# Patient Record
Sex: Female | Born: 1975
Health system: Southern US, Community
[De-identification: ages and names within clinical notes are randomized; demographics above are authoritative.]

## PROBLEM LIST (undated history)

## (undated) ENCOUNTER — Emergency Department (HOSPITAL_COMMUNITY): Admission: EM | Payer: 59 | Source: Home / Self Care

## (undated) DIAGNOSIS — IMO0002 Reserved for concepts with insufficient information to code with codable children: Secondary | ICD-10-CM

## (undated) DIAGNOSIS — Z8619 Personal history of other infectious and parasitic diseases: Secondary | ICD-10-CM

## (undated) DIAGNOSIS — F419 Anxiety disorder, unspecified: Secondary | ICD-10-CM

## (undated) DIAGNOSIS — R87619 Unspecified abnormal cytological findings in specimens from cervix uteri: Secondary | ICD-10-CM

## (undated) DIAGNOSIS — Z789 Other specified health status: Secondary | ICD-10-CM

## (undated) DIAGNOSIS — A609 Anogenital herpesviral infection, unspecified: Secondary | ICD-10-CM

## (undated) HISTORY — DX: Anogenital herpesviral infection, unspecified: A60.9

## (undated) HISTORY — DX: Anxiety disorder, unspecified: F41.9

## (undated) HISTORY — DX: Personal history of other infectious and parasitic diseases: Z86.19

## (undated) HISTORY — PX: NO PAST SURGERIES: SHX2092

## (undated) HISTORY — PX: DILATION AND CURETTAGE OF UTERUS: SHX78

---

## 1997-12-18 ENCOUNTER — Encounter: Admission: RE | Admit: 1997-12-18 | Discharge: 1997-12-18 | Payer: Self-pay | Admitting: Family Medicine

## 1998-01-01 ENCOUNTER — Encounter: Admission: RE | Admit: 1998-01-01 | Discharge: 1998-01-01 | Payer: Self-pay | Admitting: Family Medicine

## 1998-01-22 ENCOUNTER — Encounter: Admission: RE | Admit: 1998-01-22 | Discharge: 1998-01-22 | Payer: Self-pay | Admitting: Family Medicine

## 1998-04-25 ENCOUNTER — Encounter: Admission: RE | Admit: 1998-04-25 | Discharge: 1998-04-25 | Payer: Self-pay | Admitting: Family Medicine

## 1998-05-07 ENCOUNTER — Encounter: Admission: RE | Admit: 1998-05-07 | Discharge: 1998-05-07 | Payer: Self-pay | Admitting: Sports Medicine

## 1998-05-16 ENCOUNTER — Encounter: Admission: RE | Admit: 1998-05-16 | Discharge: 1998-05-16 | Payer: Self-pay | Admitting: Family Medicine

## 1998-10-10 ENCOUNTER — Encounter: Admission: RE | Admit: 1998-10-10 | Discharge: 1998-10-10 | Payer: Self-pay | Admitting: Family Medicine

## 1999-01-07 ENCOUNTER — Encounter: Admission: RE | Admit: 1999-01-07 | Discharge: 1999-01-07 | Payer: Self-pay | Admitting: Family Medicine

## 1999-01-14 ENCOUNTER — Encounter: Admission: RE | Admit: 1999-01-14 | Discharge: 1999-01-14 | Payer: Self-pay | Admitting: Family Medicine

## 2000-03-03 ENCOUNTER — Encounter (INDEPENDENT_AMBULATORY_CARE_PROVIDER_SITE_OTHER): Payer: Self-pay | Admitting: Specialist

## 2000-03-03 ENCOUNTER — Other Ambulatory Visit: Admission: RE | Admit: 2000-03-03 | Discharge: 2000-03-03 | Payer: Self-pay | Admitting: *Deleted

## 2000-10-20 ENCOUNTER — Inpatient Hospital Stay (HOSPITAL_COMMUNITY): Admission: AD | Admit: 2000-10-20 | Discharge: 2000-10-20 | Payer: Self-pay | Admitting: Obstetrics

## 2000-12-08 ENCOUNTER — Emergency Department (HOSPITAL_COMMUNITY): Admission: EM | Admit: 2000-12-08 | Discharge: 2000-12-08 | Payer: Self-pay

## 2001-03-23 ENCOUNTER — Emergency Department (HOSPITAL_COMMUNITY): Admission: EM | Admit: 2001-03-23 | Discharge: 2001-03-23 | Payer: Self-pay | Admitting: Emergency Medicine

## 2001-04-06 ENCOUNTER — Emergency Department (HOSPITAL_COMMUNITY): Admission: EM | Admit: 2001-04-06 | Discharge: 2001-04-06 | Payer: Self-pay

## 2001-04-28 ENCOUNTER — Encounter: Admission: RE | Admit: 2001-04-28 | Discharge: 2001-04-28 | Payer: Self-pay | Admitting: Obstetrics

## 2001-07-28 ENCOUNTER — Encounter: Admission: RE | Admit: 2001-07-28 | Discharge: 2001-07-28 | Payer: Self-pay | Admitting: *Deleted

## 2001-08-02 ENCOUNTER — Inpatient Hospital Stay (HOSPITAL_COMMUNITY): Admission: AD | Admit: 2001-08-02 | Discharge: 2001-08-02 | Payer: Self-pay | Admitting: *Deleted

## 2001-08-02 ENCOUNTER — Encounter: Payer: Self-pay | Admitting: *Deleted

## 2001-08-16 ENCOUNTER — Other Ambulatory Visit: Admission: RE | Admit: 2001-08-16 | Discharge: 2001-08-16 | Payer: Self-pay | Admitting: Obstetrics and Gynecology

## 2001-12-07 ENCOUNTER — Inpatient Hospital Stay (HOSPITAL_COMMUNITY): Admission: AD | Admit: 2001-12-07 | Discharge: 2001-12-07 | Payer: Self-pay | Admitting: Obstetrics and Gynecology

## 2001-12-09 ENCOUNTER — Emergency Department (HOSPITAL_COMMUNITY): Admission: EM | Admit: 2001-12-09 | Discharge: 2001-12-09 | Payer: Self-pay | Admitting: *Deleted

## 2001-12-09 ENCOUNTER — Encounter: Payer: Self-pay | Admitting: *Deleted

## 2002-01-24 ENCOUNTER — Emergency Department (HOSPITAL_COMMUNITY): Admission: EM | Admit: 2002-01-24 | Discharge: 2002-01-24 | Payer: Self-pay | Admitting: Emergency Medicine

## 2002-02-20 ENCOUNTER — Inpatient Hospital Stay (HOSPITAL_COMMUNITY): Admission: AD | Admit: 2002-02-20 | Discharge: 2002-02-20 | Payer: Self-pay | Admitting: Obstetrics and Gynecology

## 2002-02-25 ENCOUNTER — Inpatient Hospital Stay (HOSPITAL_COMMUNITY): Admission: AD | Admit: 2002-02-25 | Discharge: 2002-02-25 | Payer: Self-pay | Admitting: Obstetrics and Gynecology

## 2002-03-23 ENCOUNTER — Inpatient Hospital Stay (HOSPITAL_COMMUNITY): Admission: AD | Admit: 2002-03-23 | Discharge: 2002-03-23 | Payer: Self-pay | Admitting: Obstetrics and Gynecology

## 2002-03-23 ENCOUNTER — Inpatient Hospital Stay (HOSPITAL_COMMUNITY): Admission: AD | Admit: 2002-03-23 | Discharge: 2002-03-26 | Payer: Self-pay | Admitting: Obstetrics and Gynecology

## 2002-08-15 ENCOUNTER — Encounter: Admission: RE | Admit: 2002-08-15 | Discharge: 2002-08-15 | Payer: Self-pay | Admitting: Sports Medicine

## 2002-08-17 ENCOUNTER — Encounter (INDEPENDENT_AMBULATORY_CARE_PROVIDER_SITE_OTHER): Payer: Self-pay | Admitting: *Deleted

## 2003-02-01 ENCOUNTER — Emergency Department (HOSPITAL_COMMUNITY): Admission: EM | Admit: 2003-02-01 | Discharge: 2003-02-01 | Payer: Self-pay | Admitting: Emergency Medicine

## 2003-04-05 ENCOUNTER — Inpatient Hospital Stay (HOSPITAL_COMMUNITY): Admission: AD | Admit: 2003-04-05 | Discharge: 2003-04-05 | Payer: Self-pay | Admitting: Obstetrics & Gynecology

## 2003-12-03 ENCOUNTER — Emergency Department (HOSPITAL_COMMUNITY): Admission: EM | Admit: 2003-12-03 | Discharge: 2003-12-04 | Payer: Self-pay | Admitting: Emergency Medicine

## 2003-12-04 ENCOUNTER — Inpatient Hospital Stay (HOSPITAL_COMMUNITY): Admission: AD | Admit: 2003-12-04 | Discharge: 2003-12-04 | Payer: Self-pay | Admitting: *Deleted

## 2003-12-07 ENCOUNTER — Encounter: Payer: Self-pay | Admitting: Obstetrics and Gynecology

## 2003-12-07 ENCOUNTER — Ambulatory Visit (HOSPITAL_COMMUNITY): Admission: RE | Admit: 2003-12-07 | Discharge: 2003-12-07 | Payer: Self-pay | Admitting: Obstetrics and Gynecology

## 2003-12-07 ENCOUNTER — Encounter (INDEPENDENT_AMBULATORY_CARE_PROVIDER_SITE_OTHER): Payer: Self-pay | Admitting: Specialist

## 2004-02-21 ENCOUNTER — Inpatient Hospital Stay (HOSPITAL_COMMUNITY): Admission: AD | Admit: 2004-02-21 | Discharge: 2004-02-22 | Payer: Self-pay | Admitting: *Deleted

## 2004-03-13 ENCOUNTER — Ambulatory Visit: Payer: Self-pay | Admitting: Family Medicine

## 2004-04-15 ENCOUNTER — Inpatient Hospital Stay (HOSPITAL_COMMUNITY): Admission: AD | Admit: 2004-04-15 | Discharge: 2004-04-16 | Payer: Self-pay | Admitting: Obstetrics and Gynecology

## 2004-05-02 ENCOUNTER — Other Ambulatory Visit: Admission: RE | Admit: 2004-05-02 | Discharge: 2004-05-02 | Payer: Self-pay | Admitting: Obstetrics and Gynecology

## 2004-05-06 ENCOUNTER — Emergency Department (HOSPITAL_COMMUNITY): Admission: EM | Admit: 2004-05-06 | Discharge: 2004-05-06 | Payer: Self-pay | Admitting: Emergency Medicine

## 2004-05-21 ENCOUNTER — Inpatient Hospital Stay (HOSPITAL_COMMUNITY): Admission: AD | Admit: 2004-05-21 | Discharge: 2004-05-21 | Payer: Self-pay | Admitting: Obstetrics and Gynecology

## 2004-06-11 ENCOUNTER — Inpatient Hospital Stay (HOSPITAL_COMMUNITY): Admission: AD | Admit: 2004-06-11 | Discharge: 2004-06-12 | Payer: Self-pay | Admitting: Obstetrics and Gynecology

## 2004-07-02 ENCOUNTER — Inpatient Hospital Stay (HOSPITAL_COMMUNITY): Admission: AD | Admit: 2004-07-02 | Discharge: 2004-07-02 | Payer: Self-pay | Admitting: Obstetrics and Gynecology

## 2004-07-15 ENCOUNTER — Inpatient Hospital Stay (HOSPITAL_COMMUNITY): Admission: AD | Admit: 2004-07-15 | Discharge: 2004-07-15 | Payer: Self-pay | Admitting: Obstetrics and Gynecology

## 2004-07-16 ENCOUNTER — Inpatient Hospital Stay (HOSPITAL_COMMUNITY): Admission: AD | Admit: 2004-07-16 | Discharge: 2004-07-16 | Payer: Self-pay | Admitting: Obstetrics and Gynecology

## 2004-07-17 ENCOUNTER — Inpatient Hospital Stay (HOSPITAL_COMMUNITY): Admission: AD | Admit: 2004-07-17 | Discharge: 2004-07-18 | Payer: Self-pay | Admitting: Obstetrics and Gynecology

## 2004-07-23 ENCOUNTER — Emergency Department (HOSPITAL_COMMUNITY): Admission: EM | Admit: 2004-07-23 | Discharge: 2004-07-23 | Payer: Self-pay | Admitting: Emergency Medicine

## 2004-07-26 ENCOUNTER — Emergency Department (HOSPITAL_COMMUNITY): Admission: EM | Admit: 2004-07-26 | Discharge: 2004-07-26 | Payer: Self-pay | Admitting: Emergency Medicine

## 2004-08-06 ENCOUNTER — Inpatient Hospital Stay (HOSPITAL_COMMUNITY): Admission: AD | Admit: 2004-08-06 | Discharge: 2004-08-07 | Payer: Self-pay | Admitting: Obstetrics and Gynecology

## 2004-09-05 ENCOUNTER — Inpatient Hospital Stay (HOSPITAL_COMMUNITY): Admission: AD | Admit: 2004-09-05 | Discharge: 2004-09-06 | Payer: Self-pay | Admitting: Obstetrics and Gynecology

## 2004-09-09 ENCOUNTER — Inpatient Hospital Stay (HOSPITAL_COMMUNITY): Admission: AD | Admit: 2004-09-09 | Discharge: 2004-09-09 | Payer: Self-pay | Admitting: Obstetrics and Gynecology

## 2004-09-15 ENCOUNTER — Ambulatory Visit (HOSPITAL_COMMUNITY): Admission: RE | Admit: 2004-09-15 | Discharge: 2004-09-15 | Payer: Self-pay | Admitting: Obstetrics and Gynecology

## 2004-09-29 ENCOUNTER — Inpatient Hospital Stay (HOSPITAL_COMMUNITY): Admission: AD | Admit: 2004-09-29 | Discharge: 2004-10-01 | Payer: Self-pay | Admitting: Obstetrics and Gynecology

## 2005-12-17 ENCOUNTER — Other Ambulatory Visit: Admission: RE | Admit: 2005-12-17 | Discharge: 2005-12-17 | Payer: Self-pay | Admitting: Obstetrics and Gynecology

## 2006-08-13 ENCOUNTER — Encounter (INDEPENDENT_AMBULATORY_CARE_PROVIDER_SITE_OTHER): Payer: Self-pay | Admitting: *Deleted

## 2006-10-29 ENCOUNTER — Inpatient Hospital Stay (HOSPITAL_COMMUNITY): Admission: AD | Admit: 2006-10-29 | Discharge: 2006-10-29 | Payer: Self-pay | Admitting: Family Medicine

## 2006-12-07 ENCOUNTER — Ambulatory Visit: Payer: Self-pay | Admitting: Family Medicine

## 2006-12-07 ENCOUNTER — Encounter: Payer: Self-pay | Admitting: Family Medicine

## 2006-12-07 ENCOUNTER — Other Ambulatory Visit: Admission: RE | Admit: 2006-12-07 | Discharge: 2006-12-07 | Payer: Self-pay | Admitting: Family Medicine

## 2006-12-07 DIAGNOSIS — R5383 Other fatigue: Secondary | ICD-10-CM

## 2006-12-07 DIAGNOSIS — R5381 Other malaise: Secondary | ICD-10-CM | POA: Insufficient documentation

## 2006-12-07 LAB — CONVERTED CEMR LAB
ALT: 11 units/L (ref 0–35)
BUN: 7 mg/dL (ref 6–23)
CO2: 24 meq/L (ref 19–32)
Chloride: 103 meq/L (ref 96–112)
Creatinine, Ser: 0.78 mg/dL (ref 0.40–1.20)
Glucose, Bld: 84 mg/dL (ref 70–99)
Potassium: 4.2 meq/L (ref 3.5–5.3)
TSH: 1.305 microintl units/mL (ref 0.350–5.50)
Total Protein: 7 g/dL (ref 6.0–8.3)

## 2006-12-09 ENCOUNTER — Encounter: Payer: Self-pay | Admitting: Family Medicine

## 2007-01-03 ENCOUNTER — Telehealth (INDEPENDENT_AMBULATORY_CARE_PROVIDER_SITE_OTHER): Payer: Self-pay | Admitting: *Deleted

## 2007-01-19 ENCOUNTER — Encounter: Payer: Self-pay | Admitting: Family Medicine

## 2007-01-19 ENCOUNTER — Ambulatory Visit: Payer: Self-pay | Admitting: Family Medicine

## 2007-01-24 ENCOUNTER — Telehealth (INDEPENDENT_AMBULATORY_CARE_PROVIDER_SITE_OTHER): Payer: Self-pay | Admitting: *Deleted

## 2007-01-25 ENCOUNTER — Encounter: Payer: Self-pay | Admitting: Family Medicine

## 2007-01-25 ENCOUNTER — Ambulatory Visit: Payer: Self-pay | Admitting: Family Medicine

## 2007-01-25 LAB — CONVERTED CEMR LAB
Chlamydia, DNA Probe: NEGATIVE
GC Probe Amp, Genital: NEGATIVE
Whiff Test: NEGATIVE

## 2007-01-27 ENCOUNTER — Encounter: Payer: Self-pay | Admitting: Family Medicine

## 2007-02-01 ENCOUNTER — Ambulatory Visit: Payer: Self-pay | Admitting: Family Medicine

## 2007-02-01 DIAGNOSIS — R8789 Other abnormal findings in specimens from female genital organs: Secondary | ICD-10-CM | POA: Insufficient documentation

## 2007-02-08 ENCOUNTER — Encounter (INDEPENDENT_AMBULATORY_CARE_PROVIDER_SITE_OTHER): Payer: Self-pay | Admitting: *Deleted

## 2007-02-10 ENCOUNTER — Ambulatory Visit: Payer: Self-pay | Admitting: Family Medicine

## 2007-03-09 ENCOUNTER — Ambulatory Visit: Payer: Self-pay | Admitting: Family Medicine

## 2007-03-30 ENCOUNTER — Emergency Department (HOSPITAL_COMMUNITY): Admission: EM | Admit: 2007-03-30 | Discharge: 2007-03-30 | Payer: Self-pay | Admitting: Emergency Medicine

## 2007-03-31 ENCOUNTER — Inpatient Hospital Stay (HOSPITAL_COMMUNITY): Admission: EM | Admit: 2007-03-31 | Discharge: 2007-04-03 | Payer: Self-pay | Admitting: Emergency Medicine

## 2007-04-05 ENCOUNTER — Encounter (HOSPITAL_COMMUNITY): Admission: RE | Admit: 2007-04-05 | Discharge: 2007-06-27 | Payer: Self-pay | Admitting: Emergency Medicine

## 2007-07-15 ENCOUNTER — Telehealth: Payer: Self-pay | Admitting: *Deleted

## 2007-07-15 ENCOUNTER — Ambulatory Visit: Payer: Self-pay | Admitting: Family Medicine

## 2007-08-17 ENCOUNTER — Telehealth: Payer: Self-pay | Admitting: *Deleted

## 2007-08-22 ENCOUNTER — Ambulatory Visit: Payer: Self-pay | Admitting: Family Medicine

## 2007-08-22 LAB — CONVERTED CEMR LAB: Beta hcg, urine, semiquantitative: POSITIVE

## 2007-09-28 ENCOUNTER — Ambulatory Visit: Payer: Self-pay | Admitting: Family Medicine

## 2007-10-06 ENCOUNTER — Telehealth (INDEPENDENT_AMBULATORY_CARE_PROVIDER_SITE_OTHER): Payer: Self-pay | Admitting: Family Medicine

## 2007-10-21 ENCOUNTER — Telehealth (INDEPENDENT_AMBULATORY_CARE_PROVIDER_SITE_OTHER): Payer: Self-pay | Admitting: *Deleted

## 2007-10-24 ENCOUNTER — Ambulatory Visit: Payer: Self-pay | Admitting: Family Medicine

## 2007-10-24 LAB — CONVERTED CEMR LAB: Beta hcg, urine, semiquantitative: NEGATIVE

## 2007-10-25 ENCOUNTER — Telehealth: Payer: Self-pay | Admitting: *Deleted

## 2007-10-31 ENCOUNTER — Encounter: Payer: Self-pay | Admitting: *Deleted

## 2007-12-12 ENCOUNTER — Encounter: Payer: Self-pay | Admitting: *Deleted

## 2007-12-25 ENCOUNTER — Emergency Department (HOSPITAL_COMMUNITY): Admission: EM | Admit: 2007-12-25 | Discharge: 2007-12-25 | Payer: Self-pay | Admitting: Family Medicine

## 2008-02-06 ENCOUNTER — Telehealth: Payer: Self-pay | Admitting: *Deleted

## 2008-02-06 ENCOUNTER — Ambulatory Visit: Payer: Self-pay | Admitting: Sports Medicine

## 2008-02-06 ENCOUNTER — Encounter: Payer: Self-pay | Admitting: Family Medicine

## 2008-02-07 LAB — CONVERTED CEMR LAB
Chlamydia, DNA Probe: NEGATIVE
GC Probe Amp, Genital: NEGATIVE

## 2008-02-13 ENCOUNTER — Encounter: Payer: Self-pay | Admitting: Family Medicine

## 2008-02-13 ENCOUNTER — Ambulatory Visit: Payer: Self-pay | Admitting: Family Medicine

## 2008-02-13 ENCOUNTER — Other Ambulatory Visit: Admission: RE | Admit: 2008-02-13 | Discharge: 2008-02-13 | Payer: Self-pay | Admitting: Family Medicine

## 2008-02-21 ENCOUNTER — Telehealth: Payer: Self-pay | Admitting: *Deleted

## 2008-02-23 ENCOUNTER — Encounter: Payer: Self-pay | Admitting: Family Medicine

## 2008-02-26 ENCOUNTER — Emergency Department (HOSPITAL_COMMUNITY): Admission: EM | Admit: 2008-02-26 | Discharge: 2008-02-26 | Payer: Self-pay | Admitting: Family Medicine

## 2008-04-03 ENCOUNTER — Telehealth (INDEPENDENT_AMBULATORY_CARE_PROVIDER_SITE_OTHER): Payer: Self-pay | Admitting: *Deleted

## 2008-04-05 ENCOUNTER — Ambulatory Visit: Payer: Self-pay | Admitting: Family Medicine

## 2008-04-05 ENCOUNTER — Telehealth (INDEPENDENT_AMBULATORY_CARE_PROVIDER_SITE_OTHER): Payer: Self-pay | Admitting: *Deleted

## 2008-06-15 HISTORY — PX: COLPOSCOPY: SHX161

## 2009-02-22 ENCOUNTER — Telehealth: Payer: Self-pay | Admitting: *Deleted

## 2009-02-28 ENCOUNTER — Emergency Department (HOSPITAL_COMMUNITY): Admission: EM | Admit: 2009-02-28 | Discharge: 2009-02-28 | Payer: Self-pay | Admitting: Emergency Medicine

## 2009-04-04 ENCOUNTER — Other Ambulatory Visit: Admission: RE | Admit: 2009-04-04 | Discharge: 2009-04-04 | Payer: Self-pay | Admitting: Family Medicine

## 2009-04-04 ENCOUNTER — Encounter: Payer: Self-pay | Admitting: Family Medicine

## 2009-04-04 ENCOUNTER — Ambulatory Visit: Payer: Self-pay | Admitting: Family Medicine

## 2009-04-05 ENCOUNTER — Encounter: Payer: Self-pay | Admitting: Family Medicine

## 2009-04-17 ENCOUNTER — Telehealth: Payer: Self-pay | Admitting: Family Medicine

## 2009-04-17 ENCOUNTER — Ambulatory Visit: Payer: Self-pay | Admitting: Family Medicine

## 2009-05-08 ENCOUNTER — Telehealth: Payer: Self-pay | Admitting: Family Medicine

## 2009-07-10 ENCOUNTER — Emergency Department (HOSPITAL_COMMUNITY): Admission: EM | Admit: 2009-07-10 | Discharge: 2009-07-10 | Payer: Self-pay | Admitting: Emergency Medicine

## 2009-08-16 ENCOUNTER — Telehealth: Payer: Self-pay | Admitting: Family Medicine

## 2009-11-18 ENCOUNTER — Telehealth: Payer: Self-pay | Admitting: Family Medicine

## 2009-12-25 ENCOUNTER — Ambulatory Visit: Payer: Self-pay | Admitting: Family Medicine

## 2009-12-25 ENCOUNTER — Telehealth: Payer: Self-pay | Admitting: Family Medicine

## 2010-02-05 ENCOUNTER — Telehealth: Payer: Self-pay | Admitting: Family Medicine

## 2010-02-06 ENCOUNTER — Ambulatory Visit: Payer: Self-pay | Admitting: Family Medicine

## 2010-02-06 ENCOUNTER — Encounter: Payer: Self-pay | Admitting: Family Medicine

## 2010-02-06 DIAGNOSIS — N760 Acute vaginitis: Secondary | ICD-10-CM | POA: Insufficient documentation

## 2010-02-06 DIAGNOSIS — N898 Other specified noninflammatory disorders of vagina: Secondary | ICD-10-CM | POA: Insufficient documentation

## 2010-02-06 LAB — CONVERTED CEMR LAB
Beta hcg, urine, semiquantitative: NEGATIVE
Bilirubin Urine: NEGATIVE
Nitrite: NEGATIVE
Protein, U semiquant: NEGATIVE
Urobilinogen, UA: 2
WBC Urine, dipstick: NEGATIVE
Whiff Test: NEGATIVE
pH: 7

## 2010-02-12 ENCOUNTER — Telehealth: Payer: Self-pay | Admitting: Family Medicine

## 2010-07-08 ENCOUNTER — Inpatient Hospital Stay (HOSPITAL_COMMUNITY)
Admission: AD | Admit: 2010-07-08 | Discharge: 2010-07-08 | Payer: Self-pay | Source: Home / Self Care | Attending: Obstetrics & Gynecology | Admitting: Obstetrics & Gynecology

## 2010-07-09 LAB — WET PREP, GENITAL
Trich, Wet Prep: NONE SEEN
Yeast Wet Prep HPF POC: NONE SEEN

## 2010-07-09 LAB — URINALYSIS, ROUTINE W REFLEX MICROSCOPIC
Bilirubin Urine: NEGATIVE
Nitrite: NEGATIVE
Specific Gravity, Urine: <1.005 — ABNORMAL LOW (ref 1.005–1.030)
Urobilinogen, UA: 0.2 mg/dL (ref 0.0–1.0)

## 2010-07-09 LAB — GC/CHLAMYDIA PROBE AMP, GENITAL: Chlamydia, DNA Probe: NEGATIVE

## 2010-07-09 LAB — POCT PREGNANCY, URINE: Preg Test, Ur: POSITIVE

## 2010-07-10 ENCOUNTER — Ambulatory Visit (HOSPITAL_COMMUNITY)
Admission: RE | Admit: 2010-07-10 | Discharge: 2010-07-10 | Payer: Self-pay | Source: Home / Self Care | Attending: Obstetrics & Gynecology | Admitting: Obstetrics & Gynecology

## 2010-07-15 NOTE — Assessment & Plan Note (Signed)
Summary: std check-see notes/[place]   Vital Signs:  Patient profile:   35 year old female LMP:     01/26/2010 Weight:      112.3 pounds Temp:     98.3 degrees F oral Pulse rate:   71 / minute Pulse rhythm:   regular BP sitting:   106 / 68  (left arm) Cuff size:   regular  Vitals Entered By: Loralee Pacas CMA (February 06, 2010 10:11 AM) Comments pt stated that when she started her period last week she used a scented tampon and pantyliner and believes thats why she is irritated LMP (date): 01/26/2010     Enter LMP: 01/26/2010 Last PAP Result NEGATIVE FOR INTRAEPITHELIAL LESIONS OR MALIGNANCY.   Primary Care Provider:  Pearlean Brownie MD   History of Present Illness: Patient seen for work-in appt; recently treated empirically for Chlamydia (with doxy) after boyfriend notified her that he'd been diagnosed.  She acknowledges today that she did not fill the doxy Rx after she learned that her cervical culture was negative.  SHe has had sexual contact with him since he was treated.  Today she complains of vaginal discomfort and discharge, tinged with pink coloration, since Aug 14th.  Had scant vaginal bleeding then.  Was using tampons that are scented and believes she has had external irritation afterward.  No history genital herpes or condyloma.  Denies dysuria, or urinary frequency.  Denies breast tenderness, has had some recent nausea.  Had been on OCPs until about 2 months ago, when she began to forget to take them.  States she does not wish to be pregnant.    Habits & Providers  Alcohol-Tobacco-Diet     Tobacco Status: never     Tobacco Counseling: not indicated; no tobacco use  Current Medications (verified): 1)  Tri-Sprintec 0.035 Mg Tabs (Norgestimate-Ethinyl Estradiol) .Marland Kitchen.. 1 Daily 28 Day Pack  Allergies (verified): No Known Drug Allergies  Physical Exam  General:  well appearing, no apparent distress. Abdomen:  soft, nontender Genitalia:  Normal introitus for  age, no external lesions,fair amount of creamy vaginal discharge, mucosa pink and moist, no vaginal or cervical lesions, no vaginal atrophy, no friaility or hemorrhage. No cervical motion tenderness with insertion swab in os, or with collection wet prep on cervix.   Impression & Recommendations:  Problem # 1:  VAGINAL DISCHARGE (ICD-623.5) Patient with recent exposure to STI, continues to be sexually active with boyfriend (who was reportedly diagnosed).  Patient did not take doxy after last visit, but has developed symptoms of discharge in the past week and a half.  For DOT with azithro 1g by mouth, and cefriaxone 250mg  IM x1 today in the office. Wet prep, UPreg both negative.  Counseled on abstinence vs condom use to prevent pregnancy/STI transmission.  She reports being forgetful with OCP and not taking regularly in the past 2 months, uses condoms sporadically.  To follow up with PCP regarding pregnancy prevention.  Orders: U Preg-[facility] (81025) Urinalysis-[facility] (00000)  Complete Medication List: 1)  Tri-sprintec 0.035 Mg Tabs (Norgestimate-ethinyl estradiol) .Marland Kitchen.. 1 daily 28 day pack  Other Orders: Wet Prep- [facility] (44034) GC/Chlamydia-[facility] (87591/87491) Rocephin  250mg  (V4259) Azithromycin oral (Q0144) [facility]- Est Level  3 (56387)  Patient Instructions: 1)  It was a pleasure to meet you today.   2)  Given your recent exposure to an infection, we are treating you today with both an injectable antibiotic and an oral antibiotic (taken in one dose in the office).   3)  Our  testing for yeast and Bacterial Vaginosis was negative.  The pregnancy test was negative today.  4)  In order to prevent exposure to sexually transmitted diseases and possible pregnancy, it is important to either not have sex, or to use a condom every time. 5)  I would like you to see Dr Deirdre Priest for followup, and to discuss how to help prevent pregnancy in the future.   Laboratory Results   Urine Tests  Date/Time Received:  February 06, 2010 10:53 AM  Date/Time Reported: February 06, 2010 11:04 AM   Routine Urinalysis   Color: yellow Appearance: Clear Glucose: negative   (Normal Range: Negative) Bilirubin: negative   (Normal Range: Negative) Ketone: negative   (Normal Range: Negative) Spec. Gravity: >=1.030   (Normal Range: 1.003-1.035) Blood: negative   (Normal Range: Negative) pH: 7.0   (Normal Range: 5.0-8.0) Protein: negative   (Normal Range: Negative) Urobilinogen: 2.0   (Normal Range: 0-1) Nitrite: negative   (Normal Range: Negative) Leukocyte Esterace: negative   (Normal Range: Negative)    Urine HCG: negative Comments: ...........test performed by...........Marland KitchenTerese Door, CMA  Date/Time Received: February 06, 2010 10:15 AM  Date/Time Reported: February 06, 2010 10:56 AM   [facility] Source: vaginal WBC/hpf: 0-3 Bacteria/hpf: 3+  Rods Clue cells/hpf: none  Negative whiff Yeast/hpf: none Trichomonas/hpf: none Comments: ...........test performed by...........Marland KitchenTerese Door, CMA      Medication Administration  Injection # 1:    Medication: Rocephin  250mg     Diagnosis: UNSPECIFIED VAGINITIS AND VULVOVAGINITIS (ICD-616.10)    Route: IM    Site: RUOQ gluteus    Exp Date: 12/13/2010    Lot #: [id number]    Mfr: [facility]    Comments: pt to wait before leaving    Patient tolerated injection without complications    Given by: Loralee Pacas CMA (February 06, 2010 11:06 AM)  Medication # 1:    Medication: Azithromycin oral    Diagnosis: UNSPECIFIED VAGINITIS AND VULVOVAGINITIS (ICD-616.10)    Dose: 1 g    Route: po    Exp Date: 09/14/2011    Lot #: [id number]    Mfr: [facility]    Patient tolerated medication without complications    Given by: Loralee Pacas CMA (February 06, 2010 11:08 AM)  Orders Added: 1)  Wet Prep- [facility] [87210] 2)  U Preg-[facility] [81025] 3)  Urinalysis-[facility] [00000] 4)  GC/Chlamydia-[facility] [87591/87491] 5)  Rocephin  250mg  [J0696] 6)  Azithromycin oral  [Q0144] 7)  [facility]- Est Level  3 [75643]

## 2010-07-15 NOTE — Progress Notes (Signed)
  Phone Note Outgoing Call   Call placed by: Paula Compton MD,  February 12, 2010 5:25 PM Call placed to: Patient Summary of Call: Called to inform of negative cervical cultures.  The discharge for whcih she was seen last week (and treated empirically) has resolved.  Will follow with her primary for other concerns.  Initial call taken by: Paula Compton MD,  February 12, 2010 5:25 PM

## 2010-07-15 NOTE — Assessment & Plan Note (Signed)
Summary: std/[place]   Vital Signs:  Patient profile:   35 year old female Height:      65 inches Weight:      120 pounds BMI:     20.04 BSA:     1.59 Temp:     98.6 degrees F Pulse rate:   76 / minute BP sitting:   116 / 78  Vitals Entered By: Delora Fuel (December 25, 2009 10:22 AM) CC: STD test Is Patient Diabetic? No Pain Assessment Patient in pain? yes        Primary Care Provider:  Pearlean Brownie MD  CC:  STD test.  History of Present Illness: STD exposure her boyfriend called yeserday and said he had chlamydia.  She has no symptoms of vaginal discharge or sores or pain.  She had an std check at [facility] in June - all negative  ROS - as above PMH - Medications reviewed and updated in medication list.  Smoking Status noted in VS form    Habits & Providers  Alcohol-Tobacco-Diet     Tobacco Status: never  Current Medications (verified): 1)  Tri-Sprintec 0.035 Mg Tabs (Norgestimate-Ethinyl Estradiol) .Marland Kitchen.. 1 Daily 28 Day Pack 2)  Doxycycline Hyclate 100 Mg Solr (Doxycycline Hyclate) .Marland Kitchen.. 1 Two Times A Day For 7 Days  Allergies: No Known Drug Allergies  Social History: Has dtr Kyung Rudd born Fall 03 and son Penni Bombard born 2006.  Finished Med tech degree in 2011.     Impression & Recommendations:  Problem # 1:  SEXUALLY TRANSMITTED DISEASE, EXPOSURE TO (ICD-V01.6) treat for chlamydia.  Cultures done.  Will not do rpr or hiv since just had in June.  Told her if he tests positive for anything else she should contact us  Orders: GC/Chlamydia-[facility] (87591/87491) [facility]- Est Level  3 (16109)  Complete Medication List: 1)  Tri-sprintec 0.035 Mg Tabs (Norgestimate-ethinyl estradiol) .Marland Kitchen.. 1 daily 28 day pack 2)  Doxycycline Hyclate 100 Mg Solr (Doxycycline hyclate) .Marland Kitchen.. 1 two times a day for 7 days  Patient Instructions: 1)  Take all your antibiotics  2)  Use condoms for std protection 3)  If you develop vaginal discharge or pain or do not have a menstrual period with  your next pill pack come back 4)  You need a pap smear in October Prescriptions: DOXYCYCLINE HYCLATE 100 MG SOLR (DOXYCYCLINE HYCLATE) 1 two times a day for 7 days  #14 x 0   Entered and Authorized by:   Pearlean Brownie MD   Signed by:   Pearlean Brownie MD on 12/25/2009   Method used:   Electronically to        [facility] [address] [phone]* (retail)       [address]       [place], [place]  [address]       Ph: [phone] or [phone]       Fax: [phone]   RxID:   [phone]

## 2010-07-15 NOTE — Progress Notes (Signed)
Summary: wi request  Phone Note Call from Patient Call back at Home Phone [phone]   Reason for Call: Talk to Nurse Summary of Call: pts boyfriend told her yesterday he had an std, pt needs to be seen to be tested/treated Initial call taken by: Knox Royalty,  December 25, 2009 8:58 AM  Follow-up for Phone Call        states he told her she has chlmydia. work in with pcp this am. other pt in that time slot has cancelled Follow-up by: Golden Circle RN,  December 25, 2009 9:10 AM

## 2010-07-15 NOTE — Progress Notes (Signed)
Summary: triage  Phone Note Call from Patient Call back at Home Phone [phone]   Caller: Patient Summary of Call: Pt had come in and had gotten rx for an STD that she thought her boyfriend might have given her.  She got the medication filled, but never took it.  Now feels like she needs it.  Can she get another rx for the medication? Initial call taken by: Clydell Hakim,  February 05, 2010 11:04 AM  Follow-up for Phone Call        LM Follow-up by: Golden Circle RN,  February 05, 2010 11:18 AM  Additional Follow-up for Phone Call Additional follow up Details #1::        pt returned call Additional Follow-up by: De Nurse,  February 05, 2010 11:39 AM    Additional Follow-up for Phone Call Additional follow up Details #2::    states she did get the pills but has lost them. says she feels "funny down there" wants to be checked for std & get another rx. appt made in am at 10:30 Follow-up by: Golden Circle RN,  February 05, 2010 12:08 PM

## 2010-07-15 NOTE — Progress Notes (Signed)
Summary: RX  Phone Note Refill Request Call back at Home Phone [phone]   Refills Requested: Medication #1:  TRI-SPRINTEC 0.035 MG TABS 1 daily 28 day pack. pt goes to [address] [address]  Initial call taken by: Knox Royalty,  November 18, 2009 8:57 AM    Prescriptions: TRI-SPRINTEC 0.035 MG TABS (NORGESTIMATE-ETHINYL ESTRADIOL) 1 daily 28 day pack  #1 x 11   Entered and Authorized by:   Pearlean Brownie MD   Signed by:   Pearlean Brownie MD on 11/18/2009   Method used:   Electronically to        [facility] [address] [phone]* (retail)       [address]       [place], [place]  [address]       Ph: [phone] or [phone]       Fax: [phone]   RxID:   [id number]
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
## 2010-08-09 ENCOUNTER — Inpatient Hospital Stay (HOSPITAL_COMMUNITY)
Admission: AD | Admit: 2010-08-09 | Discharge: 2010-08-09 | Disposition: A | Discharge status: Home / Self Care | Payer: Medicaid Other | Source: Ambulatory Visit | Attending: Obstetrics & Gynecology | Admitting: Obstetrics & Gynecology

## 2010-08-09 DIAGNOSIS — B373 Candidiasis of vulva and vagina: Secondary | ICD-10-CM | POA: Insufficient documentation

## 2010-08-09 DIAGNOSIS — R109 Unspecified abdominal pain: Secondary | ICD-10-CM

## 2010-08-09 DIAGNOSIS — O2390 Unspecified genitourinary tract infection in pregnancy, unspecified trimester: Secondary | ICD-10-CM | POA: Insufficient documentation

## 2010-08-09 DIAGNOSIS — B3731 Acute candidiasis of vulva and vagina: Secondary | ICD-10-CM | POA: Insufficient documentation

## 2010-08-09 LAB — URINALYSIS, ROUTINE W REFLEX MICROSCOPIC
Bilirubin Urine: NEGATIVE
Hgb urine dipstick: NEGATIVE
Nitrite: NEGATIVE
Protein, ur: NEGATIVE mg/dL
Specific Gravity, Urine: >1.03 — ABNORMAL HIGH (ref 1.005–1.030)
Urine Glucose, Fasting: NEGATIVE mg/dL
Urobilinogen, UA: 0.2 mg/dL (ref 0.0–1.0)
pH: 6 (ref 5.0–8.0)

## 2010-08-11 LAB — GC/CHLAMYDIA PROBE AMP, GENITAL: Chlamydia, DNA Probe: NEGATIVE

## 2010-08-26 ENCOUNTER — Encounter: Payer: Self-pay | Admitting: *Deleted

## 2010-09-09 ENCOUNTER — Other Ambulatory Visit: Payer: Self-pay | Admitting: Obstetrics & Gynecology

## 2010-09-09 LAB — ABO/RH

## 2010-09-09 LAB — RPR: RPR: NONREACTIVE

## 2010-09-09 LAB — HEPATITIS B SURFACE ANTIGEN: Hepatitis B Surface Ag: NEGATIVE

## 2010-09-17 ENCOUNTER — Inpatient Hospital Stay (HOSPITAL_COMMUNITY)
Admission: AD | Admit: 2010-09-17 | Discharge: 2010-09-17 | Disposition: A | Discharge status: Home / Self Care | Payer: Medicaid Other | Source: Ambulatory Visit | Attending: Obstetrics and Gynecology | Admitting: Obstetrics and Gynecology

## 2010-09-17 DIAGNOSIS — O99891 Other specified diseases and conditions complicating pregnancy: Secondary | ICD-10-CM | POA: Insufficient documentation

## 2010-09-17 DIAGNOSIS — L253 Unspecified contact dermatitis due to other chemical products: Secondary | ICD-10-CM | POA: Insufficient documentation

## 2010-09-17 LAB — DIFFERENTIAL
Basophils Absolute: 0 10*3/uL (ref 0.0–0.1)
Basophils Relative: 0 % (ref 0–1)
Eosinophils Absolute: 0.4 10*3/uL (ref 0.0–0.7)
Eosinophils Relative: 4 % (ref 0–5)
Lymphocytes Relative: 15 % (ref 12–46)

## 2010-09-17 LAB — CBC
HCT: 35.8 % — ABNORMAL LOW (ref 36.0–46.0)
Hemoglobin: 12.2 g/dL (ref 12.0–15.0)
MCV: 93.5 fL (ref 78.0–100.0)
RDW: 12.7 % (ref 11.5–15.5)
WBC: 10.3 10*3/uL (ref 4.0–10.5)

## 2010-10-09 ENCOUNTER — Inpatient Hospital Stay (HOSPITAL_COMMUNITY)
Admission: AD | Admit: 2010-10-09 | Discharge: 2010-10-11 | DRG: 781 | Disposition: A | Discharge status: Home / Self Care | Payer: Medicaid Other | Source: Ambulatory Visit | Attending: Obstetrics and Gynecology | Admitting: Obstetrics and Gynecology

## 2010-10-09 DIAGNOSIS — B9789 Other viral agents as the cause of diseases classified elsewhere: Secondary | ICD-10-CM | POA: Diagnosis present

## 2010-10-09 DIAGNOSIS — R51 Headache: Secondary | ICD-10-CM | POA: Diagnosis present

## 2010-10-09 DIAGNOSIS — O99891 Other specified diseases and conditions complicating pregnancy: Principal | ICD-10-CM | POA: Diagnosis present

## 2010-10-09 LAB — CBC
HCT: 35.8 % — ABNORMAL LOW (ref 36.0–46.0)
Hemoglobin: 11.8 g/dL — ABNORMAL LOW (ref 12.0–15.0)
MCH: 31.4 pg (ref 26.0–34.0)
MCHC: 33 g/dL (ref 30.0–36.0)
MCV: 95.2 fL (ref 78.0–100.0)
RBC: 3.76 MIL/uL — ABNORMAL LOW (ref 3.87–5.11)

## 2010-10-09 LAB — COMPREHENSIVE METABOLIC PANEL
AST: 16 U/L (ref 0–37)
CO2: 27 mEq/L (ref 19–32)
Chloride: 103 mEq/L (ref 96–112)
Creatinine, Ser: 0.61 mg/dL (ref 0.4–1.2)
GFR calc Af Amer: >60 mL/min (ref >60)
GFR calc non Af Amer: >60 mL/min (ref >60)
Glucose, Bld: 84 mg/dL (ref 70–99)
Total Bilirubin: 1.1 mg/dL (ref 0.3–1.2)

## 2010-10-09 LAB — URINALYSIS, ROUTINE W REFLEX MICROSCOPIC
Bilirubin Urine: NEGATIVE
Glucose, UA: NEGATIVE mg/dL
Hgb urine dipstick: NEGATIVE
Specific Gravity, Urine: 1.025 (ref 1.005–1.030)

## 2010-10-09 LAB — MONONUCLEOSIS SCREEN: Mono Screen: NEGATIVE

## 2010-10-09 LAB — RPR: RPR Ser Ql: NONREACTIVE

## 2010-10-09 LAB — DIFFERENTIAL
Lymphocytes Relative: 14 % (ref 12–46)
Lymphs Abs: 1.8 10*3/uL (ref 0.7–4.0)
Monocytes Absolute: 1.1 10*3/uL — ABNORMAL HIGH (ref 0.1–1.0)
Monocytes Relative: 8 % (ref 3–12)
Neutro Abs: 10.1 10*3/uL — ABNORMAL HIGH (ref 1.7–7.7)
Neutrophils Relative %: 77 % (ref 43–77)

## 2010-10-10 ENCOUNTER — Inpatient Hospital Stay (HOSPITAL_COMMUNITY): Payer: Medicaid Other

## 2010-10-10 ENCOUNTER — Encounter (HOSPITAL_COMMUNITY): Payer: Self-pay | Admitting: Radiology

## 2010-10-10 LAB — CBC
HCT: 30.4 % — ABNORMAL LOW (ref 36.0–46.0)
MCHC: 32.6 g/dL (ref 30.0–36.0)
MCV: 95.9 fL (ref 78.0–100.0)
Platelets: 243 10*3/uL (ref 150–400)
RDW: 13 % (ref 11.5–15.5)

## 2010-10-10 NOTE — Consult Note (Signed)
  NAMEBRITANI, Knight               ACCOUNT NO.:  [id number]  MEDICAL RECORD NO.:  [id number]           PATIENT TYPE:  O  LOCATION:  [facility]                         [facility]  PHYSICIAN:  Lydiann Bonifas H. Tenny Craw, MD     DATE OF BIRTH:  [birth year]  DATE OF CONSULTATION:  09/17/2010 DATE OF DISCHARGE:  09/17/2010                                CONSULTATION   CHIEF COMPLAINT:  Scalp rash.  HISTORY OF PRESENT ILLNESS:  Dana Knight is a 35 year old African American female who is approximately 49 weeks' pregnant who placed a chemical relaxer on her hair on Sunday evening as she had done on multiple previous occasions.  Over the next several days, she had noticed increased itching of her scalp and noticed that her face started to swell.  She has also noticed that she is having difficulty turning her neck and moving her neck due to pain on either side of her neck. She denies headaches.  She denies vision changes, denies nausea or vomiting, fevers or chills.  She has felt some intermittent fetal movement.  PHYSICAL EXAMINATION:  GENERAL:  She is alert and oriented x3. NECK:  Range of motion of her neck is limited secondary to discomfort. She does have swollen cervical lymphadenopathy that is tender to palpation. HEENT:  She does have notable erythema with very small fasciculations, bordering her hairline all the way around.  She does have erythema of her skin on her ear.  The scalp itself in areas is weeping of clear serous-appearing fluid.  The hair itself is matted to the scalp.  There is no obvious purulence to the scalp and no visible open lesions.  Fetal heart tones were auscultated.  ASSESSMENT AND PLAN:  Chemical dermatitis secondary to chemical relaxer. We will plan to have the patient use Derma-Smoothe oil applied to the scalp daily for 2 weeks.  The patient should monitor symptoms and if within 24-48 hours of application of the Derma-Smoothe if she is noticing worsening of her  symptoms, then she should call or come to be evaluated.  We will obtain a CBC as baseline.  Otherwise, she should continue her prenatal care as already scheduled.     Freddrick March. Tenny Craw, MD     KHR/MEDQ  D:  09/17/2010  T:  09/18/2010  Job:  [id number]  Electronically Signed by Waynard Reeds MD on 10/10/2010 10:46:47 AM

## 2010-10-11 LAB — DIFFERENTIAL
Basophils Relative: 0 % (ref 0–1)
Eosinophils Absolute: 0.3 10*3/uL (ref 0.0–0.7)
Monocytes Absolute: 1.2 10*3/uL — ABNORMAL HIGH (ref 0.1–1.0)
Monocytes Relative: 12 % (ref 3–12)
Neutrophils Relative %: 71 % (ref 43–77)

## 2010-10-11 LAB — CBC
MCH: 31.1 pg (ref 26.0–34.0)
MCHC: 32.8 g/dL (ref 30.0–36.0)
Platelets: 237 10*3/uL (ref 150–400)

## 2010-10-11 LAB — COMPREHENSIVE METABOLIC PANEL
AST: 13 U/L (ref 0–37)
Albumin: 2.3 g/dL — ABNORMAL LOW (ref 3.5–5.2)
Calcium: 8.4 mg/dL (ref 8.4–10.5)
Creatinine, Ser: 0.49 mg/dL (ref 0.4–1.2)
GFR calc Af Amer: >60 mL/min (ref >60)
Total Protein: 4.9 g/dL — ABNORMAL LOW (ref 6.0–8.3)

## 2010-10-11 NOTE — Consult Note (Signed)
Dana Knight, Dana Knight NO.:  [id number]  MEDICAL RECORD NO.:  [id number]           PATIENT TYPE:  I  LOCATION:  9308                          [facility]ILITY[facility]  PHYSICIAN:  Thana Farr, MD    DATE OF BIRTH:  17-Jul-1975  DATE OF CONSULTATION:  10/10/2010 DATE OF DISCHARGE:                                CONSULTATION   CONSULT CALLED BY:  Carrington Clamp, MD  HISTORY:  Dana Knight is a 35 year old female that reports around Tuesday of this week, began to have a severe headache.  Described the headache as frontal and bitemporal.  Associated with nausea, vomiting, photophobia, and phonophobia.  The patient also developed headache and stiff neck.  Headache was not severe initially, but continued to increase in intensity until presentation on October 09, 2010.  The patient reports that the headache is worse when she is up and moving around and less severe when she is lying down.  On initial presentation, the patient was febrile and had an increased white count.  Bacterial meningitis was high on the differential.  LP was attempted twice by Anesthesia and was unable to be performed.  Consult called for further recommendations and possible LP procedure.  PAST MEDICAL HISTORY:  Noncontributory.  SOCIAL HISTORY:  The patient has no history of alcohol, tobacco, or illicit drug abuse.  MEDICATIONS:  Prenatal vitamins, Dilaudid, Colace.  PHYSICAL EXAMINATION:  VITAL SIGNS:  Blood pressure 116/73, heart rate 94, respiratory rate 20, T-max 98.5. MENTAL STATUS TESTING:  The patient is alert and oriented.  She was actually in the shower when I entered the room.  Speech is fluent.  The patient follows commands without difficulty.  On cranial nerve testing: II disks flat bilaterally, visual fields grossly intact.  III, IV, VI; extraocular movements intact.  V and VII, smile symmetric.  VIII, grossly intact.  IX and X, positive gag.  XI, bilateral shoulder shrug and XII,  midline tongue extension.  On motor exam, the patient is 5/5 throughout.  There is normal tone and bulk.  Sensory, pinprick and light touch are intact bilaterally.  Deep tendon reflexes are 2+ throughout. Plantars are downgoing bilaterally.  On cerebellar testing, finger-to- nose and heel-to-shin intact.  Laboratory data shows a white blood cell count of 8.3 down from 13.1 yesterday, platelet count 243, hemoglobin and hematocrit 9.9 and 30.4 respectively.  Sodium 136, potassium 4.1, chloride 103, bicarb 27, BUN and creatinine 3 and 0.51 respectively, glucose 84.  Total bili 1.1, alk phos 42.  SGOT, SGPT 15 and 16, protein 6.7, albumin 3.2, calcium 8.9. Urinalysis unremarkable.  RPR nonreactive.  Mono screen negative.  ASSESSMENT:  Dana Knight is a 35 year old female that presents with severe headache.  Her initial presentation did suggest the possibility of a meningitis, but she is now 24 hours out from her presentation and actually has an improved white count, is afebrile, and has not had any decompensation in her functional status.  All of this without any antibiotics.  This makes bacterial meningitis extremely unlikely and very low on the differential.  There is although a possibility of a viral meningitis.  The patient does have many of those symptoms. Treatment is really not indicated other than symptomatically for viral meningitis.  Therefore, to attempt another LP at this time would not add to her treatment or improve her prognosis.  I would not recommend that at this time.  I would recommend ruling out any any further intracranial processes with imaging though.  PLAN: 1. Continue pain management.  The patient may very well have continued     headaches and may have them on and off for some time.  The patient     has been made aware.  Due to her pregnancy, limited in terms of     what pain medications can be used and we will leave this to her     OB/GYN to manage. 2. The patient  does require imaging.  She may have a head CT or MRI of     the brain without contrast.  Either is acceptable.  If     unremarkable, no further testing recommended at this time.  Case discussed with Dr. Tenny Craw.          ______________________________ Thana Farr, MD     LR/MEDQ  D:  10/10/2010  T:  10/11/2010  Job:  [id number]  Electronically Signed by Thana Farr MD on 10/11/2010 02:57:28 PM

## 2010-10-12 ENCOUNTER — Emergency Department (HOSPITAL_COMMUNITY)
Admission: EM | Admit: 2010-10-12 | Discharge: 2010-10-12 | Disposition: A | Discharge status: Home / Self Care | Payer: Medicaid Other | Attending: Emergency Medicine | Admitting: Emergency Medicine

## 2010-10-12 DIAGNOSIS — R51 Headache: Secondary | ICD-10-CM | POA: Insufficient documentation

## 2010-10-12 DIAGNOSIS — O99891 Other specified diseases and conditions complicating pregnancy: Secondary | ICD-10-CM | POA: Insufficient documentation

## 2010-10-12 DIAGNOSIS — R109 Unspecified abdominal pain: Secondary | ICD-10-CM | POA: Insufficient documentation

## 2010-10-12 LAB — URINALYSIS, ROUTINE W REFLEX MICROSCOPIC
Bilirubin Urine: NEGATIVE
Glucose, UA: NEGATIVE mg/dL
Hgb urine dipstick: NEGATIVE
Ketones, ur: NEGATIVE mg/dL
Nitrite: NEGATIVE
pH: 8 (ref 5.0–8.0)

## 2010-10-12 LAB — POCT I-STAT, CHEM 8
Calcium, Ion: 1.16 mmol/L (ref 1.12–1.32)
Chloride: 104 mEq/L (ref 96–112)
Glucose, Bld: 82 mg/dL (ref 70–99)
HCT: 32 % — ABNORMAL LOW (ref 36.0–46.0)

## 2010-10-12 LAB — DIFFERENTIAL
Eosinophils Relative: 2 % (ref 0–5)
Lymphocytes Relative: 14 % (ref 12–46)
Lymphs Abs: 1.3 10*3/uL (ref 0.7–4.0)
Monocytes Absolute: 0.7 10*3/uL (ref 0.1–1.0)

## 2010-10-12 LAB — CBC
HCT: 32.6 % — ABNORMAL LOW (ref 36.0–46.0)
MCV: 93.7 fL (ref 78.0–100.0)
RDW: 12.8 % (ref 11.5–15.5)
WBC: 9.5 10*3/uL (ref 4.0–10.5)

## 2010-10-13 ENCOUNTER — Telehealth: Payer: Self-pay | Admitting: Family Medicine

## 2010-10-13 LAB — HSV(HERPES SMPLX)ABS-I+II(IGG+IGM)-BLD: Herpes Simplex Vrs I&II-IgM Ab (EIA): 0.41 INDEX

## 2010-10-13 NOTE — Telephone Encounter (Signed)
Pt is following Dr. Aldona Bar at [facility] for her pregnancy, was hospitalized over the weekend with suspicion of viral meningitis, was told to f/u with OBGYN & family doctor, pt wants to know if its necessary to be seen here since she is following OBGYN?

## 2010-10-14 NOTE — Telephone Encounter (Signed)
Please let her know I would be happy to see her if she has any symptoms or if Dr Aldona Bar would like her to see me Thanks Dyasia Firestine L

## 2010-10-14 NOTE — Telephone Encounter (Signed)
LMOVM informing patient of the above

## 2010-10-28 NOTE — Discharge Summary (Signed)
NAMEDORLEEN, KISSEL NO.:  [id number]   MEDICAL RECORD NO.:  [id number]          PATIENT TYPE:  INP   LOCATION:  5028                         FACILITY:  [facility]   PHYSICIAN:  Madelynn Done, MD  DATE OF BIRTH:  March 04, 1976   DATE OF ADMISSION:  03/31/2007  DATE OF DISCHARGE:  04/03/2007                               DISCHARGE SUMMARY      Madelynn Done, MD  Electronically Signed     FWO/MEDQ  D:  04/03/2007  T:  04/04/2007  Job:  [id number]

## 2010-10-28 NOTE — Discharge Summary (Signed)
Dana Knight, RADY NO.:  [id number]   MEDICAL RECORD NO.:  [id number]          PATIENT TYPE:  INP   LOCATION:  5028                         FACILITY:  [facility]   PHYSICIAN:  Madelynn Done, MD  DATE OF BIRTH:  1976-01-04   DATE OF ADMISSION:  03/31/2007  DATE OF DISCHARGE:  04/03/2007                               DISCHARGE SUMMARY   ADMITTING DIAGNOSIS:  Dog bite, right hand.   DISCHARGE DIAGNOSIS:  Right hand dog bite with cellulitis.   PROCEDURES AND DATES:  None.   DISCHARGE MEDICATIONS:  1. Augmentin 875 mg p.o. twice a day for 10 days.  2. Percocet 5/325 one to tablets p.o. q.4-6h. as needed for pain.   BRIEF HISTORY:  Dana Knight is a 31-year right-hand dominant female who  sustained a dog bite to the volar and dorsal aspect of her right hand.  The patient was seen and evaluated in the emergency department and noted  to have increasing pain and signs and symptoms consistent with a  cellulitis.  She was admitted for IV antibiotics and immobilization.   HOSPITAL COURSE:  The patient was admitted to the orthopedic floor.  She  was begun on IV antibiotic, Unasyn.  She was immobilized in a thumb  spica splint.  Throughout her hospital course, she remained afebrile,  and her vital signs remained stable and normal throughout.  Her pain was  controlled on oral pain medication.  She was seen and examined  throughout her hospital course, and her hand appeared to be responding  to the medical treatment.  She was seen on April 03, 2007, and the  hand looked better.  She was still having a little bit of tenderness  over the thenar region of the thumb, but no gross evidence of infection  or worsening infection.  She was felt ready to be discharged to home on  April 03, 2007.   RECOMMENDATIONS AND DISPOSITION:  She is to be followed up in the office  on April 05, 2007 at 8 o'clock in the morning.  She was given the  above discharge medication.  She is to  keep her splint on at all times.  Prior to her discharge, the patient's discharge instructions were  explained to her, her questions were answered, and the patient voiced  understanding of the discharge instructions.  I plan to see her back in  the office for routine close follow up.      Madelynn Done, MD  Electronically Signed     FWO/MEDQ  D:  04/03/2007  T:  04/04/2007  Job:  [id number]

## 2010-10-31 NOTE — Discharge Summary (Signed)
Dana Knight, WEILBACHER NO.:  [id number]   MEDICAL RECORD NO.:  [id number]          PATIENT TYPE:  INP   LOCATION:  9162                          FACILITY:  [facility]   PHYSICIAN:  Malachi Pro. Ambrose Mantle, M.D. DATE OF BIRTH:  Apr 10, 1976   DATE OF ADMISSION:  07/17/2004  DATE OF DISCHARGE:                                 DISCHARGE SUMMARY   HOSPITAL COURSE:  A 35 year old black female para 1-0-3-1 gravida 5; EDC  October 09, 2004 by ultrasound on March 21, 2004; admitted for magnesium  sulfate because of contractions and some cervical change unresponsive to  terbutaline and heart rate of 123-131 from terbutaline.  The patient's blood  group and type was AB positive, negative antibody, sickle cell negative, RPR  nonreactive, rubella immune, hepatitis B surface antigen negative, HIV  negative, GC and chlamydia negative.  The patient has had contractions for  several days.  She came here 1 day before admission and was evaluated for  contractions.  The cervix changed slightly.  Contractions persisted on  terbutaline and the patient's heart rate was 123 to 131.  She received  betamethasone at 2 p.m. on July 16, 2004.  She was admitted for magnesium  sulfate and received her second dose of betamethasone.  Past medical  history:  No known allergies.  Operations:  D&C in 2005.  Illnesses:  She  did have herpes with infrequent outbreaks.  Obstetric history:  In 1997 she  had an early abortion; October 2003, a 5-pound female vaginally at 40 weeks;  2004 and 2005 early abortions and spontaneous abortion with a D&C  respectively.  Family history:  Maternal grandmother with diabetes, maternal  aunt with thyroid problems.  Alcohol, tobacco, and drugs:  None.  Physical  exam:  Vital signs:  Temperature 98, blood pressure 126/69, pulse 123,  respirations 20.  Heart showed a normal sinus tachycardia, 2/6 systolic  ejection murmur.  Lungs were clear to auscultation.  Abdomen:  Fundal height  had been 26 cm on June 27, 2004.  Fetal heart tones were normal.  Cervix  was soft, closed to my exam, but 1 cm and 60% per Dr. Senaida Ores.  Impressions:  Intrauterine pregnancy at 28 weeks 1 day, possible preterm  labor.  The patient was placed on IV magnesium sulfate and placed on Unasyn  pending group B strep culture.  She received her second dose of  betamethasone 24 hours after the first one.  The uterine contractions  responded to the magnesium sulfate.  During the early morning of July 18, 2004 she had nausea and vomiting.  Magnesium level was 4.6.  Magnesium was  stopped.  The nausea and vomiting cleared with Phenergan.  She is having no  regular contractions now.  There are occasional decelerations of the  heartbeat but the nonstress test was reactive.  Laboratory data showed a  hemoglobin of 9; hematocrit 26.5; white count 15,300; platelet count  376,000.  Wet prep was negative.  Urinalysis normal except for greater than  1000 mg% of glucose.  The patient is going to undergo a biophysical profile  before discharge and return to the office in 3 days for nonstress test.      TFH/MEDQ  D:  07/18/2004  T:  07/18/2004  Job:  [id number]

## 2010-10-31 NOTE — Discharge Summary (Signed)
Dana Knight, Dana Knight               ACCOUNT NO.:  [id number]   MEDICAL RECORD NO.:  [id number]          PATIENT TYPE:  INP   LOCATION:  9117                          [facility]ILITY:  [facility]   PHYSICIAN:  Zenaida Niece, M.D.DATE OF BIRTH:  [birth year]   DATE OF ADMISSION:  09/29/2004  DATE OF DISCHARGE:  10/01/2004                                 DISCHARGE SUMMARY   ADMISSION DIAGNOSIS:  Intrauterine pregnancy at 38 weeks.   DISCHARGE DIAGNOSIS:  Intrauterine pregnancy at 38 weeks.   PROCEDURES:  On September 29, 2004 she had a spontaneous vaginal delivery.   HISTORY AND PHYSICAL:  This is a 35 year old black female gravida 5, para 1-  0-3-1 with an EGA of [redacted] weeks who presents in early labor. Prenatal care  complicated by a GI virus at 25 weeks, preterm contractions,  gastroesophageal reflux, bronchitis at 28 weeks which was treated with  Augmentin. She has recently been miserable with pelvic pressure and  contractions, and had an amniocentesis performed on April3 for possible  induction but at that point she had immature fetal lung studies. Prenatal  labs:  Blood type is AB positive with a negative antibody screen, RPR  nonreactive, rubella immune, hepatitis B surface antigen negative, HIV  negative, gonorrhea and chlamydia negative, 1-hour Glucola 87, group B strep  is negative.  Past OB history:  Two elective terminations and one spontaneous abortion,  and in 2003 she had a vaginal delivery at 40 weeks, 5 pounds, no  complications.  GYN history:  History of herpes simplex virus without current lesions or  symptoms.  Current medications:  Over-the-counter Zantac.  Physical exam:  She is afebrile with stable vital signs. Fetal heart tracing  reactive with irregular contractions. Abdomen:  Gravid, nontender, with an  estimated fetal weight of 6 pounds. Cervix was 3-4, 80, -1, vertex  presentation, and amniotomy revealed clear fluid.   HOSPITAL COURSE:  The patient was admitted in early  labor and had amniotomy  performed for augmentation. She progressed and received an epidural. She  progressed to complete and pushed well and on the afternoon of April17 had a  vaginal delivery of a viable female infant with Apgars of 9 and 9 that weighed  7 pounds 6 ounces. Placenta delivered spontaneously and was intact. Perineum  had three superficial abrasions which were hemostatic and not repaired.  Estimated blood loss was approximately 500 cc. Postpartum she did have some  mild uterine atony which responded to uterine massage and p.o. Methergine.  Predelivery hemoglobin 9.3, postdelivery 7.5. On postpartum #2 she was felt  to be stable enough for discharge home. The patient requested Depo-Provera  prior to discharge.   DISCHARGE INSTRUCTIONS:  Regular diet, pelvic rest, follow-up in 6 weeks.  Medications are Percocet #20 one to two p.o. q.4-6h. p.r.n., pain over-the-  counter ibuprofen as needed, and over-the-counter iron b.i.d. She is also  given our discharge pamphlet. There was also order written for her to  receive Depo-Provera prior to discharge.      TDM/MEDQ  D:  10/23/2004  T:  10/23/2004  Job:  [id number]

## 2010-10-31 NOTE — Op Note (Signed)
NAME:  Dana Knight, Dana Knight                         ACCOUNT NO.:  [id number]   MEDICAL RECORD NO.:  [id number]                   PATIENT TYPE:  OUT   LOCATION:  ULT                                  [facility]  [facility]   PHYSICIAN:  Michelle L. Vincente Poli, M.D.            DATE OF BIRTH:  05-May-1976   DATE OF PROCEDURE:  12/07/2003  DATE OF DISCHARGE:                                 OPERATIVE REPORT   PREOPERATIVE DIAGNOSES:  Missed abortion.   POSTOPERATIVE DIAGNOSES:  Missed abortion.   PROCEDURE:  Dilatation and evacuation.   SURGEON:  Michelle L. Vincente Poli, M.D.   ANESTHESIA:  MAC paracervical block.   ESTIMATED BLOOD LOSS:  Less than 50 mL.   COMPLICATIONS:  None.   DESCRIPTION OF PROCEDURE:  The patient is taken to the operating room, she  was given sedation and placed in the lithotomy position. The vagina and  vulva were prepped and draped in the usual sterile fashion, in and out  catheter was used to empty the bladder. The speculum was inserted into the  vagina, the cervix was grasped with a tenaculum and a paracervical block was  performed at 5 and 7 o'clock in the standard fashion.  The uterus was  founded to 7 cm and was noted to be anteverted.  The cervical internal os  was gently dilated using Pratt dilators. A #7 suction cannula was then  inserted into the uterus and suction curettage was performed and retrieval  of contents grossly consistent with products of conception.  A sharp curette  was inserted and the uterus thoroughly curetted of all tissue. Final suction  curettage was then performed. There was no vaginal bleeding noted at the end  of the procedure. All sponge, lap and instrument counts were correct x2.  All instruments were removed from the vagina and the patient went to the  recovery room in stable condition.                                               Michelle L. Vincente Poli, M.D.    Florestine Avers  D:  12/07/2003  T:  12/08/2003  Job:  [id number]

## 2010-10-31 NOTE — Discharge Summary (Signed)
   NAME:  Dana Knight, Dana Knight                         ACCOUNT NO.:  [id number]   MEDICAL RECORD NO.:  [id number]                   PATIENT TYPE:  INP   LOCATION:  9107                                 [facility]   PHYSICIAN:  Gerrit Friends. Aldona Bar, M.D.                DATE OF BIRTH:  Dec 07, 1975   DATE OF ADMISSION:  03/23/2002  DATE OF DISCHARGE:  03/26/2002                                 DISCHARGE SUMMARY   DISCHARGE DIAGNOSES:  1. Term pregnancy delivered, 5 pound 15 ounce female with Apgars 8 and 9.  2. Blood type AB positive.   PROCEDURES:  1. Normal spontaneous delivery.  2. Second degree tear and repair.   SUMMARY:  This 35 year old gravida 2 para 0 was admitted at term in early  labor.  She had a history of herpes but no obvious or occult lesions at the  time of admission so therefore she was considered a good candidate for a  vaginal delivery.  She had a normal progression of her labor with subsequent  normal spontaneous vaginal delivery of a viable female infant weighing 5  pounds 15 ounces over a second degree tear.  The tear was repaired without  difficulty and the patient's postpartum course was benign.  She was bottle  feeding.  Her discharge hemoglobin was 6.8 (predelivery 9.9) with a white  count of 8500 and a platelet count of 215,000.  On the morning of March 26, 2002 she was ambulating well, tolerating a regular diet well, vital  signs were stable, she was afebrile, having normal bowel and bladder  function, her baby was doing well, bottle feeding without difficulty, and  she was ready for discharge.  Accordingly, she was given all appropriate  instructions and understood all instructions well.   DISCHARGE MEDICATIONS:  1. Vitamins - one a day until gone.  2. Ferrous sulfate 300 mg once to twice daily.  3. Motrin 600 mg as needed for pain q.6h.  4. Tylox one to two q.4-6h. as needed for severe pain.   FOLLOW-UP:  She will return to the office for follow-up in  approximately  four weeks time.   CONDITION ON DISCHARGE:  Improved.                                               Gerrit Friends. Aldona Bar, M.D.    RMW/MEDQ  D:  03/26/2002  T:  03/27/2002  Job:  [id number]

## 2010-11-13 NOTE — Discharge Summary (Signed)
  NAMEALANTRA, POPOCA               ACCOUNT NO.:  [id number]  MEDICAL RECORD NO.:  [id number]           PATIENT TYPE:  I  LOCATION:  9308                          [facility]  PHYSICIAN:  Dane Kopke H. Tenny Craw, MD     DATE OF BIRTH:  1976/06/10  DATE OF ADMISSION:  10/09/2010 DATE OF DISCHARGE:  10/11/2010                              DISCHARGE SUMMARY   ADMITTING PHYSICIAN:  Carrington Clamp, MD  DISCHARGING PHYSICIAN:  Freddrick March. Tenny Craw, MD  HOSPITAL DIAGNOSES: 1. A 19-week intrauterine pregnancy. 2. Persistent headache. 3. Presumed viral illness.  HOSPITAL COURSE:  Ms. Foxworthy is a 35 year old G5, P2 who was admitted on October 09, 2010 for complaints of severe headache that persisted for 3 days.  At the time of admission, she also demonstrated arthralgias and myalgias, malaise and fatigue and was having symptoms of nuchal rigidity.  She was admitted to the hospital for control of her headache and for evaluation for possible meningitis due to her severe headache. White blood cell count on admission was 13.1, temperature on admission was 99.1.  During her admission, a lumbar puncture was attempted by Anesthesia twice, however, it was unsuccessful.  On hospital day #2, her white blood cell count declined to 8.3 and she remained afebrile for the remainder of the hospital course.  A neuro consult was obtained and given her symptoms had improved, her white count had declined and she was afebrile.  The concern for bacterial meningitis had passed and it was felt that repeating an attempt at a lumbar puncture would not provide any further information for treatment of the patient.  If the patient had viral meningitis, no additional treatment other than supportive care is recommended.  By hospital day #2, she had somewhat improvement in her headache, had slept well overnight, was not requiring IV narcotic pain medication, and had her headache symptoms controlled with Phenergan and Vicodin.   She did have a CT of the head which was normal and by hospital day #2, she was deemed ready for discharge home. She is being discharged home with prescriptions for Phenergan and Vicodin.  She should follow up in the office in 1 week.  She is instructed on signs or symptoms to be aware for need to return to the hospital.  DISCHARGE LABS:  Sodium 133, potassium 3.6, chloride 103, CO2 27, BUN 3, creatinine 0.49, glucose 91, AST 13, ALT 13, white blood cell count 10.2, hemoglobin 9.9, platelets 237,000.  HIV nonreactive.     Freddrick March. Tenny Craw, MD     KHR/MEDQ  D:  10/11/2010  T:  10/11/2010  Job:  [id number]  Electronically Signed by Waynard Reeds MD on 11/13/2010 09:04:46 AM

## 2011-01-09 ENCOUNTER — Inpatient Hospital Stay (HOSPITAL_COMMUNITY)
Admission: AD | Admit: 2011-01-09 | Discharge: 2011-01-10 | Disposition: A | Discharge status: Home / Self Care | Payer: Medicaid Other | Source: Ambulatory Visit | Attending: Obstetrics & Gynecology | Admitting: Obstetrics & Gynecology

## 2011-01-09 DIAGNOSIS — K5900 Constipation, unspecified: Secondary | ICD-10-CM | POA: Insufficient documentation

## 2011-01-09 DIAGNOSIS — O479 False labor, unspecified: Secondary | ICD-10-CM

## 2011-01-09 DIAGNOSIS — O9989 Other specified diseases and conditions complicating pregnancy, childbirth and the puerperium: Secondary | ICD-10-CM | POA: Insufficient documentation

## 2011-01-09 DIAGNOSIS — O4700 False labor before 37 completed weeks of gestation, unspecified trimester: Secondary | ICD-10-CM

## 2011-01-09 NOTE — Progress Notes (Signed)
Pt states, " I started having contractions off and on since yesterday, but they are worse today. I've had a headache all day; I took tylenol at 2000 but they didn't help."

## 2011-01-10 ENCOUNTER — Encounter (HOSPITAL_COMMUNITY): Payer: Self-pay | Admitting: *Deleted

## 2011-01-10 LAB — URINALYSIS, ROUTINE W REFLEX MICROSCOPIC
Leukocytes, UA: NEGATIVE
Nitrite: NEGATIVE
Specific Gravity, Urine: 1.02 (ref 1.005–1.030)
pH: 7 (ref 5.0–8.0)

## 2011-01-10 MED ORDER — PROMETHAZINE HCL 25 MG RE SUPP: 25.0000 mg | Freq: Once | RECTAL | Status: DC

## 2011-01-10 MED ORDER — TERBUTALINE SULFATE 1 MG/ML IJ SOLN
0.2500 mg | Freq: Once | INTRAMUSCULAR | Status: AC
  Administered 2011-01-10: 0.25 mg via SUBCUTANEOUS
  Filled 2011-01-10: qty 1

## 2011-01-10 MED ORDER — PROMETHAZINE HCL 25 MG/ML IJ SOLN: 25.0000 mg | Freq: Four times a day (QID) | INTRAMUSCULAR | Status: DC | PRN

## 2011-01-10 MED ORDER — PROMETHAZINE HCL 25 MG RE SUPP: 25.0000 mg | Freq: Once | RECTAL | Status: AC

## 2011-01-10 NOTE — ED Notes (Signed)
0225 Wynelle Bourgeois CNM in to discuss d/c plan with pt.

## 2011-01-10 NOTE — ED Provider Notes (Signed)
FACULTY PRACTICE ANTEPARTUM(COMPREHENSIVE) NOTE  Dana Knight is a 35 y.o. G3P2002 at [redacted]w[redacted]d by LMP who presents for c/o contractions and pressure related to constipation for one week. Denies leaking or bleeding..   Fetal presentation is cephalic.   Subjective:  Patient reports the fetal movement as active. Patient reports uterine contraction  activity as regular, every 2 minutes. Patient reports  vaginal bleeding as none. Patient describes fluid per vagina as None.  Vitals:  Blood pressure 120/63, pulse 73, temperature 98.5 F (36.9 C), temperature source Oral, resp. rate 20, height 5' 4.5" (1.638 m), weight 143 lb (64.864 kg). Physical Examination:  General appearance - alert, well appearing, and in no distress and oriented to person, place, and time Heart -  normal rate and regular rhythm Abdomen - soft, nontender, nondistended, no masses or organomegaly Gravid in contour, size = dates Pelvic - normal external genitalia, vulva, vagina, cervix, uterus and adnexa, CERVIX: normal appearing cervix without discharge or lesions, Cervix tight 1cm and 40%/soft Skin - normal coloration and turgor, no rashes, no suspicious skin lesions noted Fundal Height:  size equals dates Pelvic Exam:  normal external genitalia, vulva, vagina, cervix, uterus and adnexa  Extremities: extremities normal, atraumatic, no cyanosis or edema  Membranes:intact  Fetal Monitoring:  Baseline: 135 bpm, Variability: Good {> 6 bpm) and Accelerations: Reactive  Labs:  Recent Results (from the past 24 hour(s))  URINALYSIS, ROUTINE W REFLEX MICROSCOPIC   Collection Time   01/09/11 11:59 PM      Component Value Range   Color, Urine YELLOW  YELLOW    Appearance CLEAR  CLEAR    Specific Gravity, Urine 1.020  1.005 - 1.030    pH 7.0  5.0 - 8.0    Glucose, UA NEGATIVE  NEGATIVE (mg/dL)   Hgb urine dipstick NEGATIVE  NEGATIVE    Bilirubin Urine NEGATIVE  NEGATIVE    Ketones, ur 15 (*) NEGATIVE (mg/dL)   Protein, ur  NEGATIVE  NEGATIVE (mg/dL)   Urobilinogen, UA 1.0  0.0 - 1.0 (mg/dL)   Nitrite NEGATIVE  NEGATIVE    Leukocytes, UA NEGATIVE  NEGATIVE   FETAL FIBRONECTIN   Collection Time   01/10/11 12:53 AM      Component Value Range   Fetal Fibronectin NEGATIVE  NEGATIVE       Medications:  Scheduled    . promethazine  25 mg Rectal Once  . terbutaline  0.25 mg Subcutaneous Once   I have reviewed the patient's current medications.  ASSESSMENT: Patient Active Problem List  Diagnoses  . UNSPECIFIED VAGINITIS AND VULVOVAGINITIS  . VAGINAL DISCHARGE  . FATIGUE  . ABNORMAL PAP SMEAR, LGSIL  IUP at 33 weeks Preterm contractions   PLAN: Discussed with Dr Arlyce Dice WIll get FFn and give one dose of Terbutaline for tocolysis  [facility] 01/10/2011,2:07 AM

## 2011-01-10 NOTE — ED Provider Notes (Signed)
FFn is negative. UCs have slowed and pt states they feel much milder. FHR stable.  Nausea improved after she vomited. Phenergan held due to pt driving. Will give Rx to take home. Hesitate to use Zofran due to long course of constipation.  Dr Arlyce Dice called:  D/C home to followup in office. Recommend pt get on daily fiber supplement. Consult her doctor on laxative of choice. Encourage increased water and fiber intake.

## 2011-01-10 NOTE — Progress Notes (Signed)
Pt dozing. Nausea has subsided for now.

## 2011-01-10 NOTE — Progress Notes (Signed)
Pt was admitted to hosp. In April with ? menningitis which turned out to be negative. Was swabbed for MRSA at that time and was neg on 10/09/2010

## 2011-01-10 NOTE — ED Notes (Signed)
0230 Written and verbal d/c instructions given to pt by Arlys John RN and understanding voiced by pt.

## 2011-01-10 NOTE — Progress Notes (Signed)
0130 Dana Knight CNM aware of n/v. Will order med. Pt aware.

## 2011-01-10 NOTE — ED Notes (Signed)
Pitcher of water to pt 

## 2011-01-10 NOTE — ED Notes (Signed)
Pt vomited small-mod amt cl liq.

## 2011-01-10 NOTE — ED Notes (Signed)
CNM had ordered phenergan for nausea. Pt drove self. Pt states nausea is better.  CNM aware. Will hold phenergan for now. Pt agrees.

## 2011-01-11 NOTE — ED Provider Notes (Signed)
cosign

## 2011-01-28 LAB — STREP B DNA PROBE: GBS: NEGATIVE

## 2011-01-31 ENCOUNTER — Inpatient Hospital Stay (HOSPITAL_COMMUNITY)
Admission: AD | Admit: 2011-01-31 | Discharge: 2011-01-31 | Disposition: A | Discharge status: Home / Self Care | Payer: Medicaid Other | Source: Ambulatory Visit | Attending: Obstetrics and Gynecology | Admitting: Obstetrics and Gynecology

## 2011-01-31 ENCOUNTER — Encounter (HOSPITAL_COMMUNITY): Payer: Self-pay | Admitting: *Deleted

## 2011-01-31 DIAGNOSIS — O4700 False labor before 37 completed weeks of gestation, unspecified trimester: Secondary | ICD-10-CM

## 2011-01-31 HISTORY — DX: Other specified health status: Z78.9

## 2011-01-31 LAB — URINALYSIS, ROUTINE W REFLEX MICROSCOPIC
Glucose, UA: NEGATIVE mg/dL
Ketones, ur: 15 mg/dL — AB
Leukocytes, UA: NEGATIVE
Protein, ur: NEGATIVE mg/dL
Urobilinogen, UA: 1 mg/dL (ref 0.0–1.0)

## 2011-01-31 MED ORDER — ZOLPIDEM TARTRATE ER 12.5 MG PO TBCR: 12.5000 mg | EXTENDED_RELEASE_TABLET | Freq: Every evening | ORAL | Status: AC | PRN

## 2011-01-31 MED ORDER — NIFEDIPINE 10 MG PO CAPS
10.0000 mg | ORAL_CAPSULE | Freq: Once | ORAL | Status: AC
  Administered 2011-01-31: 10 mg via ORAL
  Filled 2011-01-31: qty 1

## 2011-01-31 NOTE — Progress Notes (Signed)
FLUIDS  OFFERED 

## 2011-01-31 NOTE — Progress Notes (Signed)
Patient reports onset of pain during the night, pain is getting worse every 5 to 6 minutes, back pain and buttock pain.

## 2011-01-31 NOTE — Progress Notes (Signed)
EFM D/C PT D/C HOME

## 2011-01-31 NOTE — ED Provider Notes (Signed)
History   Pt presents today c/o ctx since this am. She denies vag dc, bleeding, or any other problems at this time. She reports GFM.  Chief Complaint  Patient presents with  . Abdominal Pain   HPI  OB History    Grav Para Term Preterm Abortions TAB SAB Ect Mult Living   3 2 2  0 0 0 0 0 0 2      Past Medical History  Diagnosis Date  . No pertinent past medical history     Past Surgical History  Procedure Date  . No past surgeries     No family history on file.  History  Substance Use Topics  . Smoking status: Not on file  . Smokeless tobacco: Not on file  . Alcohol Use: No    Allergies: No Known Allergies  Prescriptions prior to admission  Medication Sig Dispense Refill  . prenatal vitamin w/FE, FA (PRENATAL 1 + 1) 27-1 MG TABS Take 1 tablet by mouth daily.        . ranitidine (ZANTAC) 75 MG tablet Take 75-150 mg by mouth daily.          Review of Systems  Constitutional: Negative for fever.  Cardiovascular: Negative for chest pain.  Gastrointestinal: Positive for abdominal pain. Negative for nausea, vomiting, diarrhea and constipation.  Genitourinary: Negative for dysuria, urgency, frequency and hematuria.  Neurological: Negative for dizziness.  Psychiatric/Behavioral: Negative for depression and suicidal ideas.   Physical Exam   Blood pressure 119/59, pulse 87, temperature 99.1 F (37.3 C), temperature source Oral, resp. rate 16, height 5\' 5"  (1.651 m), weight 149 lb 6.4 oz (67.767 kg).  Physical Exam  Constitutional: She is oriented to person, place, and time. She appears well-developed and well-nourished. No distress.  HENT:  Head: Normocephalic and atraumatic.  Eyes: EOM are normal. Pupils are equal, round, and reactive to light.  GI: Soft. She exhibits no distension. There is no tenderness. There is no rebound and no guarding.  Genitourinary: No bleeding around the vagina. No vaginal discharge found.       Cervix 1/70/-3. Fetus is vertex.    Neurological: She is alert and oriented to person, place, and time.  Skin: Skin is warm and dry. She is not diaphoretic.  Psychiatric: She has a normal mood and affect. Her behavior is normal. Judgment and thought content normal.    MAU Course  Procedures  Discussed pt with Dr. Henderson Cloud. Dr. Henderson Cloud advised to give procardia and recheck cervix.  Care assumed from [facility], PA-C at 2000 Cervix unchanged from previous exam > 1 hours ago. 1/70/vtx/-3/ballot Dawana Asper E. 2039 01/31/11   Assessment and Plan   False Labor  Discharge home with labor precautions. Comfort measures reviewed. Encouraged to increase h2O d/t slight dehydration FU in office as scheduled in Wednesday.    Care of pt is being transferred to [facility], CNM.  Clinton Gallant. Rice III, DrHSc, MPAS, PA-C  01/31/2011, 6:52 PM

## 2011-02-24 ENCOUNTER — Encounter (HOSPITAL_COMMUNITY): Payer: Self-pay | Admitting: *Deleted

## 2011-02-24 ENCOUNTER — Inpatient Hospital Stay (HOSPITAL_COMMUNITY)
Admission: AD | Admit: 2011-02-24 | Discharge: 2011-02-25 | Disposition: A | Discharge status: Home / Self Care | Payer: Medicaid Other | Source: Ambulatory Visit | Attending: Obstetrics & Gynecology | Admitting: Obstetrics & Gynecology

## 2011-02-24 DIAGNOSIS — O479 False labor, unspecified: Secondary | ICD-10-CM | POA: Insufficient documentation

## 2011-02-24 NOTE — Progress Notes (Cosign Needed)
Pt G3 P2 at 39.3wks, seen in the office today SVE 3cm.  Pt denies any problems with pregnancy.  GBS -.

## 2011-02-24 NOTE — Progress Notes (Signed)
Pt reports uc's since 1900. Denies LOF or VB

## 2011-02-25 MED ORDER — ZOLPIDEM TARTRATE 10 MG PO TABS
10.0000 mg | ORAL_TABLET | Freq: Once | ORAL | Status: AC
  Administered 2011-02-25: 10 mg via ORAL
  Filled 2011-02-25: qty 1

## 2011-02-26 ENCOUNTER — Telehealth (HOSPITAL_COMMUNITY): Payer: Self-pay | Admitting: *Deleted

## 2011-02-26 ENCOUNTER — Encounter (HOSPITAL_COMMUNITY): Payer: Self-pay | Admitting: *Deleted

## 2011-02-26 NOTE — Telephone Encounter (Signed)
Preadmission screen  

## 2011-03-01 ENCOUNTER — Inpatient Hospital Stay (HOSPITAL_COMMUNITY)
Admission: AD | Admit: 2011-03-01 | Discharge: 2011-03-01 | Disposition: A | Discharge status: Home / Self Care | Payer: Medicaid Other | Source: Ambulatory Visit | Attending: Obstetrics and Gynecology | Admitting: Obstetrics and Gynecology

## 2011-03-01 ENCOUNTER — Encounter (HOSPITAL_COMMUNITY): Payer: Self-pay | Admitting: Obstetrics and Gynecology

## 2011-03-01 DIAGNOSIS — O479 False labor, unspecified: Secondary | ICD-10-CM | POA: Insufficient documentation

## 2011-03-01 MED ORDER — PANTOPRAZOLE SODIUM 40 MG PO TBEC
40.0000 mg | DELAYED_RELEASE_TABLET | Freq: Every day | ORAL | Status: AC
  Administered 2011-03-01: 40 mg via ORAL
  Filled 2011-03-01: qty 1

## 2011-03-01 MED ORDER — ZOLPIDEM TARTRATE 10 MG PO TABS
10.0000 mg | ORAL_TABLET | Freq: Once | ORAL | Status: AC
  Administered 2011-03-01: 10 mg via ORAL
  Filled 2011-03-01: qty 1

## 2011-03-01 NOTE — Progress Notes (Signed)
"  UC's that were back to back that started at 2315 for one and half hours.  They aren't as strong now as when I was at home."

## 2011-03-02 ENCOUNTER — Encounter (HOSPITAL_COMMUNITY): Payer: Self-pay | Admitting: Anesthesiology

## 2011-03-02 ENCOUNTER — Encounter (HOSPITAL_COMMUNITY): Payer: Self-pay

## 2011-03-02 ENCOUNTER — Inpatient Hospital Stay (HOSPITAL_COMMUNITY)
Admission: RE | Admit: 2011-03-02 | Discharge: 2011-03-04 | DRG: 775 | Disposition: A | Discharge status: Home / Self Care | Payer: Medicaid Other | Source: Ambulatory Visit | Attending: Obstetrics & Gynecology | Admitting: Obstetrics & Gynecology

## 2011-03-02 ENCOUNTER — Inpatient Hospital Stay (HOSPITAL_COMMUNITY): Payer: Medicaid Other | Admitting: Anesthesiology

## 2011-03-02 DIAGNOSIS — O480 Post-term pregnancy: Principal | ICD-10-CM | POA: Diagnosis present

## 2011-03-02 LAB — CBC
MCH: 26.5 pg (ref 26.0–34.0)
MCHC: 31.1 g/dL (ref 30.0–36.0)
Platelets: 325 10*3/uL (ref 150–400)
RBC: 3.47 MIL/uL — ABNORMAL LOW (ref 3.87–5.11)

## 2011-03-02 LAB — RPR: RPR Ser Ql: NONREACTIVE

## 2011-03-02 MED ORDER — CITRIC ACID-SODIUM CITRATE 334-500 MG/5ML PO SOLN: 30.0000 mL | ORAL | Status: DC | PRN

## 2011-03-02 MED ORDER — ONDANSETRON HCL 4 MG PO TABS: 4.0000 mg | ORAL_TABLET | ORAL | Status: DC | PRN

## 2011-03-02 MED ORDER — LANOLIN HYDROUS EX OINT: TOPICAL_OINTMENT | CUTANEOUS | Status: DC | PRN

## 2011-03-02 MED ORDER — OXYTOCIN BOLUS FROM INFUSION
500.0000 mL | Freq: Once | INTRAVENOUS | Status: DC
  Filled 2011-03-02: qty 500

## 2011-03-02 MED ORDER — BENZOCAINE-MENTHOL 20-0.5 % EX AERO
INHALATION_SPRAY | CUTANEOUS | Status: AC
  Administered 2011-03-02: 1 via TOPICAL
  Filled 2011-03-02: qty 56

## 2011-03-02 MED ORDER — ACETAMINOPHEN 325 MG PO TABS: 650.0000 mg | ORAL_TABLET | ORAL | Status: DC | PRN

## 2011-03-02 MED ORDER — PHENYLEPHRINE 40 MCG/ML (10ML) SYRINGE FOR IV PUSH (FOR BLOOD PRESSURE SUPPORT)
80.0000 ug | PREFILLED_SYRINGE | INTRAVENOUS | Status: DC | PRN
  Filled 2011-03-02: qty 5

## 2011-03-02 MED ORDER — TETANUS-DIPHTH-ACELL PERTUSSIS 5-2.5-18.5 LF-MCG/0.5 IM SUSP
0.5000 mL | Freq: Once | INTRAMUSCULAR | Status: AC
  Administered 2011-03-04: 0.5 mL via INTRAMUSCULAR
  Filled 2011-03-02: qty 0.5

## 2011-03-02 MED ORDER — DIBUCAINE 1 % RE OINT
1.0000 "application " | TOPICAL_OINTMENT | RECTAL | Status: DC | PRN
  Administered 2011-03-02: 1 via RECTAL
  Filled 2011-03-02: qty 28

## 2011-03-02 MED ORDER — ZOLPIDEM TARTRATE 5 MG PO TABS: 5.0000 mg | ORAL_TABLET | Freq: Every evening | ORAL | Status: DC | PRN

## 2011-03-02 MED ORDER — ZOLPIDEM TARTRATE 10 MG PO TABS: 10.0000 mg | ORAL_TABLET | Freq: Every evening | ORAL | Status: DC | PRN

## 2011-03-02 MED ORDER — DIPHENHYDRAMINE HCL 50 MG/ML IJ SOLN: 12.5000 mg | INTRAMUSCULAR | Status: DC | PRN

## 2011-03-02 MED ORDER — TERBUTALINE SULFATE 1 MG/ML IJ SOLN: 0.2500 mg | Freq: Once | INTRAMUSCULAR | Status: AC | PRN

## 2011-03-02 MED ORDER — LACTATED RINGERS IV SOLN: 500.0000 mL | INTRAVENOUS | Status: DC | PRN

## 2011-03-02 MED ORDER — IBUPROFEN 600 MG PO TABS
600.0000 mg | ORAL_TABLET | Freq: Four times a day (QID) | ORAL | Status: DC
  Administered 2011-03-02 – 2011-03-04 (×7): 600 mg via ORAL
  Filled 2011-03-02 (×6): qty 1

## 2011-03-02 MED ORDER — WITCH HAZEL-GLYCERIN EX PADS
1.0000 "application " | MEDICATED_PAD | CUTANEOUS | Status: DC | PRN
  Administered 2011-03-02: 1 via TOPICAL

## 2011-03-02 MED ORDER — OXYTOCIN 20 UNITS IN LACTATED RINGERS INFUSION - SIMPLE
1.0000 m[IU]/min | INTRAVENOUS | Status: DC
  Administered 2011-03-02: 2 m[IU]/min via INTRAVENOUS
  Administered 2011-03-02: 6 m[IU]/min via INTRAVENOUS
  Filled 2011-03-02: qty 1000

## 2011-03-02 MED ORDER — NALBUPHINE SYRINGE 5 MG/0.5 ML: 5.0000 mg | INJECTION | INTRAMUSCULAR | Status: DC | PRN

## 2011-03-02 MED ORDER — FENTANYL 2.5 MCG/ML BUPIVACAINE 1/10 % EPIDURAL INFUSION (WH - ANES)
14.0000 mL/h | INTRAMUSCULAR | Status: DC
  Administered 2011-03-02 (×2): 14 mL/h via EPIDURAL
  Filled 2011-03-02: qty 60

## 2011-03-02 MED ORDER — IBUPROFEN 600 MG PO TABS: 600.0000 mg | ORAL_TABLET | Freq: Four times a day (QID) | ORAL | Status: DC | PRN

## 2011-03-02 MED ORDER — OXYCODONE-ACETAMINOPHEN 5-325 MG PO TABS
1.0000 | ORAL_TABLET | ORAL | Status: DC | PRN
Start: 2011-03-02 — End: 2011-03-04
  Administered 2011-03-03 – 2011-03-04 (×5): 1 via ORAL
  Filled 2011-03-02 (×2): qty 2
  Filled 2011-03-02 (×3): qty 1
  Filled 2011-03-02 (×4): qty 2

## 2011-03-02 MED ORDER — EPHEDRINE 5 MG/ML INJ
10.0000 mg | INTRAVENOUS | Status: DC | PRN
  Filled 2011-03-02: qty 4

## 2011-03-02 MED ORDER — DIPHENHYDRAMINE HCL 25 MG PO CAPS: 25.0000 mg | ORAL_CAPSULE | Freq: Four times a day (QID) | ORAL | Status: DC | PRN

## 2011-03-02 MED ORDER — ONDANSETRON HCL 4 MG/2ML IJ SOLN: 4.0000 mg | INTRAMUSCULAR | Status: DC | PRN

## 2011-03-02 MED ORDER — BENZOCAINE-MENTHOL 20-0.5 % EX AERO
1.0000 "application " | INHALATION_SPRAY | CUTANEOUS | Status: DC | PRN
  Administered 2011-03-02: 1 via TOPICAL

## 2011-03-02 MED ORDER — SODIUM CHLORIDE 0.9 % IV SOLN: INTRAVENOUS | Status: DC

## 2011-03-02 MED ORDER — INFLUENZA VIRUS VACC SPLIT PF IM SUSP
0.5000 mL | Freq: Once | INTRAMUSCULAR | Status: AC
  Administered 2011-03-04: 0.5 mL via INTRAMUSCULAR
  Filled 2011-03-02: qty 0.5

## 2011-03-02 MED ORDER — LIDOCAINE HCL (PF) 1 % IJ SOLN: 30.0000 mL | INTRAMUSCULAR | Status: DC | PRN

## 2011-03-02 MED ORDER — LACTATED RINGERS IV SOLN
500.0000 mL | Freq: Once | INTRAVENOUS | Status: AC
  Administered 2011-03-02: 1000 mL via INTRAVENOUS

## 2011-03-02 MED ORDER — FLEET ENEMA 7-19 GM/118ML RE ENEM: 1.0000 | ENEMA | RECTAL | Status: DC | PRN

## 2011-03-02 MED ORDER — OXYTOCIN 20 UNITS IN LACTATED RINGERS INFUSION - SIMPLE: 125.0000 mL/h | Freq: Once | INTRAVENOUS | Status: DC

## 2011-03-02 MED ORDER — SENNOSIDES-DOCUSATE SODIUM 8.6-50 MG PO TABS
2.0000 | ORAL_TABLET | Freq: Every day | ORAL | Status: DC
  Administered 2011-03-02 – 2011-03-03 (×2): 2 via ORAL

## 2011-03-02 MED ORDER — LACTATED RINGERS IV SOLN
INTRAVENOUS | Status: DC
Start: 2011-03-02 — End: 2011-03-02
  Administered 2011-03-02: 125 mL/h via INTRAVENOUS

## 2011-03-02 MED ORDER — LIDOCAINE HCL 1.5 % IJ SOLN
INTRAMUSCULAR | Status: DC | PRN
  Administered 2011-03-02 (×2): 5 mL via EPIDURAL
  Administered 2011-03-02: 2 mL via EPIDURAL

## 2011-03-02 MED ORDER — SIMETHICONE 80 MG PO CHEW: 80.0000 mg | CHEWABLE_TABLET | ORAL | Status: DC | PRN

## 2011-03-02 MED ORDER — ONDANSETRON HCL 4 MG/2ML IJ SOLN: 4.0000 mg | Freq: Four times a day (QID) | INTRAMUSCULAR | Status: DC | PRN

## 2011-03-02 MED ORDER — OXYCODONE-ACETAMINOPHEN 5-325 MG PO TABS: 2.0000 | ORAL_TABLET | ORAL | Status: DC | PRN

## 2011-03-02 MED ORDER — PHENYLEPHRINE 40 MCG/ML (10ML) SYRINGE FOR IV PUSH (FOR BLOOD PRESSURE SUPPORT)
80.0000 ug | PREFILLED_SYRINGE | INTRAVENOUS | Status: DC | PRN
  Filled 2011-03-02 (×2): qty 5

## 2011-03-02 MED ORDER — EPHEDRINE 5 MG/ML INJ: 10.0000 mg | INTRAVENOUS | Status: DC | PRN

## 2011-03-02 MED ORDER — PRENATAL PLUS 27-1 MG PO TABS
1.0000 | ORAL_TABLET | Freq: Every day | ORAL | Status: DC
  Administered 2011-03-03 – 2011-03-04 (×2): 1 via ORAL
  Filled 2011-03-02 (×2): qty 1

## 2011-03-02 NOTE — H&P (Signed)
35 y.o. 40.2  [id number] comes in for post dates iol.  Otherwise has good fetal movement and no bleeding.  Past Medical History  Diagnosis Date  . No pertinent past medical history   . History of chlamydia   . HSV (herpes simplex virus) anogenital infection   . Anxiety     Past Surgical History  Procedure Date  . No past surgeries   . Colposcopy 2010  . Dilation and curettage of uterus     OB History    Grav Para Term Preterm Abortions TAB SAB Ect Mult Living   5 2 2  0 2 0 0 0 0 2     # Outc Date GA Lbr Len/2nd Wgt Sex Del Anes PTL Lv   1 ABT 1996           2 TRM 2003 [redacted]w[redacted]d  5lb(2.268kg) F SVD EPI  Yes   3 ABT 2005           4 TRM 2006 [redacted]w[redacted]d  7lb(3.175kg) M SVD EPI  Yes   5 CUR               History   Social History  . Marital Status: Single    Spouse Name: N/A    Number of Children: N/A  . Years of Education: N/A   Occupational History  . Not on file.   Social History Main Topics  . Smoking status: Never Smoker   . Smokeless tobacco: Not on file  . Alcohol Use: No  . Drug Use: No  . Sexually Active: No   Other Topics Concern  . Not on file   Social History Narrative  . No narrative on file   Review of patient's allergies indicates no known allergies.   Prenatal Course:  HSV+, declined prophylaxis, h/o GC/CT 2011-neg this pregnancy, marginal previa @17  wks - resolved.  Filed Vitals:   03/02/11 0753  Temp: 97.7 F (36.5 C)  Resp: 20     Lungs/Cor:  NAD Abdomen:  soft, gravid Ex:  no cords, erythema SVE:  3-4/70/-1 FHTs:  135mod +A no decels, good STV, NST R Toco:  Irregular ctx   A/P   Admit to L&D for iol for PDD  GBS Neg   sterile spec -no lesions  Start pitocin protocol  Epidural when desired  Philip Aspen    Brailen Macneal A

## 2011-03-02 NOTE — Anesthesia Preprocedure Evaluation (Signed)
Anesthesia Evaluation  Name, MR# and DOB Patient awake  General Assessment Comment  Reviewed: Allergy & Precautions, H&P , NPO status , Patient's Chart, lab work & pertinent test results, reviewed documented beta blocker date and time   History of Anesthesia Complications Negative for: history of anesthetic complications  Airway Mallampati: II TM Distance: >3 FB Neck ROM: full    Dental  (+) Teeth Intact   Pulmonary  clear to auscultation  breath sounds clear to auscultation none    Cardiovascular regular Normal    Neuro/Psych Negative Neurological ROS  Negative Psych ROS  GI/Hepatic/Renal negative GI ROS  negative Liver ROS  negative Renal ROS        Endo/Other  Negative Endocrine ROS (+)      Abdominal   Musculoskeletal   Hematology negative hematology ROS (+)   Peds  Reproductive/Obstetrics (+) Pregnancy    Anesthesia Other Findings             Anesthesia Physical Anesthesia Plan  ASA: II  Anesthesia Plan: Epidural   Post-op Pain Management:    Induction:   Airway Management Planned:   Additional Equipment:   Intra-op Plan:   Post-operative Plan:   Informed Consent: I have reviewed the patients History and Physical, chart, labs and discussed the procedure including the risks, benefits and alternatives for the proposed anesthesia with the patient or authorized representative who has indicated his/her understanding and acceptance.     Plan Discussed with:   Anesthesia Plan Comments:         Anesthesia Quick Evaluation  

## 2011-03-02 NOTE — Anesthesia Procedure Notes (Addendum)
Epidural Patient location during procedure: OB Start time: 03/02/2011 1:16 PM Reason for block: procedure for pain  Staffing Performed by: anesthesiologist   Preanesthetic Checklist Completed: patient identified, site marked, surgical consent, pre-op evaluation, timeout performed, IV checked, risks and benefits discussed and monitors and equipment checked  Epidural Patient position: sitting Prep: site prepped and draped and DuraPrep Patient monitoring: continuous pulse ox and blood pressure Approach: midline Injection technique: LOR air  Needle:  Needle type: Tuohy  Needle gauge: 17 G Needle length: 9 cm Needle insertion depth: 4 cm Catheter type: closed end flexible Catheter size: 19 Gauge Catheter at skin depth: 9 cm Test dose: negative  Assessment Events: blood not aspirated, injection not painful, no injection resistance, negative IV test and no paresthesia

## 2011-03-02 NOTE — Progress Notes (Signed)
Patient was complete and pushed for 40 minutes with epidural.   NSVD  Female infant, Apgars 7,9, weight 8#14, with one nuchal cord.   The patient had a minimal laceration at posterior introitus not requiring suture. Fundus was firm. EBL was expected. Placenta was delivered intact. Vagina was clear.  Baby was vigorous to bedside.  Dana Knight

## 2011-03-03 LAB — CBC
MCH: 26.6 pg (ref 26.0–34.0)
MCHC: 31.3 g/dL (ref 30.0–36.0)
Platelets: 252 10*3/uL (ref 150–400)
RDW: 14.5 % (ref 11.5–15.5)

## 2011-03-03 NOTE — Anesthesia Postprocedure Evaluation (Signed)
  Anesthesia Post-op Note  Patient: Dana Knight  Procedure(s) Performed: * No procedures listed *  Patient Location: 106  Anesthesia Type: Epidural  Level of Consciousness: awake, alert  and oriented  Airway and Oxygen Therapy: Patient Spontanous Breathing  Post-op Pain: mild  Post-op Assessment: Post-op Vital signs reviewed, Patient's Cardiovascular Status Stable, No headache, No backache, No residual numbness and No residual motor weakness  Post-op Vital Signs: Reviewed and stable  Complications: No apparent anesthesia complications

## 2011-03-03 NOTE — Progress Notes (Signed)
UR Chart review completed.  

## 2011-03-03 NOTE — Progress Notes (Signed)
Post Partum Day 1 Subjective: no complaints, up ad lib, voiding and tolerating PO  Objective: Blood pressure 119/69, pulse 86, temperature 98 F (36.7 C), temperature source Oral, resp. rate 18, height 5\' 6"  (1.676 m), weight 72.122 kg (159 lb), unknown if currently breastfeeding.  Physical Exam:  General: alert, cooperative, appears stated age and no distress Lochia: appropriate Uterine Fundus: firm DVT Evaluation: No evidence of DVT seen on physical exam.   Basename 03/03/11 0555 03/02/11 0807  HGB 7.2* 9.2*  HCT 23.0* 29.6*    Assessment/Plan: Routine postpartum care To have neonatal circ performed in office   LOS: 1 day   Dana Knight H. 03/03/2011, 10:42 AM

## 2011-03-04 NOTE — Discharge Summary (Signed)
Discharge diagnoses-#1  40 week plus intrauterine pregnancy delivered 8 lbs. 14 oz. Female infant Apgars 7 and 9  #2-blood type AB positive  Procedures-induction of labor and normal spontaneous delivery  This G5 now P3 was brought in for induction at 40 weeks and 2 days gestation with a favorable cervix. She was given Pitocin recall and ultimately had a normal spontaneous delivery of an 8 lbs. 14 oz. Female infant with Apgars of 7 and 9 over a minimal tear which required no repair. There was one nuchal cord noted at the time of delivery. Her postpartum hemoglobin was 7.2 with a white count of 11,700 and a platelet count of 252,000. She was attempting to breast-feed at the time of discharge. On the morning of 9/19 she was ready for discharge. She was given all appropriate instructions per discharge brochure and understood all instructions well. Medications at the time of discharge include vitamins-1 a day for as long as she is breast-feeding and the equivalent of Feosol capsules-1 daily. She will use over-the-counter Advil as needed for cramping or discomfort-2 every 4 hours or 3 every 6 hours. She will have the baby's circumcision done in the office and will arrange that. She will be seen for a postpartum visit in approximately 4 weeks' time.  Condition on discharge improved.

## 2011-03-12 LAB — POCT PREGNANCY, URINE: Preg Test, Ur: NEGATIVE

## 2011-03-12 LAB — POCT RAPID STREP A: Streptococcus, Group A Screen (Direct): POSITIVE — AB

## 2011-03-18 LAB — CULTURE, ROUTINE-ABSCESS

## 2011-03-25 LAB — DIFFERENTIAL
Eosinophils Relative: 0
Lymphocytes Relative: 15
Lymphs Abs: 1.4
Monocytes Absolute: 0.7

## 2011-03-25 LAB — CBC
HCT: 46.1 — ABNORMAL HIGH
Hemoglobin: 15.6 — ABNORMAL HIGH
WBC: 9.4

## 2011-03-25 LAB — CULTURE, BLOOD (ROUTINE X 2)

## 2011-03-25 LAB — POCT PREGNANCY, URINE: Operator id: 285841

## 2011-03-25 LAB — C-REACTIVE PROTEIN: CRP: 0.6 — ABNORMAL HIGH (ref <0.6)

## 2011-12-10 ENCOUNTER — Ambulatory Visit: Payer: 59 | Admitting: Family Medicine

## 2011-12-10 ENCOUNTER — Ambulatory Visit: Payer: Medicaid Other

## 2011-12-11 ENCOUNTER — Encounter: Payer: Self-pay | Admitting: Family Medicine

## 2011-12-11 ENCOUNTER — Other Ambulatory Visit (HOSPITAL_COMMUNITY)
Admission: RE | Admit: 2011-12-11 | Discharge: 2011-12-11 | Disposition: A | Discharge status: Home / Self Care | Payer: 59 | Source: Ambulatory Visit | Attending: Family Medicine | Admitting: Family Medicine

## 2011-12-11 ENCOUNTER — Ambulatory Visit (INDEPENDENT_AMBULATORY_CARE_PROVIDER_SITE_OTHER): Payer: 59 | Admitting: Family Medicine

## 2011-12-11 VITALS — BP 114/76 | HR 80 | Ht 65.0 in | Wt 108.0 lb

## 2011-12-11 DIAGNOSIS — N730 Acute parametritis and pelvic cellulitis: Secondary | ICD-10-CM

## 2011-12-11 DIAGNOSIS — N898 Other specified noninflammatory disorders of vagina: Secondary | ICD-10-CM

## 2011-12-11 DIAGNOSIS — Z113 Encounter for screening for infections with a predominantly sexual mode of transmission: Secondary | ICD-10-CM | POA: Insufficient documentation

## 2011-12-11 LAB — POCT WET PREP (WET MOUNT)

## 2011-12-11 LAB — HIV ANTIBODY (ROUTINE TESTING W REFLEX): HIV: NONREACTIVE

## 2011-12-11 MED ORDER — METRONIDAZOLE 500 MG PO TABS: 500.0000 mg | ORAL_TABLET | Freq: Two times a day (BID) | ORAL | Status: AC

## 2011-12-11 MED ORDER — CEFTRIAXONE SODIUM 250 MG IJ SOLR
250.0000 mg | Freq: Once | INTRAMUSCULAR | Status: AC
  Administered 2011-12-11: 250 mg via INTRAMUSCULAR

## 2011-12-11 MED ORDER — DOXYCYCLINE HYCLATE 100 MG PO TABS: 100.0000 mg | ORAL_TABLET | Freq: Two times a day (BID) | ORAL | Status: AC

## 2011-12-11 NOTE — Patient Instructions (Signed)
Pelvic Inflammatory Disease  Pelvic Inflammatory Disease (PID) is an infection in some or all of your female organs. This includes the womb (uterus), ovaries, fallopian tubes and tissues in the pelvis. PID is a common cause of sudden onset (acute) lower abdominal (pelvic) pain. PID can be treated, but it is a serious infection. It may take weeks before you are completely well. In some cases, hospitalization is needed for surgery or to administer medications to kill germs (antibiotics) through your veins (intravenously).  CAUSES    It may be caused by germs that are spread during sexual contact.   PID can also occur following:   The birth of a baby.   A miscarriage.   An abortion.   Major surgery of the pelvis.   Use of an IUD.   Sexual assault.  SYMPTOMS    Abdominal or pelvic pain.   Fever.   Chills.   Abnormal vaginal discharge.  DIAGNOSIS   Your caregiver will choose some of these methods to make a diagnosis:   A physical exam and history.   Blood tests.   Cultures of the vagina and cervix.   X-rays or ultrasound.   A procedure to look inside the pelvis (laparoscopy).  TREATMENT    Use of antibiotics by mouth or intravenously.   Treatment of sexual partners when the infection is an sexually transmitted disease (STD).   Hospitalization and surgery may be needed.  RISKS AND COMPLICATIONS    PID can cause women to become unable to have children (sterile) if left untreated or if partially treated. That is why it is important to finish all medications given to you.   Sterility or future tubal (ectopic) pregnancies can occur in fully treated individuals. This is why it is so important to follow your prescribed treatment.   It can cause longstanding (chronic) pelvic pain after frequent infections.   Painful intercourse.   Pelvic abscesses.   In rare cases, surgery or a hysterectomy may be needed.   If this is a sexually transmitted infection (STI), you are also at risk for any other STD  including AIDSor human papillomavirus (HPV).  HOME CARE INSTRUCTIONS    Finish all medication as prescribed. Incomplete treatment will put you at risk for sterility and tubal pregnancy.   Only take over-the-counter or prescription medicines for pain, discomfort, or fever as directed by your caregiver.   Do not have sex until treatment is completed or as directed by your caregiver. If PID is confirmed, your recent sexual contacts will need treatment.   Keep your follow-up appointments.  SEEK MEDICAL CARE IF:    You have increased or abnormal vaginal discharge.   You need prescription medication for your pain.   Your partner has an STD.   You are vomiting.   You cannot take your medications.  SEEK IMMEDIATE MEDICAL CARE IF:    You have a fever.   You develop increased abdominal or pelvic pain.   You develop chills.   You have pain when you urinate.   You are not better after 72 hours following treatment.  Document Released: 06/01/2005 Document Revised: 05/21/2011 Document Reviewed: 02/12/2007  [employer] Patient Information 2012 [employer].

## 2011-12-11 NOTE — Addendum Note (Signed)
Addended by: Burna Mortimer E on: 12/11/2011 12:12 PM   Modules accepted: Orders

## 2011-12-11 NOTE — Progress Notes (Signed)
  Subjective:    Patient ID: Dana Knight, female    DOB: 19-Sep-1975, 36 y.o.   MRN: [id number]  HPI VAGINAL DISCHARGE Onset: 1 week  Description: vaginal discharge, mild nausea Modifying factors: IUD, hx/o STDs, hx/o multiple sexual partners in the past.   Symptoms Odor: es Itching: yes Vaginal burning: no Dysuria: no Bleeding: spotting Pelvic pain:mild  Back pain: no Fever: no Genital sores: no Rash: no Dyspareunia: no GI Symptoms: mild nausea   Red Flags:  Missed period: no Recent antibiotics: no Possible STD exposure: recent unprotected sexual activity with FOB IUD: yes Diabetes: no   Review of Systems See HPI, otherwise ROS negative     Objective:   Physical Exam Gen: up in chair, NAD HEENT: NCAT, EOMI, TMs clear bilaterally CV: RRR, no murmurs auscultated PULM: CTAB, no wheezes, rales, rhoncii ABD: S/NT/+ bowel sounds  GU: normal external genitalia, minimal vaginal discharge, IUD strings visualized, + CMT EXT: 2+ peripheral pulses         Assessment & Plan:

## 2011-12-11 NOTE — Assessment & Plan Note (Signed)
Exam consistent with PID.  Will treat with rocephin, doxycycline, and flagyl.  Wet prep, GC/Chl, HIV, RPR Handout given.  Discussed general risk factors for PID.  Follow up with PCP in 1-2 weeks.

## 2011-12-12 LAB — RPR

## 2011-12-14 ENCOUNTER — Encounter: Payer: Self-pay | Admitting: Family Medicine

## 2012-01-22 ENCOUNTER — Encounter: Payer: Self-pay | Admitting: Family Medicine

## 2012-01-22 ENCOUNTER — Other Ambulatory Visit (HOSPITAL_COMMUNITY)
Admission: RE | Admit: 2012-01-22 | Discharge: 2012-01-22 | Disposition: A | Discharge status: Home / Self Care | Payer: 59 | Source: Ambulatory Visit | Attending: Family Medicine | Admitting: Family Medicine

## 2012-01-22 ENCOUNTER — Ambulatory Visit (INDEPENDENT_AMBULATORY_CARE_PROVIDER_SITE_OTHER): Payer: 59 | Admitting: Family Medicine

## 2012-01-22 VITALS — BP 102/70 | HR 72 | Ht 65.0 in | Wt 107.8 lb

## 2012-01-22 DIAGNOSIS — N898 Other specified noninflammatory disorders of vagina: Secondary | ICD-10-CM

## 2012-01-22 DIAGNOSIS — N760 Acute vaginitis: Secondary | ICD-10-CM

## 2012-01-22 DIAGNOSIS — Z113 Encounter for screening for infections with a predominantly sexual mode of transmission: Secondary | ICD-10-CM | POA: Insufficient documentation

## 2012-01-22 DIAGNOSIS — R634 Abnormal weight loss: Secondary | ICD-10-CM

## 2012-01-22 LAB — CBC WITH DIFFERENTIAL/PLATELET
Basophils Relative: 1 % (ref 0–1)
HCT: 44.1 % (ref 36.0–46.0)
Hemoglobin: 14.4 g/dL (ref 12.0–15.0)
MCH: 30.2 pg (ref 26.0–34.0)
MCHC: 32.7 g/dL (ref 30.0–36.0)
Monocytes Absolute: 0.4 10*3/uL (ref 0.1–1.0)
Monocytes Relative: 11 % (ref 3–12)
Neutro Abs: 1.6 10*3/uL — ABNORMAL LOW (ref 1.7–7.7)

## 2012-01-22 LAB — POCT WET PREP (WET MOUNT)

## 2012-01-22 NOTE — Telephone Encounter (Signed)
This encounter was created in error - please disregard.

## 2012-01-22 NOTE — Telephone Encounter (Signed)
Error

## 2012-01-25 DIAGNOSIS — R634 Abnormal weight loss: Secondary | ICD-10-CM | POA: Insufficient documentation

## 2012-01-25 NOTE — Assessment & Plan Note (Signed)
Patient claims that the weight loss has occurred over the past year since the delivery of her last child. She also notes that she has been working two jobs and has been very stressed with a decreased appetite. This history makes stress the most likely cause. However, I will get a screening CBC today to look for hematologic process. Follow-up in 2 weeks to discuss weight loss with PCP.

## 2012-01-25 NOTE — Progress Notes (Signed)
  Subjective:    Patient ID: Dana Knight, female    DOB: 10-Dec-1975, 36 y.o.   MRN: [id number]  HPI  36 year old F who presents with a chief complaint of vaginal discharge of 2 weeks duration. This is in the setting of treatment for PID on 6/28 with rocephin, doxycycline, and metronidazole. Her GC/Chlamydia culture was negative, so it was not likely true PID. Nonetheless, the patient completed this treatment. She believes that approximately 2 weeks after completing this course of medications, she noticed this vaginal discharge. It is yellow.  It is not associated with pain, odor or pruritis. She denies fever, abdominal pain, nausea, vomiting, and inguinal lymphadenopathy. The patient believes that it is yeast infection related to her recent antibiotic treatment. She notes that she currently has a boyfriend who is her only sexual partner. She is concerned that he may be cheating on her, but also notes that she is extremely distressed these days and states that her concern about her boyfriend and vaginal discharge may be "in my head."   Review of Systems  Constitutional: Positive for unexpected weight change. Negative for fever and chills.       20 pound unintentional weight loss in last year  Eyes: Negative.   Respiratory: Negative.   Genitourinary: Negative for dysuria, flank pain, enuresis, genital sores and dyspareunia.  Psychiatric/Behavioral:       Anxiety       Objective:   Physical Exam  Vitals reviewed. Constitutional: She appears well-developed. She appears distressed.       Thin body habitus  Abdominal: Soft. Bowel sounds are normal. She exhibits no distension. There is no tenderness.  Genitourinary: Uterus normal. Pelvic exam was performed with patient prone. There is no rash, tenderness or lesion on the right labia. There is no rash, tenderness or lesion on the left labia. Cervix exhibits no motion tenderness and no discharge. Right adnexum displays tenderness. Right adnexum  displays no mass. Left adnexum displays tenderness. Left adnexum displays no mass. There is tenderness around the vagina. No erythema around the vagina. No foreign body around the vagina. No vaginal discharge found.       Generalized tenderness and discomfort with all portions of the exam despite lack of evidence of inflammation of tissues; minimal dark blood from cervical os   Skin: She is not diaphoretic.   Microscopic wet-mount exam shows clue cells, yeast, DNA probe for chlamydia and GC obtained.     Assessment & Plan:  36 year old F with vaginal discharge that likely represents a yeast infection resulting from recent antibiotic use.

## 2012-01-25 NOTE — Assessment & Plan Note (Signed)
Treated with Diflucan 150 mg x 2, because the patient was insistent that she required 2 pills. I chose not to treat the clue cells as true bacterial vaginosis, but rather see if her symptoms resolved after treatment for vaginal candidiasis.

## 2012-02-25 ENCOUNTER — Emergency Department (HOSPITAL_COMMUNITY): Payer: 59

## 2012-02-25 ENCOUNTER — Emergency Department (HOSPITAL_COMMUNITY)
Admission: EM | Admit: 2012-02-25 | Discharge: 2012-02-25 | Disposition: A | Discharge status: Home / Self Care | Payer: 59 | Attending: Emergency Medicine | Admitting: Emergency Medicine

## 2012-02-25 ENCOUNTER — Encounter (HOSPITAL_COMMUNITY): Payer: Self-pay | Admitting: Emergency Medicine

## 2012-02-25 DIAGNOSIS — M549 Dorsalgia, unspecified: Secondary | ICD-10-CM | POA: Insufficient documentation

## 2012-02-25 DIAGNOSIS — F411 Generalized anxiety disorder: Secondary | ICD-10-CM | POA: Insufficient documentation

## 2012-02-25 DIAGNOSIS — R079 Chest pain, unspecified: Secondary | ICD-10-CM | POA: Insufficient documentation

## 2012-02-25 DIAGNOSIS — R51 Headache: Secondary | ICD-10-CM | POA: Insufficient documentation

## 2012-02-25 LAB — CBC WITH DIFFERENTIAL/PLATELET
Eosinophils Absolute: 0 10*3/uL (ref 0.0–0.7)
Eosinophils Relative: 1 % (ref 0–5)
HCT: 42 % (ref 36.0–46.0)
Hemoglobin: 14.2 g/dL (ref 12.0–15.0)
Lymphs Abs: 1.9 10*3/uL (ref 0.7–4.0)
MCH: 31.1 pg (ref 26.0–34.0)
MCV: 91.9 fL (ref 78.0–100.0)
Monocytes Absolute: 0.4 10*3/uL (ref 0.1–1.0)
Monocytes Relative: 10 % (ref 3–12)
Platelets: 313 10*3/uL (ref 150–400)
RBC: 4.57 MIL/uL (ref 3.87–5.11)

## 2012-02-25 LAB — BASIC METABOLIC PANEL
BUN: 10 mg/dL (ref 6–23)
Calcium: 9.8 mg/dL (ref 8.4–10.5)
Creatinine, Ser: 0.79 mg/dL (ref 0.50–1.10)
GFR calc non Af Amer: >90 mL/min (ref >90)
Glucose, Bld: 86 mg/dL (ref 70–99)

## 2012-02-25 MED ORDER — KETOROLAC TROMETHAMINE 30 MG/ML IJ SOLN
60.0000 mg | Freq: Once | INTRAMUSCULAR | Status: AC
  Administered 2012-02-25: 60 mg via INTRAMUSCULAR
  Filled 2012-02-25: qty 1

## 2012-02-25 MED ORDER — METOCLOPRAMIDE HCL 10 MG PO TABS
10.0000 mg | ORAL_TABLET | Freq: Once | ORAL | Status: AC
  Administered 2012-02-25: 10 mg via ORAL
  Filled 2012-02-25 (×2): qty 1

## 2012-02-25 MED ORDER — DIPHENHYDRAMINE HCL 25 MG PO CAPS
25.0000 mg | ORAL_CAPSULE | Freq: Once | ORAL | Status: AC
  Administered 2012-02-25: 25 mg via ORAL
  Filled 2012-02-25: qty 1

## 2012-02-25 NOTE — ED Notes (Signed)
Back pain and h/a x2 days when she bends over it hurts worse

## 2012-02-25 NOTE — ED Notes (Signed)
Having chest and back hurts  Sob when she walks

## 2012-02-25 NOTE — ED Notes (Signed)
Patient back from xray. Sitting in waiting room

## 2012-02-25 NOTE — ED Notes (Signed)
Pain currently 8/10 achy throbbing.

## 2012-02-25 NOTE — ED Provider Notes (Signed)
History     CSN: [id number]  Arrival date & time 02/25/12  1253   First MD Initiated Contact with Patient 02/25/12 1659      Chief Complaint  Patient presents with  . Back Pain    (Consider location/radiation/quality/duration/timing/severity/associated sxs/prior treatment) Patient is a 36 y.o. female presenting with back pain and headaches. The history is provided by the patient. No language interpreter was used.  Back Pain  This is a recurrent problem. The current episode started yesterday. The problem has been gradually worsening. The pain is associated with no known injury. The quality of the pain is described as aching. The pain does not radiate. The pain is at a severity of 6/10. The pain is mild. Associated symptoms include headaches. Pertinent negatives include no fever, no numbness, no abdominal pain, no bowel incontinence, no perianal numbness, no bladder incontinence, no dysuria, no pelvic pain, no leg pain, no paresthesias, no paresis, no tingling and no weakness.  Headache  This is a recurrent problem. The current episode started yesterday. The problem occurs constantly. The problem has been gradually worsening. The headache is associated with emotional stress. The pain is located in the bilateral and frontal region. The quality of the pain is described as throbbing. The pain is at a severity of 6/10. The pain is moderate. The pain radiates to the upper back. Pertinent negatives include no fever, no chest pressure, no near-syncope, no palpitations, no shortness of breath, no nausea and no vomiting. She has tried NSAIDs for the symptoms. The treatment provided no relief.  36 year old female coming in with complaint of headache and upper back pain intermittent x2 days. States that the headache has been constant today and she's been under a lot of stress with her children. States that she took ibuprofen at work last night with no relief.  States she was evaluated recently for the same  and a spinal tap was done which was negative.  Her PCP is family practice. Past medical history of Chlamydia, HSV, anxiety. She does not smoke. States that she is presently on a 3500-calorie diet to gain weight. Needs work note.  Past Medical History  Diagnosis Date  . No pertinent past medical history   . History of chlamydia   . HSV (herpes simplex virus) anogenital infection   . Anxiety     Past Surgical History  Procedure Date  . No past surgeries   . Colposcopy 2010  . Dilation and curettage of uterus     Family History  Problem Relation Age of Onset  . Stroke Mother   . Diabetes Father   . Thyroid disease Maternal Aunt   . Diabetes Maternal Grandmother     History  Substance Use Topics  . Smoking status: Never Smoker   . Smokeless tobacco: Not on file  . Alcohol Use: No    OB History    Grav Para Term Preterm Abortions TAB SAB Ect Mult Living   5 3 3  0 2 0 0 0 0 3      Review of Systems  Constitutional: Negative for fever.  Respiratory: Negative for shortness of breath.   Cardiovascular: Negative for palpitations and near-syncope.  Gastrointestinal: Negative for nausea, vomiting, abdominal pain and bowel incontinence.  Genitourinary: Negative for bladder incontinence, dysuria and pelvic pain.  Musculoskeletal: Positive for back pain.  Neurological: Positive for headaches. Negative for tingling, weakness, numbness and paresthesias.    Allergies  Review of patient's allergies indicates no known allergies.  Home Medications  No current outpatient prescriptions on file.  BP 121/70  Pulse 61  Temp 97.5 F (36.4 C) (Oral)  Resp 18  SpO2 100%  Physical Exam  Nursing note and vitals reviewed. Constitutional: She is oriented to person, place, and time. She appears well-developed and well-nourished.  HENT:  Head: Normocephalic and atraumatic.  Eyes: Conjunctivae normal and EOM are normal. Pupils are equal, round, and reactive to light.  Neck: Normal  range of motion. Neck supple.  Cardiovascular: Normal rate.   Pulmonary/Chest: Effort normal.  Abdominal: Soft.  Musculoskeletal: Normal range of motion. She exhibits no edema and no tenderness.  Neurological: She is alert and oriented to person, place, and time. She has normal reflexes.  Skin: Skin is warm and dry.  Psychiatric: She has a normal mood and affect.    ED Course  Procedures (including critical care time)  Labs Reviewed  CBC WITH DIFFERENTIAL - Abnormal; Notable for the following:    Neutrophils Relative 41 (*)     Neutro Abs 1.6 (*)     Lymphocytes Relative 48 (*)     All other components within normal limits  BASIC METABOLIC PANEL  POCT I-STAT TROPONIN I   Dg Chest 2 View  02/25/2012  *RADIOLOGY REPORT*  Clinical Data: Chest pain for a few days.  CHEST - 2 VIEW  Comparison: 07/10/2009  Findings:  The heart size and mediastinal contours are within normal limits.  Both lungs are clear.  The visualized skeletal structures are unremarkable. No change from priors.  IMPRESSION: No active cardiopulmonary disease.   Original Report Authenticated By: Elsie Stain, M.D.      No diagnosis found.    MDM  36yo with c/o frontal h/a x 2 days and upper back pain.  Needs work note also.  Good relief with migraine cocktail in the ER.  Labs unremarkable.  EKG and chest x-ray unremarkable.  No fever, SOB red flags or cauda equina symptoms.  -trop.     Date: 02/26/2012  Rate: 75  Rhythm: normal sinus rhythm  QRS Axis: normal  Intervals: normal  ST/T Wave abnormalities: normal  Conduction Disutrbances:none  Narrative Interpretation:   Old EKG Reviewed: unchanged Labs Reviewed  CBC WITH DIFFERENTIAL - Abnormal; Notable for the following:    Neutrophils Relative 41 (*)     Neutro Abs 1.6 (*)     Lymphocytes Relative 48 (*)     All other components within normal limits  BASIC METABOLIC PANEL  POCT I-STAT TROPONIN I  LAB REPORT - SCANNED           Remi Haggard,  NP 02/26/12 1159

## 2012-02-25 NOTE — ED Notes (Signed)
Patient remains alert and oriented.  Info provided

## 2012-02-28 NOTE — ED Provider Notes (Signed)
Medical screening examination/treatment/procedure(s) were performed by non-physician practitioner and as supervising physician I was immediately available for consultation/collaboration.   Dione Booze, MD 02/28/12 [phone]

## 2012-06-15 ENCOUNTER — Encounter (HOSPITAL_COMMUNITY): Payer: Self-pay | Admitting: Emergency Medicine

## 2012-06-15 ENCOUNTER — Emergency Department (HOSPITAL_COMMUNITY)
Admission: EM | Admit: 2012-06-15 | Discharge: 2012-06-15 | Disposition: A | Discharge status: Home / Self Care | Payer: 59 | Attending: Emergency Medicine | Admitting: Emergency Medicine

## 2012-06-15 DIAGNOSIS — H5789 Other specified disorders of eye and adnexa: Secondary | ICD-10-CM | POA: Insufficient documentation

## 2012-06-15 DIAGNOSIS — Z8659 Personal history of other mental and behavioral disorders: Secondary | ICD-10-CM | POA: Insufficient documentation

## 2012-06-15 DIAGNOSIS — J029 Acute pharyngitis, unspecified: Secondary | ICD-10-CM | POA: Insufficient documentation

## 2012-06-15 DIAGNOSIS — Z8742 Personal history of other diseases of the female genital tract: Secondary | ICD-10-CM | POA: Insufficient documentation

## 2012-06-15 DIAGNOSIS — H109 Unspecified conjunctivitis: Secondary | ICD-10-CM

## 2012-06-15 DIAGNOSIS — H5710 Ocular pain, unspecified eye: Secondary | ICD-10-CM | POA: Insufficient documentation

## 2012-06-15 DIAGNOSIS — R0982 Postnasal drip: Secondary | ICD-10-CM | POA: Insufficient documentation

## 2012-06-15 DIAGNOSIS — J3489 Other specified disorders of nose and nasal sinuses: Secondary | ICD-10-CM | POA: Insufficient documentation

## 2012-06-15 MED ORDER — FLUORESCEIN SODIUM 1 MG OP STRP
1.0000 | ORAL_STRIP | Freq: Once | OPHTHALMIC | Status: AC
  Administered 2012-06-15: 08:00:00 via OPHTHALMIC
  Filled 2012-06-15: qty 1

## 2012-06-15 MED ORDER — POLYMYXIN B-TRIMETHOPRIM 10000-0.1 UNIT/ML-% OP SOLN
1.0000 [drp] | OPHTHALMIC | Status: DC
  Administered 2012-06-15: 1 [drp] via OPHTHALMIC
  Filled 2012-06-15: qty 10

## 2012-06-15 MED ORDER — PROPARACAINE HCL 0.5 % OP SOLN
1.0000 [drp] | Freq: Once | OPHTHALMIC | Status: AC
  Administered 2012-06-15: 1 [drp] via OPHTHALMIC
  Filled 2012-06-15: qty 15

## 2012-06-15 NOTE — ED Provider Notes (Signed)
Medical screening examination/treatment/procedure(s) were performed by non-physician practitioner and as supervising physician I was immediately available for consultation/collaboration.   Joya Gaskins, MD 06/15/12 2258

## 2012-06-15 NOTE — ED Provider Notes (Signed)
History     CSN: [id number]  Arrival date & time 06/15/12  0704   First MD Initiated Contact with Patient 06/15/12 [phone]      Chief Complaint  Patient presents with  . eye infection     (Consider location/radiation/quality/duration/timing/severity/associated sxs/prior treatment) HPI  37 year old female presents with cold symptoms. Patient reports for the past 2 days she has had eyes irritation, nasal congestion, and sore throat.  Sts her eyes are red, itchy, irritated, tearing and matted shut in the morning.  Her sxs has been gradual, persistent, moderate in severity, and worsen.  No fever, chills, headache, diplopia, pressure behind eye, loss of vision. No photophobia. Reports that both of her kids were recently with sinus infection.  Pt however denies sneeze, cough, sinus pain, or rash.  No specific treatment tried.  Pt does not wear contact lens.  No new pets, detergent, soap, or other environmental changes  Past Medical History  Diagnosis Date  . No pertinent past medical history   . History of chlamydia   . HSV (herpes simplex virus) anogenital infection   . Anxiety     Past Surgical History  Procedure Date  . No past surgeries   . Colposcopy 2010  . Dilation and curettage of uterus     Family History  Problem Relation Age of Onset  . Stroke Mother   . Diabetes Father   . Thyroid disease Maternal Aunt   . Diabetes Maternal Grandmother     History  Substance Use Topics  . Smoking status: Never Smoker   . Smokeless tobacco: Not on file  . Alcohol Use: No    OB History    Grav Para Term Preterm Abortions TAB SAB Ect Mult Living   5 3 3  0 2 0 0 0 0 3      Review of Systems  Constitutional: Negative for fever and chills.  HENT: Positive for congestion, sore throat and postnasal drip. Negative for ear pain and neck pain.   Eyes: Positive for pain, discharge, redness and itching. Negative for photophobia and visual disturbance.  Gastrointestinal: Negative for  nausea.  Skin: Negative for rash.  Neurological: Negative for numbness.    Allergies  Review of patient's allergies indicates no known allergies.  Home Medications  No current outpatient prescriptions on file.  BP 107/69  Pulse 68  Temp 97.6 F (36.4 C) (Oral)  Resp 16  SpO2 100%  Physical Exam  Nursing note and vitals reviewed. Constitutional: She is oriented to person, place, and time. She appears well-developed and well-nourished. No distress.  HENT:  Head: Normocephalic and atraumatic.  Right Ear: External ear normal.  Left Ear: External ear normal.  Nose: Nose normal.  Mouth/Throat: Oropharynx is clear and moist. No oropharyngeal exudate.  Eyes: EOM are normal. Pupils are equal, round, and reactive to light. No foreign bodies found. Right eye exhibits discharge. Right eye exhibits no chemosis, no exudate and no hordeolum. No foreign body present in the right eye. Left eye exhibits discharge. Left eye exhibits no chemosis, no exudate and no hordeolum. No foreign body present in the left eye. Right conjunctiva is injected. Right conjunctiva has no hemorrhage. Left conjunctiva is injected. Left conjunctiva has no hemorrhage. No scleral icterus.  Slit lamp exam:      The right eye shows no corneal abrasion, no corneal flare, no corneal ulcer, no foreign body, no fluorescein uptake and no anterior chamber bulge.       The left eye shows no corneal  abrasion, no corneal flare, no corneal ulcer, no foreign body, no fluorescein uptake and no anterior chamber bulge.  Neck: Normal range of motion. Neck supple.  Cardiovascular: Normal rate and regular rhythm.   Pulmonary/Chest: Effort normal and breath sounds normal.  Lymphadenopathy:    She has no cervical adenopathy.  Neurological: She is alert and oriented to person, place, and time.  Skin: No rash noted.  Psychiatric: She has a normal mood and affect.    ED Course  Procedures (including critical care time)  07:43 Visual  Acuity DS Visual Acuity - Bilateral Near: 20/20 ; R Near: 20/20 ; L Near: 20/20  1. conjunctivitis  MDM  Pt presents with cold sxs and eyes irritation.  Eyes are injected and teary bilaterally.  Normal visual acuity, no fb, felt better after receiving proparacaine.  No corneal abrasions or ulceration.  Will d/c with polytrim.  Eye specialist referred as needed.    BP 107/69  Pulse 68  Temp 97.6 F (36.4 C) (Oral)  Resp 16  SpO2 100%         Fayrene Helper, PA-C 06/15/12 [phone]

## 2012-06-15 NOTE — ED Notes (Signed)
Right eye started itching, hurting--two days ago- now left eye is red and itchy- both eyes draining, sore throat, nasal congestion. Both kids have recently had sinus infection.

## 2012-07-26 ENCOUNTER — Emergency Department (INDEPENDENT_AMBULATORY_CARE_PROVIDER_SITE_OTHER)
Admission: EM | Admit: 2012-07-26 | Discharge: 2012-07-26 | Disposition: A | Discharge status: Home / Self Care | Payer: 59 | Source: Home / Self Care | Attending: Family Medicine | Admitting: Family Medicine

## 2012-07-26 ENCOUNTER — Encounter (HOSPITAL_COMMUNITY): Payer: Self-pay | Admitting: Emergency Medicine

## 2012-07-26 DIAGNOSIS — S139XXA Sprain of joints and ligaments of unspecified parts of neck, initial encounter: Secondary | ICD-10-CM

## 2012-07-26 DIAGNOSIS — S161XXA Strain of muscle, fascia and tendon at neck level, initial encounter: Secondary | ICD-10-CM

## 2012-07-26 MED ORDER — CYCLOBENZAPRINE HCL 5 MG PO TABS: 5.0000 mg | ORAL_TABLET | Freq: Three times a day (TID) | ORAL | Status: DC | PRN

## 2012-07-26 MED ORDER — IBUPROFEN 800 MG PO TABS: 800.0000 mg | ORAL_TABLET | Freq: Three times a day (TID) | ORAL | Status: DC

## 2012-07-26 NOTE — ED Provider Notes (Signed)
History     CSN: [id number]  Arrival date & time 07/26/12  1026   First MD Initiated Contact with Patient 07/26/12 1034      Chief Complaint  Patient presents with  . Fall    (Consider location/radiation/quality/duration/timing/severity/associated sxs/prior treatment) Patient is a 37 y.o. female presenting with fall. The history is provided by the patient and a parent.  Fall The accident occurred 1 to 2 hours ago. The fall occurred while running. Distance fallen: slipped and fell down 8 steps, reaching to grab railing., c/o right neck pain., no loc, no neuro sx. She landed on concrete. The point of impact was the head and neck. The pain is present in the neck. The pain is mild. She was ambulatory at the scene. Pertinent negatives include no numbness, no headaches and no loss of consciousness. The symptoms are aggravated by rotation.    Past Medical History  Diagnosis Date  . No pertinent past medical history   . History of chlamydia   . HSV (herpes simplex virus) anogenital infection   . Anxiety     Past Surgical History  Procedure Laterality Date  . No past surgeries    . Colposcopy  2010  . Dilation and curettage of uterus      Family History  Problem Relation Age of Onset  . Stroke Mother   . Diabetes Father   . Thyroid disease Maternal Aunt   . Diabetes Maternal Grandmother     History  Substance Use Topics  . Smoking status: Never Smoker   . Smokeless tobacco: Not on file  . Alcohol Use: No    OB History   Grav Para Term Preterm Abortions TAB SAB Ect Mult Living   5 3 3  0 2 0 0 0 0 3      Review of Systems  Constitutional: Negative.   HENT: Positive for neck pain.   Respiratory: Negative.   Cardiovascular: Negative.   Gastrointestinal: Negative.   Genitourinary: Negative.   Neurological: Negative for loss of consciousness, numbness and headaches.    Allergies  Review of patient's allergies indicates no known allergies.  Home Medications    Current Outpatient Rx  Name  Route  Sig  Dispense  Refill  . cyclobenzaprine (FLEXERIL) 5 MG tablet   Oral   Take 1 tablet (5 mg total) by mouth 3 (three) times daily as needed for muscle spasms.   30 tablet   0   . ibuprofen (ADVIL,MOTRIN) 800 MG tablet   Oral   Take 1 tablet (800 mg total) by mouth 3 (three) times daily.   30 tablet   0     BP 116/81  Pulse 75  Temp(Src) 98.3 F (36.8 C) (Oral)  Resp 18  SpO2 96%  Physical Exam  Nursing note and vitals reviewed. Constitutional: She is oriented to person, place, and time. She appears well-developed and well-nourished. No distress.  HENT:  Head: Normocephalic and atraumatic.  Eyes: Pupils are equal, round, and reactive to light.  Neck: Neck supple. Muscular tenderness present. No rigidity. Decreased range of motion present.    Abdominal: There is no tenderness.  Musculoskeletal: She exhibits no tenderness.  Neurological: She is alert and oriented to person, place, and time.  Skin: Skin is warm and dry.    ED Course  Procedures (including critical care time)  Labs Reviewed - No data to display No results found.   1. Cervical strain, initial encounter       MDM  Linna Hoff, MD 07/26/12 1154

## 2012-07-26 NOTE — ED Notes (Signed)
Reports a fall this am, running down steps, slipped and fell.  Fell down 8 steps, reports hitting back of head, neck, right shoulder and back between shoulder blades.  No loc.

## 2012-09-25 ENCOUNTER — Inpatient Hospital Stay (HOSPITAL_COMMUNITY): Payer: 59

## 2012-09-25 ENCOUNTER — Inpatient Hospital Stay (HOSPITAL_COMMUNITY)
Admission: AD | Admit: 2012-09-25 | Discharge: 2012-09-25 | Disposition: A | Discharge status: Home / Self Care | Payer: 59 | Source: Ambulatory Visit | Attending: Obstetrics & Gynecology | Admitting: Obstetrics & Gynecology

## 2012-09-25 ENCOUNTER — Encounter (HOSPITAL_COMMUNITY): Payer: Self-pay | Admitting: *Deleted

## 2012-09-25 DIAGNOSIS — A5901 Trichomonal vulvovaginitis: Secondary | ICD-10-CM

## 2012-09-25 DIAGNOSIS — R51 Headache: Secondary | ICD-10-CM | POA: Insufficient documentation

## 2012-09-25 DIAGNOSIS — R109 Unspecified abdominal pain: Secondary | ICD-10-CM | POA: Insufficient documentation

## 2012-09-25 DIAGNOSIS — D72819 Decreased white blood cell count, unspecified: Secondary | ICD-10-CM | POA: Insufficient documentation

## 2012-09-25 DIAGNOSIS — N730 Acute parametritis and pelvic cellulitis: Secondary | ICD-10-CM

## 2012-09-25 DIAGNOSIS — B349 Viral infection, unspecified: Secondary | ICD-10-CM

## 2012-09-25 LAB — URINALYSIS, ROUTINE W REFLEX MICROSCOPIC
Bilirubin Urine: NEGATIVE
Glucose, UA: NEGATIVE mg/dL
Hgb urine dipstick: NEGATIVE
Ketones, ur: NEGATIVE mg/dL
Protein, ur: NEGATIVE mg/dL
pH: 8 (ref 5.0–8.0)

## 2012-09-25 LAB — CBC
HCT: 40.9 % (ref 36.0–46.0)
Hemoglobin: 14.1 g/dL (ref 12.0–15.0)
MCH: 31.9 pg (ref 26.0–34.0)
MCHC: 34.5 g/dL (ref 30.0–36.0)
MCV: 92.5 fL (ref 78.0–100.0)

## 2012-09-25 LAB — COMPREHENSIVE METABOLIC PANEL
Alkaline Phosphatase: 50 U/L (ref 39–117)
BUN: 8 mg/dL (ref 6–23)
Calcium: 10 mg/dL (ref 8.4–10.5)
Creatinine, Ser: 0.77 mg/dL (ref 0.50–1.10)
GFR calc Af Amer: >90 mL/min (ref >90)
Glucose, Bld: 83 mg/dL (ref 70–99)
Potassium: 3.6 mEq/L (ref 3.5–5.1)
Total Protein: 7.4 g/dL (ref 6.0–8.3)

## 2012-09-25 LAB — RAPID HIV SCREEN (WH-MAU): Rapid HIV Screen: NONREACTIVE

## 2012-09-25 LAB — DIFFERENTIAL
Lymphocytes Relative: 42 % (ref 12–46)
Monocytes Absolute: 0.5 10*3/uL (ref 0.1–1.0)
Monocytes Relative: 13 % — ABNORMAL HIGH (ref 3–12)
Neutro Abs: 1.6 10*3/uL — ABNORMAL LOW (ref 1.7–7.7)

## 2012-09-25 LAB — POCT PREGNANCY, URINE: Preg Test, Ur: NEGATIVE

## 2012-09-25 LAB — WET PREP, GENITAL

## 2012-09-25 LAB — RAPID URINE DRUG SCREEN, HOSP PERFORMED
Amphetamines: NOT DETECTED
Benzodiazepines: NOT DETECTED

## 2012-09-25 MED ORDER — DOXYCYCLINE HYCLATE 50 MG PO CAPS: 100.0000 mg | ORAL_CAPSULE | Freq: Two times a day (BID) | ORAL | Status: DC

## 2012-09-25 MED ORDER — KETOROLAC TROMETHAMINE 60 MG/2ML IM SOLN: 30.0000 mg | Freq: Once | INTRAMUSCULAR | Status: DC

## 2012-09-25 MED ORDER — METRONIDAZOLE 500 MG PO TABS: 500.0000 mg | ORAL_TABLET | Freq: Two times a day (BID) | ORAL | Status: DC

## 2012-09-25 MED ORDER — FLUCONAZOLE 150 MG PO TABS: 150.0000 mg | ORAL_TABLET | Freq: Once | ORAL | Status: DC

## 2012-09-25 MED ORDER — AZITHROMYCIN 1 G PO PACK: 1.0000 | PACK | Freq: Once | ORAL | Status: DC

## 2012-09-25 MED ORDER — AZITHROMYCIN 250 MG PO TABS
1000.0000 mg | ORAL_TABLET | Freq: Once | ORAL | Status: AC
  Administered 2012-09-25: 1000 mg via ORAL
  Filled 2012-09-25: qty 4

## 2012-09-25 MED ORDER — ONDANSETRON HCL 4 MG PO TABS
8.0000 mg | ORAL_TABLET | Freq: Once | ORAL | Status: AC
  Administered 2012-09-25: 8 mg via ORAL
  Filled 2012-09-25: qty 1

## 2012-09-25 MED ORDER — KETOROLAC TROMETHAMINE 60 MG/2ML IM SOLN
30.0000 mg | Freq: Once | INTRAMUSCULAR | Status: AC
  Administered 2012-09-25: 30 mg via INTRAMUSCULAR

## 2012-09-25 MED ORDER — KETOROLAC TROMETHAMINE 30 MG/ML IJ SOLN
30.0000 mg | Freq: Once | INTRAMUSCULAR | Status: DC
  Filled 2012-09-25: qty 1

## 2012-09-25 MED ORDER — CEFTRIAXONE SODIUM 250 MG IJ SOLR
250.0000 mg | Freq: Once | INTRAMUSCULAR | Status: AC
  Administered 2012-09-25: 250 mg via INTRAMUSCULAR
  Filled 2012-09-25: qty 250

## 2012-09-25 MED ORDER — METRONIDAZOLE 500 MG PO TABS
1000.0000 mg | ORAL_TABLET | Freq: Once | ORAL | Status: AC
  Administered 2012-09-25: 1000 mg via ORAL
  Filled 2012-09-25: qty 2

## 2012-09-25 NOTE — MAU Provider Note (Signed)
History     CSN: [id number]  Arrival date and time: 09/25/12 0807   First Provider Initiated Contact with Patient 09/25/12 [phone]      Chief Complaint  Patient presents with  . Abdominal Pain   HPI This is a 37 y.o. female who presents with 2 week history of "feeling bad".  Has lower abdominal pain, fever, malaise, and "swollen glands".  Feels tired all the time Has not gone to [facility] because she "could not miss work". Denies bleeding or other GYN symptoms other than discharge and pelvic pain.   OB History   Grav Para Term Preterm Abortions TAB SAB Ect Mult Living   5 3 3  0 2 0 0 0 0 3      Past Medical History  Diagnosis Date  . No pertinent past medical history   . History of chlamydia   . HSV (herpes simplex virus) anogenital infection   . Anxiety     Past Surgical History  Procedure Laterality Date  . No past surgeries    . Colposcopy  2010  . Dilation and curettage of uterus      Family History  Problem Relation Age of Onset  . Stroke Mother   . Diabetes Father   . Thyroid disease Maternal Aunt   . Diabetes Maternal Grandmother     History  Substance Use Topics  . Smoking status: Never Smoker   . Smokeless tobacco: Not on file  . Alcohol Use: No    Allergies: No Known Allergies  Prescriptions prior to admission  Medication Sig Dispense Refill  . diphenhydrAMINE (BENADRYL) 25 MG tablet Take 25 mg by mouth at bedtime as needed for allergies.      Marland Kitchen ibuprofen (ADVIL,MOTRIN) 200 MG tablet Take 400 mg by mouth every 8 (eight) hours as needed for pain.        Review of Systems  Constitutional: Positive for fever (For Several Months(was 103 2 mos ago)), weight loss and malaise/fatigue. Negative for chills.  HENT: Positive for neck pain (from lymph nodes swollen).   Respiratory: Negative for cough.   Cardiovascular: Negative for chest pain.  Gastrointestinal: Positive for abdominal pain. Negative for heartburn, nausea, vomiting, diarrhea and  constipation.  Genitourinary: Negative for dysuria.  Musculoskeletal: Positive for myalgias.  Neurological: Positive for weakness. Negative for headaches.  Psychiatric/Behavioral: The patient is nervous/anxious.     Physical Exam   Blood pressure 116/59, pulse 98, temperature 97.9 F (36.6 C), temperature source Oral, resp. rate 18, height 5\' 5"  (1.651 m), weight 110 lb 3.2 oz (49.986 kg), last menstrual period 09/18/2012.  Physical Exam  Constitutional: She is oriented to person, place, and time. She appears well-developed and well-nourished. No distress.  HENT:  Head: Normocephalic.  Neck: Normal range of motion. Neck supple.  Cardiovascular: Normal rate, regular rhythm and normal heart sounds.   Respiratory: Effort normal and breath sounds normal. No respiratory distress. She has no wheezes. She has no rales.  GI: Soft. She exhibits no distension and no mass. There is tenderness (over lower pelvis). There is guarding. There is no rebound.  Genitourinary: Uterus normal. Vaginal discharge (copious white) found.  Musculoskeletal: Normal range of motion.  Lymphadenopathy:    She has cervical adenopathy (Diffuse enlargement along right sternocleomastoid, with several firm nodes just under mandible).  Neurological: She is alert and oriented to person, place, and time.  Skin: Skin is warm and dry.  Psychiatric: She has a normal mood and affect.   Results for  orders placed during the hospital encounter of 09/25/12 (from the past 24 hour(s))  WET PREP, GENITAL     Status: Abnormal   Collection Time    09/25/12  9:00 AM      Result Value Range   Yeast Wet Prep HPF POC NONE SEEN  NONE SEEN   Trich, Wet Prep MODERATE (*) NONE SEEN   Clue Cells Wet Prep HPF POC FEW (*) NONE SEEN   WBC, Wet Prep HPF POC FEW (*) NONE SEEN  CBC     Status: Abnormal   Collection Time    09/25/12  9:19 AM      Result Value Range   WBC 3.8 (*) 4.0 - 10.5 K/uL   RBC 4.42  3.87 - 5.11 MIL/uL   Hemoglobin  14.1  12.0 - 15.0 g/dL   HCT 86.5  78.4 - 69.6 %   MCV 92.5  78.0 - 100.0 fL   MCH 31.9  26.0 - 34.0 pg   MCHC 34.5  30.0 - 36.0 g/dL   RDW 29.5  28.4 - 13.2 %   Platelets 299  150 - 400 K/uL  COMPREHENSIVE METABOLIC PANEL     Status: Abnormal   Collection Time    09/25/12  9:19 AM      Result Value Range   Sodium 142  135 - 145 mEq/L   Potassium 3.6  3.5 - 5.1 mEq/L   Chloride 104  96 - 112 mEq/L   CO2 30  19 - 32 mEq/L   Glucose, Bld 83  70 - 99 mg/dL   BUN 8  6 - 23 mg/dL   Creatinine, Ser 4.40  0.50 - 1.10 mg/dL   Calcium 10.2  8.4 - 72.5 mg/dL   Total Protein 7.4  6.0 - 8.3 g/dL   Albumin 4.1  3.5 - 5.2 g/dL   AST 14  0 - 37 U/L   ALT 11  0 - 35 U/L   Alkaline Phosphatase 50  39 - 117 U/L   Total Bilirubin 2.3 (*) 0.3 - 1.2 mg/dL   GFR calc non Af Amer >90  >90 mL/min   GFR calc Af Amer >90  >90 mL/min  DIFFERENTIAL     Status: Abnormal   Collection Time    09/25/12  9:19 AM      Result Value Range   Neutrophils Relative 43  43 - 77 %   Neutro Abs 1.6 (*) 1.7 - 7.7 K/uL   Lymphocytes Relative 42  12 - 46 %   Lymphs Abs 1.5  0.7 - 4.0 K/uL   Monocytes Relative 13 (*) 3 - 12 %   Monocytes Absolute 0.5  0.1 - 1.0 K/uL   Eosinophils Relative 2  0 - 5 %   Eosinophils Absolute 0.1  0.0 - 0.7 K/uL   Basophils Relative 1  0 - 1 %   Basophils Absolute 0.0  0.0 - 0.1 K/uL  MONONUCLEOSIS SCREEN     Status: None   Collection Time    09/25/12  9:20 AM      Result Value Range   Mono Screen NEGATIVE  NEGATIVE  RAPID HIV SCREEN ([facility])     Status: None   Collection Time    09/25/12  9:20 AM      Result Value Range   SUDS Rapid HIV Screen NON REACTIVE  NON REACTIVE  SEDIMENTATION RATE     Status: None   Collection Time    09/25/12  9:20 AM  Result Value Range   Sed Rate 2  0 - 22 mm/hr  RPR     Status: None   Collection Time    09/25/12  9:20 AM      Result Value Range   RPR NON REACTIVE  NON REACTIVE  URINALYSIS, ROUTINE W REFLEX MICROSCOPIC     Status: Abnormal    Collection Time    09/25/12 10:30 AM      Result Value Range   Color, Urine YELLOW  YELLOW   APPearance CLEAR  CLEAR   Specific Gravity, Urine 1.020  1.005 - 1.030   pH 8.0  5.0 - 8.0   Glucose, UA NEGATIVE  NEGATIVE mg/dL   Hgb urine dipstick NEGATIVE  NEGATIVE   Bilirubin Urine NEGATIVE  NEGATIVE   Ketones, ur NEGATIVE  NEGATIVE mg/dL   Protein, ur NEGATIVE  NEGATIVE mg/dL   Urobilinogen, UA 2.0 (*) 0.0 - 1.0 mg/dL   Nitrite NEGATIVE  NEGATIVE   Leukocytes, UA NEGATIVE  NEGATIVE  URINE RAPID DRUG SCREEN (HOSP PERFORMED)     Status: None   Collection Time    09/25/12 10:30 AM      Result Value Range   Opiates NONE DETECTED  NONE DETECTED   Cocaine NONE DETECTED  NONE DETECTED   Benzodiazepines NONE DETECTED  NONE DETECTED   Amphetamines NONE DETECTED  NONE DETECTED   Tetrahydrocannabinol NONE DETECTED  NONE DETECTED   Barbiturates NONE DETECTED  NONE DETECTED  POCT PREGNANCY, URINE     Status: None   Collection Time    09/25/12 10:56 AM      Result Value Range   Preg Test, Ur NEGATIVE  NEGATIVE     MAU Course  Procedures  MDM  US Pelvis Complete  09/25/2012  *RADIOLOGY REPORT*  Clinical Data: Pelvic pain, IUD  TRANSABDOMINAL AND TRANSVAGINAL ULTRASOUND OF PELVIS Technique:  Both transabdominal and transvaginal ultrasound examinations of the pelvis were performed. Transabdominal technique was performed for global imaging of the pelvis including uterus, ovaries, adnexal regions, and pelvic cul-de-sac.  It was necessary to proceed with endovaginal exam following the transabdominal exam to visualize the endometrium.  Comparison:  None  Findings:  Uterus: Normal in size and appearance, measuring 8.8 x 3.9 x 4.7 cm.  Endometrium: IUD in satisfactory position, although the arms of the IUD may be protruding into the myometrium.  Right ovary:  Measures 3.6 x 2.9 x 3.7 cm and is notable for a 2.5 x 2.5 x 2.7 cm simple cyst.  Left ovary: Measures 3.6 x 1.8 x 2.2 cm and is notable for  multiple small follicles.  Other findings: No free fluid.  IMPRESSION: IUD in satisfactory position, although the arms of the IUD may be protruding into the myometrium.  2.7 cm right ovarian cyst, simple.   Original Report Authenticated By: Charline Bills, M.D.     Assessment and Plan  A:  Trichomonas infection      Probable PID      Mild Leukopenia over past year       ?sytemic illness      Negative Mono or HIV  P:  Given treatment for presumed PID       Flagyl for Trich       Will refer back to [facility] for Medical Evaluation   [facility] 09/25/2012, 9:42 AM

## 2012-09-25 NOTE — MAU Note (Signed)
Pt reports having abd cramping on and off for 2 weeks. Pt reports haivng a headache since Wednesday. Taking benadryl thinkng it was because of sinus allergies. No releif.

## 2012-09-26 LAB — GC/CHLAMYDIA PROBE AMP
CT Probe RNA: NEGATIVE
GC Probe RNA: NEGATIVE

## 2012-09-27 NOTE — MAU Provider Note (Signed)
Attestation of Attending Supervision of Advanced Practitioner: Evaluation and management procedures were performed by the PA/NP/CNM/OB Fellow under my supervision/collaboration. Chart reviewed and agree with management and plan.  Tilda Burrow 09/27/2012 12:11 PM  Attestation of Attending Supervision of Advanced Practitioner: Evaluation and management procedures were performed by the PA/NP/CNM/OB Fellow under my supervision/collaboration. Chart reviewed and agree with management and plan. Case was discussed during evaluation and treatement process.  Zalman Hull V 09/27/2012 12:11 PM

## 2013-03-06 ENCOUNTER — Encounter (HOSPITAL_COMMUNITY): Payer: Self-pay | Admitting: *Deleted

## 2013-03-06 ENCOUNTER — Inpatient Hospital Stay (HOSPITAL_COMMUNITY): Payer: 59

## 2013-03-06 ENCOUNTER — Inpatient Hospital Stay (HOSPITAL_COMMUNITY)
Admission: AD | Admit: 2013-03-06 | Discharge: 2013-03-06 | Disposition: A | Discharge status: Home / Self Care | Payer: 59 | Source: Ambulatory Visit | Attending: Obstetrics and Gynecology | Admitting: Obstetrics and Gynecology

## 2013-03-06 DIAGNOSIS — R1031 Right lower quadrant pain: Secondary | ICD-10-CM | POA: Insufficient documentation

## 2013-03-06 DIAGNOSIS — N83209 Unspecified ovarian cyst, unspecified side: Secondary | ICD-10-CM | POA: Insufficient documentation

## 2013-03-06 DIAGNOSIS — B9689 Other specified bacterial agents as the cause of diseases classified elsewhere: Secondary | ICD-10-CM | POA: Insufficient documentation

## 2013-03-06 DIAGNOSIS — A499 Bacterial infection, unspecified: Secondary | ICD-10-CM

## 2013-03-06 DIAGNOSIS — N760 Acute vaginitis: Secondary | ICD-10-CM | POA: Insufficient documentation

## 2013-03-06 HISTORY — DX: Unspecified abnormal cytological findings in specimens from cervix uteri: R87.619

## 2013-03-06 HISTORY — DX: Reserved for concepts with insufficient information to code with codable children: IMO0002

## 2013-03-06 LAB — CBC
HCT: 39.2 % (ref 36.0–46.0)
MCHC: 34.4 g/dL (ref 30.0–36.0)
MCV: 91.6 fL (ref 78.0–100.0)
Platelets: 294 10*3/uL (ref 150–400)
RDW: 12.4 % (ref 11.5–15.5)

## 2013-03-06 LAB — WET PREP, GENITAL
Trich, Wet Prep: NONE SEEN
Yeast Wet Prep HPF POC: NONE SEEN

## 2013-03-06 LAB — URINALYSIS, ROUTINE W REFLEX MICROSCOPIC
Bilirubin Urine: NEGATIVE
Hgb urine dipstick: NEGATIVE
Specific Gravity, Urine: >1.03 — ABNORMAL HIGH (ref 1.005–1.030)
pH: 6 (ref 5.0–8.0)

## 2013-03-06 LAB — POCT PREGNANCY, URINE: Preg Test, Ur: NEGATIVE

## 2013-03-06 MED ORDER — METRONIDAZOLE 500 MG PO TABS: 500.0000 mg | ORAL_TABLET | Freq: Two times a day (BID) | ORAL | Status: DC

## 2013-03-06 MED ORDER — FLUCONAZOLE 150 MG PO TABS: 150.0000 mg | ORAL_TABLET | Freq: Once | ORAL | Status: DC

## 2013-03-06 NOTE — MAU Note (Signed)
Pt also states she has breast tenderness & occasional nausea.

## 2013-03-06 NOTE — MAU Provider Note (Signed)
History     CSN: [id number]  Arrival date and time: 03/06/13 7829   First Provider Initiated Contact with Patient 03/06/13 [phone]      Chief Complaint  Patient presents with  . Abdominal Pain   HPI [name] is a 37 y.o. [phone] who presents to MAU today with complaint of RLQ pain and vaginal discharge. The patient state that the pain has been constant x weeks, but becomes more severe with ambulation and sexual intercourse. She rates her pain at 6/10 now and 10/10 at the worst. She denies any use of pain medication. She also notes a thin, clear, watery discharge x 1 week. She states occasional nausea without vomiting, diarrhea or constipation. She denies fever, vaginal bleeding or UTI symptoms. She has an IUD x 2 years.    OB History   Grav Para Term Preterm Abortions TAB SAB Ect Mult Living   5 3 3  0 2 0 0 0 0 3      Past Medical History  Diagnosis Date  . No pertinent past medical history   . History of chlamydia   . HSV (herpes simplex virus) anogenital infection   . Anxiety   . Abnormal Pap smear     Past Surgical History  Procedure Laterality Date  . No past surgeries    . Colposcopy  2010  . Dilation and curettage of uterus      Family History  Problem Relation Age of Onset  . Stroke Mother   . Diabetes Father   . Thyroid disease Maternal Aunt   . Diabetes Maternal Grandmother     History  Substance Use Topics  . Smoking status: Never Smoker   . Smokeless tobacco: Not on file  . Alcohol Use: No    Allergies: No Known Allergies  Prescriptions prior to admission  Medication Sig Dispense Refill  . BIOTIN PO Take 1 tablet by mouth daily.      . diphenhydrAMINE (BENADRYL) 25 MG tablet Take 25 mg by mouth at bedtime as needed for allergies.      Marland Kitchen doxylamine, Sleep, (UNISOM) 25 MG tablet Take 25 mg by mouth at bedtime as needed for sleep.      Marland Kitchen ibuprofen (ADVIL,MOTRIN) 200 MG tablet Take 400 mg by mouth every 8 (eight) hours as needed for pain.       Marland Kitchen levonorgestrel (MIRENA) 20 MCG/24HR IUD 1 each by Intrauterine route once.      . Multiple Vitamin (MULTIVITAMIN WITH MINERALS) TABS tablet Take 1 tablet by mouth daily.        Review of Systems  Constitutional: Positive for malaise/fatigue. Negative for fever.  Gastrointestinal: Positive for nausea and abdominal pain. Negative for vomiting, diarrhea and constipation.  Genitourinary: Negative for dysuria, urgency and frequency.       Neg - vaginal bleeding + discharge  Neurological: Positive for weakness.   Physical Exam   Blood pressure 108/64, pulse 69, temperature 97.9 F (36.6 C), temperature source Oral, resp. rate 16, height 5' 5.5" (1.664 m), weight 113 lb (51.256 kg).  Physical Exam  Constitutional: She is oriented to person, place, and time. She appears well-developed and well-nourished. No distress.  HENT:  Head: Normocephalic and atraumatic.  Cardiovascular: Normal rate, regular rhythm and normal heart sounds.   Respiratory: Effort normal and breath sounds normal. No respiratory distress.  GI: Soft. Bowel sounds are normal. She exhibits no distension and no mass. There is tenderness (mild tenderness to palpation to the RLQ). There is  no rebound and no guarding.  Genitourinary: Uterus is tender (mild to moderate tenderness to palpation). Uterus is not enlarged. Cervix exhibits no motion tenderness, no discharge and no friability. Right adnexum displays tenderness (mild to moderate tenderness to palpation). Right adnexum displays no mass. Left adnexum displays no mass and no tenderness. No bleeding around the vagina. Vaginal discharge (scant watery, yellow-brown discharge noted) found.  Neurological: She is alert and oriented to person, place, and time.  Skin: Skin is warm and dry. No erythema.  Psychiatric: She has a normal mood and affect.   Results for orders placed during the hospital encounter of 03/06/13 (from the past 24 hour(s))  URINALYSIS, ROUTINE W REFLEX  MICROSCOPIC     Status: Abnormal   Collection Time    03/06/13  8:55 AM      Result Value Range   Color, Urine YELLOW  YELLOW   APPearance CLEAR  CLEAR   Specific Gravity, Urine >1.030 (*) 1.005 - 1.030   pH 6.0  5.0 - 8.0   Glucose, UA NEGATIVE  NEGATIVE mg/dL   Hgb urine dipstick NEGATIVE  NEGATIVE   Bilirubin Urine NEGATIVE  NEGATIVE   Ketones, ur NEGATIVE  NEGATIVE mg/dL   Protein, ur NEGATIVE  NEGATIVE mg/dL   Urobilinogen, UA 0.2  0.0 - 1.0 mg/dL   Nitrite NEGATIVE  NEGATIVE   Leukocytes, UA NEGATIVE  NEGATIVE  WET PREP, GENITAL     Status: Abnormal   Collection Time    03/06/13  9:30 AM      Result Value Range   Yeast Wet Prep HPF POC NONE SEEN  NONE SEEN   Trich, Wet Prep NONE SEEN  NONE SEEN   Clue Cells Wet Prep HPF POC FEW (*) NONE SEEN   WBC, Wet Prep HPF POC FEW (*) NONE SEEN  POCT PREGNANCY, URINE     Status: None   Collection Time    03/06/13  9:41 AM      Result Value Range   Preg Test, Ur NEGATIVE  NEGATIVE  CBC     Status: None   Collection Time    03/06/13  9:54 AM      Result Value Range   WBC 5.8  4.0 - 10.5 K/uL   RBC 4.28  3.87 - 5.11 MIL/uL   Hemoglobin 13.5  12.0 - 15.0 g/dL   HCT 16.1  09.6 - 04.5 %   MCV 91.6  78.0 - 100.0 fL   MCH 31.5  26.0 - 34.0 pg   MCHC 34.4  30.0 - 36.0 g/dL   RDW 40.9  81.1 - 91.4 %   Platelets 294  150 - 400 K/uL   US Transvaginal Non-ob  03/06/2013   CLINICAL DATA:  Right lower quadrant pain for 2 weeks. Intrauterine device. Gravida 5 para 3.  EXAM: TRANSABDOMINAL AND TRANSVAGINAL ULTRASOUND OF PELVIS  TECHNIQUE: Both transabdominal and transvaginal ultrasound examinations of the pelvis were performed. Transabdominal technique was performed for global imaging of the pelvis including uterus, ovaries, adnexal regions, and pelvic cul-de-sac. It was necessary to proceed with endovaginal exam following the transabdominal exam to visualize the endometrium, ovaries.  COMPARISON:  09/25/2012  FINDINGS: Uterus  Measurements: 8.4  x 5.3 x 7.8 cm. Normal in no adnexal mass. Appearance. Intrauterine device is visualized.  Endometrium  Thickness: 2.6 mm in thickness.  No focal abnormality visualized.  Right ovary  Measurements: 4.1 x 3.4 x 3.3 cm. A simple cyst is 3.5 cm.  Left ovary  Measurements: 3.6  x 2.6 x 3.1 cm. A simple cyst is 3.0 cm.  Other findings  No free fluid.  IMPRESSION: 1. Intrauterine device in expected location. 2. Benign bilateral ovarian cysts. 3. No suspicious adnexal mass.   Electronically Signed   By: Rosalie Gums M.D.   On: 03/06/2013 12:04   US Pelvis Complete  03/06/2013   CLINICAL DATA:  Right lower quadrant pain for 2 weeks. Intrauterine device. Gravida 5 para 3.  EXAM: TRANSABDOMINAL AND TRANSVAGINAL ULTRASOUND OF PELVIS  TECHNIQUE: Both transabdominal and transvaginal ultrasound examinations of the pelvis were performed. Transabdominal technique was performed for global imaging of the pelvis including uterus, ovaries, adnexal regions, and pelvic cul-de-sac. It was necessary to proceed with endovaginal exam following the transabdominal exam to visualize the endometrium, ovaries.  COMPARISON:  09/25/2012  FINDINGS: Uterus  Measurements: 8.4 x 5.3 x 7.8 cm. Normal in no adnexal mass. Appearance. Intrauterine device is visualized.  Endometrium  Thickness: 2.6 mm in thickness.  No focal abnormality visualized.  Right ovary  Measurements: 4.1 x 3.4 x 3.3 cm. A simple cyst is 3.5 cm.  Left ovary  Measurements: 3.6 x 2.6 x 3.1 cm. A simple cyst is 3.0 cm.  Other findings  No free fluid.  IMPRESSION: 1. Intrauterine device in expected location. 2. Benign bilateral ovarian cysts. 3. No suspicious adnexal mass.   Electronically Signed   By: Rosalie Gums M.D.   On: 03/06/2013 12:04     MAU Course  Procedures None  MDM UPT - negative UA, Wet prep, GC/Chlamydia, CBC and [place] today Pelvic US normal Low suspicion for appendicitis without elevated WBC, fever, N/V or severe pain on exam Assessment and Plan   A: Bacterial vaginosis Benign ovarian cysts RLQ pain, most likely GI in origin  P: Discharge home Rx for Flagyl sent to patient's pharmacy Patient advised to return to PCP if symptoms persist or worsen may need GI referral Patient may return to MAU as needed, but unlikely that her pain is of GYN origin, may be best to follow-up with Urgent Care, [facility] or [facility]  Freddi Starr, PA-C  03/06/2013, 12:18 PM

## 2013-03-06 NOTE — MAU Note (Signed)
RLQ pain x 2 weeks, runny discharge also x 2 weeks, has IUD.

## 2013-03-08 NOTE — MAU Provider Note (Signed)
Attestation of Attending Supervision of Advanced Practitioner (CNM/NP): Evaluation and management procedures were performed by the Advanced Practitioner under my supervision and collaboration.  I have reviewed the Advanced Practitioner's note and chart, and I agree with the management and plan.  Lenore Moyano 03/08/2013 10:00 AM

## 2013-04-21 ENCOUNTER — Ambulatory Visit (INDEPENDENT_AMBULATORY_CARE_PROVIDER_SITE_OTHER): Payer: 59 | Admitting: Family Medicine

## 2013-04-21 ENCOUNTER — Encounter: Payer: Self-pay | Admitting: Family Medicine

## 2013-04-21 VITALS — BP 108/75 | HR 71 | Temp 98.1°F | Wt 111.0 lb

## 2013-04-21 DIAGNOSIS — R634 Abnormal weight loss: Secondary | ICD-10-CM

## 2013-04-21 DIAGNOSIS — Z7689 Persons encountering health services in other specified circumstances: Secondary | ICD-10-CM

## 2013-04-21 DIAGNOSIS — Z7189 Other specified counseling: Secondary | ICD-10-CM

## 2013-04-21 DIAGNOSIS — G4700 Insomnia, unspecified: Secondary | ICD-10-CM

## 2013-04-21 DIAGNOSIS — Z23 Encounter for immunization: Secondary | ICD-10-CM

## 2013-04-21 MED ORDER — MIRTAZAPINE 7.5 MG PO TABS
7.5000 mg | ORAL_TABLET | Freq: Every day | ORAL | Status: DC
Start: 2013-04-21 — End: 2013-06-29

## 2013-04-21 NOTE — Assessment & Plan Note (Signed)
Unhappy with her weight, would like to gain 10 pounds. Has been pretty consistently around 110lbs for the last few years per chart review.  Plan: -continue high calorie intake -Remeron 7.5mg  at night will help with mood, sleep, weight gain

## 2013-04-21 NOTE — Patient Instructions (Signed)
It was nice to meet you today.  Take the remeron 7.5mg  every night. This will help with sleep, weight gain, and mood.  Please schedule an appointment for 1 month from now for follow up.

## 2013-04-21 NOTE — Assessment & Plan Note (Signed)
Recently lost grandfather making this worse, but has been going on for at least the last few months. Has already cut out caffeine at night, decreased screen time (though still uses phone), tried to consistently go to bed at the same time. Plan: - continue with working on sleep hygiene - Remeron 7.5mg  at night (will help with weight gain, depressed mood)

## 2013-04-21 NOTE — Progress Notes (Signed)
  Subjective:    Patient ID: Dana Knight, female    DOB: 25-Jun-1975, 37 y.o.   MRN: [id number]  HPI  Here to re-establish care, previously seen by Dr. Deirdre Priest  # New patient - no current medical problems, has history of abnormal pap smear (CIN1 in 2008) with most recent being normal (2012) - does not take any regular medications - Tobacco: never smoker - EtOH: "once in a blue moon"  # Poor sleep / depressed - grandfather recently passed away (buried yesterday), she was very involved in his care - has been having poor sleep, lack of interest, feeling depressed - worried about grandmother's reaction - feels it has been going on for longer than the last month.  # Weight gain - wants to gain +10lbs - has been consistently around 105-110 since she has had children - has tried to increase appetite, eat high calorie shakes/supplements ROS: denies heat intolerance, does endorse some hair loss in the past, has noticed some tremors in her hands.  Social: Has 3 children, 11, 8, 2. Works at [employer].  Review of Systems  Constitutional: Positive for fatigue. Negative for activity change, appetite change and unexpected weight change.  HENT: Positive for congestion and rhinorrhea. Negative for sneezing, sore throat and voice change.   Respiratory: Negative for shortness of breath and wheezing.   Cardiovascular: Negative for chest pain, palpitations and leg swelling.  Gastrointestinal: Negative for nausea, abdominal pain, diarrhea and constipation.  Endocrine: Negative for cold intolerance and heat intolerance.  Genitourinary: Positive for vaginal bleeding (persistent small spotting since mirena implant after last child 2 years ago). Negative for hematuria and difficulty urinating.  Musculoskeletal: Positive for gait problem (2-3 times a week feels her right leg give out on her).  Skin: Negative for rash.  Neurological: Negative for dizziness and light-headedness.   Psychiatric/Behavioral: Negative for suicidal ideas.       Objective:   Physical Exam  Constitutional: She is oriented to person, place, and time. She appears well-developed and well-nourished.  HENT:  Head: Normocephalic and atraumatic.  Eyes: Conjunctivae and EOM are normal. Pupils are equal, round, and reactive to light. Right eye exhibits no discharge. Left eye exhibits no discharge. No scleral icterus.  Neck: Normal range of motion. No thyromegaly present.  Cardiovascular: Normal rate, regular rhythm, normal heart sounds and intact distal pulses.  Exam reveals no gallop and no friction rub.   No murmur heard. Pulmonary/Chest: Effort normal and breath sounds normal.  Abdominal: Soft. Bowel sounds are normal. She exhibits no distension and no mass. There is no tenderness. There is no rebound.  Musculoskeletal: Normal range of motion.  Neurological: She is alert and oriented to person, place, and time. She has normal reflexes. No cranial nerve deficit.  Skin: Skin is warm and dry. No rash noted.  Psychiatric: Her behavior is normal. Thought content normal.   BP 108/75  Pulse 71  Temp(Src) 98.1 F (36.7 C) (Oral)  Wt 111 lb (50.349 kg)      Assessment & Plan:  See Problem List Documentation

## 2013-06-12 ENCOUNTER — Encounter: Payer: Self-pay | Admitting: Family Medicine

## 2013-06-12 ENCOUNTER — Other Ambulatory Visit (HOSPITAL_COMMUNITY)
Admission: RE | Admit: 2013-06-12 | Discharge: 2013-06-12 | Disposition: A | Discharge status: Home / Self Care | Payer: 59 | Source: Ambulatory Visit | Attending: Family Medicine | Admitting: Family Medicine

## 2013-06-12 ENCOUNTER — Ambulatory Visit (INDEPENDENT_AMBULATORY_CARE_PROVIDER_SITE_OTHER): Payer: 59 | Admitting: Family Medicine

## 2013-06-12 VITALS — BP 117/77 | HR 76 | Temp 97.8°F | Wt 130.0 lb

## 2013-06-12 DIAGNOSIS — N760 Acute vaginitis: Secondary | ICD-10-CM

## 2013-06-12 DIAGNOSIS — B9689 Other specified bacterial agents as the cause of diseases classified elsewhere: Secondary | ICD-10-CM

## 2013-06-12 DIAGNOSIS — N730 Acute parametritis and pelvic cellulitis: Secondary | ICD-10-CM

## 2013-06-12 DIAGNOSIS — Z113 Encounter for screening for infections with a predominantly sexual mode of transmission: Secondary | ICD-10-CM | POA: Insufficient documentation

## 2013-06-12 DIAGNOSIS — Z01419 Encounter for gynecological examination (general) (routine) without abnormal findings: Secondary | ICD-10-CM | POA: Insufficient documentation

## 2013-06-12 DIAGNOSIS — Z124 Encounter for screening for malignant neoplasm of cervix: Secondary | ICD-10-CM

## 2013-06-12 DIAGNOSIS — N898 Other specified noninflammatory disorders of vagina: Secondary | ICD-10-CM

## 2013-06-12 DIAGNOSIS — Z1151 Encounter for screening for human papillomavirus (HPV): Secondary | ICD-10-CM | POA: Insufficient documentation

## 2013-06-12 DIAGNOSIS — A499 Bacterial infection, unspecified: Secondary | ICD-10-CM

## 2013-06-12 LAB — POCT WET PREP (WET MOUNT): Clue Cells Wet Prep Whiff POC: POSITIVE

## 2013-06-12 LAB — HIV ANTIBODY (ROUTINE TESTING W REFLEX): HIV: NONREACTIVE

## 2013-06-12 LAB — RPR

## 2013-06-12 MED ORDER — METRONIDAZOLE 500 MG PO TABS: 500.0000 mg | ORAL_TABLET | Freq: Two times a day (BID) | ORAL | Status: DC

## 2013-06-12 MED ORDER — CEFTRIAXONE SODIUM 250 MG IJ SOLR
250.0000 mg | Freq: Once | INTRAMUSCULAR | Status: AC
  Administered 2013-06-12: 250 mg via INTRAMUSCULAR

## 2013-06-12 MED ORDER — CEFTRIAXONE SODIUM 250 MG IJ SOLR: 250.0000 mg | Freq: Once | INTRAMUSCULAR | Status: DC

## 2013-06-12 MED ORDER — FLUCONAZOLE 150 MG PO TABS: 150.0000 mg | ORAL_TABLET | Freq: Once | ORAL | Status: DC

## 2013-06-12 MED ORDER — DOXYCYCLINE HYCLATE 100 MG PO TABS: 100.0000 mg | ORAL_TABLET | Freq: Two times a day (BID) | ORAL | Status: DC

## 2013-06-12 NOTE — Progress Notes (Signed)
   Subjective:    Patient ID: Dana Knight, female    DOB: 25-Feb-1976, 37 y.o.   MRN: [id number]  HPI  # Painful intercourse - has been occuring since she had IUD put in 2012, but thinks it is getting worse - painful for her and her partner (partner not complaining of irritation from strings) - has bleeding/spotting every other week since IUD put in, last bleeding stopped last Wednesday - has noticed continuous brownish/yellowish discharge over past few weeks - lower abdominal cramping pain that occurs every other week, not always at the same time as bleeding, since it was put in   # Weight gain - expected weight gain +19lbs over past 2 months - she took the remeron for 1 month but did not get any more refills. - no major changes in diet - she is happy with her current weight, does not want to take more remeron  Review of Systems No CP, SOB. No diarrhea/constipation. Positive for vaginal discharge as mentioned above. No fevers/chills.     Objective:   Physical Exam BP 117/77  Pulse 76  Temp(Src) 97.8 F (36.6 C) (Oral)  Wt 130 lb (58.968 kg)  LMP 06/05/2013  General: NAD HEENT: PERRL, EOMI CV: RRR, normal heart sounds Resp: CTAB, effort normal GI: bowel sounds present, soft, non tender to palpation. GU: SSE: NAVM, yellow/green mucosal discharge around cervix. IUD strings visible with some crusting, device not seen extruding from cervical canal. Bimanual: cervical motion tenderness present, normal sized ovaries with tenderness bilaterally     Assessment & Plan:  See Problem List documentation

## 2013-06-12 NOTE — Assessment & Plan Note (Signed)
Based on exam will treat for PID before receiving test results. Ceftriaxone 250mg  IM once, doxycycline 100mg  BID x 14 days. Adding flagyl 500mg  BID x 14 days as well given wet prep showing BV. Diflucan also ordered to be taken after completing antibiotics course.  Follow up in 1 week or sooner if symptoms worsen. Discussed possibility of removing IUD based on test results and/or no improvement in symptoms. She expressed interest in switching to nexplanon.

## 2013-06-12 NOTE — Patient Instructions (Addendum)
If the test results come back positive, I will call you.  If you experience severe/increased pain, fevers or chills, please call the clinic or go to the ED.  Please schedule a follow up in 1 week.

## 2013-06-14 ENCOUNTER — Encounter: Payer: Self-pay | Admitting: Family Medicine

## 2013-06-29 ENCOUNTER — Telehealth: Payer: Self-pay | Admitting: Family Medicine

## 2013-06-29 DIAGNOSIS — G4700 Insomnia, unspecified: Secondary | ICD-10-CM

## 2013-06-29 MED ORDER — MIRTAZAPINE 7.5 MG PO TABS: 7.5000 mg | ORAL_TABLET | Freq: Every day | ORAL | Status: DC

## 2013-06-29 NOTE — Telephone Encounter (Signed)
[facility] Thanks. Greig Castilla-Cullin Dishman

## 2013-06-29 NOTE — Telephone Encounter (Signed)
Pt is aware of this. Jazmin Hartsell,CMA  

## 2013-06-29 NOTE — Telephone Encounter (Signed)
Pt called because she told Dr. Waynetta SandyWight that she didn't want to take Mitazapine anymore. But now she realized that she does want to take it. She would like Dr. Waynetta SandyWight to call this for her. She would like it to go to a different pharmacy. [facility] [address] jw

## 2013-07-07 ENCOUNTER — Encounter: Payer: Self-pay | Admitting: Family Medicine

## 2013-09-21 ENCOUNTER — Encounter (HOSPITAL_BASED_OUTPATIENT_CLINIC_OR_DEPARTMENT_OTHER): Payer: Self-pay | Admitting: Emergency Medicine

## 2013-09-21 ENCOUNTER — Emergency Department (HOSPITAL_BASED_OUTPATIENT_CLINIC_OR_DEPARTMENT_OTHER)
Admission: EM | Admit: 2013-09-21 | Discharge: 2013-09-21 | Disposition: A | Discharge status: Home / Self Care | Payer: Managed Care, Other (non HMO) | Attending: Emergency Medicine | Admitting: Emergency Medicine

## 2013-09-21 DIAGNOSIS — M549 Dorsalgia, unspecified: Secondary | ICD-10-CM | POA: Insufficient documentation

## 2013-09-21 DIAGNOSIS — R51 Headache: Secondary | ICD-10-CM | POA: Insufficient documentation

## 2013-09-21 DIAGNOSIS — M62838 Other muscle spasm: Secondary | ICD-10-CM

## 2013-09-21 DIAGNOSIS — Z8719 Personal history of other diseases of the digestive system: Secondary | ICD-10-CM | POA: Insufficient documentation

## 2013-09-21 DIAGNOSIS — Z79899 Other long term (current) drug therapy: Secondary | ICD-10-CM | POA: Insufficient documentation

## 2013-09-21 DIAGNOSIS — F411 Generalized anxiety disorder: Secondary | ICD-10-CM | POA: Insufficient documentation

## 2013-09-21 DIAGNOSIS — Z8619 Personal history of other infectious and parasitic diseases: Secondary | ICD-10-CM | POA: Insufficient documentation

## 2013-09-21 MED ORDER — HYDROCODONE-ACETAMINOPHEN 5-325 MG PO TABS: 1.0000 | ORAL_TABLET | ORAL | Status: DC | PRN

## 2013-09-21 MED ORDER — CYCLOBENZAPRINE HCL 10 MG PO TABS: 10.0000 mg | ORAL_TABLET | Freq: Three times a day (TID) | ORAL | Status: DC | PRN

## 2013-09-21 MED ORDER — IBUPROFEN 800 MG PO TABS: 800.0000 mg | ORAL_TABLET | Freq: Three times a day (TID) | ORAL | Status: DC

## 2013-09-21 NOTE — ED Notes (Signed)
Neck pain and stiffness x 2 days unrelieved after taking OTC Ibuprofen and heat.  Denies injury.

## 2013-09-21 NOTE — ED Provider Notes (Signed)
CSN: [id number]     Arrival date & time 09/21/13  1003 History   First MD Initiated Contact with Patient 09/21/13 1034     Chief Complaint  Patient presents with  . Neck Pain     (Consider location/radiation/quality/duration/timing/severity/associated sxs/prior Treatment) HPI 38 year old female presents with 3 days of worsening upper back/neck pain. She points to the superior middle back in between her shoulder blades. This radiates up her neck is causing headache. She has more pain with range of motion of her neck. No fevers, chills, vomiting, or blurred vision. No weakness or numbness. She's not remember any specific injury. She states that she's tried ibuprofen 800 mg once without relief.  Past Medical History  Diagnosis Date  . No pertinent past medical history   . History of chlamydia   . HSV (herpes simplex virus) anogenital infection   . Anxiety   . Abnormal Pap smear    Past Surgical History  Procedure Laterality Date  . No past surgeries    . Colposcopy  2010  . Dilation and curettage of uterus     Family History  Problem Relation Age of Onset  . Stroke Mother   . Diabetes Father   . Thyroid disease Maternal Aunt   . Diabetes Maternal Grandmother    History  Substance Use Topics  . Smoking status: Never Smoker   . Smokeless tobacco: Not on file  . Alcohol Use: Yes     Comment: occasional   OB History   Grav Para Term Preterm Abortions TAB SAB Ect Mult Living   5 3 3  0 2 0 0 0 0 3     Review of Systems  Constitutional: Negative for fever.  Eyes: Negative for photophobia.  Gastrointestinal: Negative for vomiting.  Musculoskeletal: Positive for back pain and neck pain.  Neurological: Positive for headaches. Negative for weakness and numbness.  All other systems reviewed and are negative.     Allergies  Review of patient's allergies indicates no known allergies.  Home Medications   Current Outpatient Rx  Name  Route  Sig  Dispense  Refill  .  cyclobenzaprine (FLEXERIL) 10 MG tablet   Oral   Take 1 tablet (10 mg total) by mouth 3 (three) times daily as needed for muscle spasms.   21 tablet   0   . diphenhydrAMINE (BENADRYL) 25 MG tablet   Oral   Take 25 mg by mouth at bedtime as needed for allergies.         Marland Kitchen. doxycycline (VIBRA-TABS) 100 MG tablet   Oral   Take 1 tablet (100 mg total) by mouth 2 (two) times daily.   20 tablet   0   . fluconazole (DIFLUCAN) 150 MG tablet   Oral   Take 1 tablet (150 mg total) by mouth once. Take after completing antibiotics course   1 tablet   0   . HYDROcodone-acetaminophen (NORCO/VICODIN) 5-325 MG per tablet   Oral   Take 1 tablet by mouth every 4 (four) hours as needed.   20 tablet   0   . ibuprofen (ADVIL,MOTRIN) 200 MG tablet   Oral   Take 400 mg by mouth every 8 (eight) hours as needed for pain.         Marland Kitchen. ibuprofen (ADVIL,MOTRIN) 800 MG tablet   Oral   Take 1 tablet (800 mg total) by mouth 3 (three) times daily.   21 tablet   0   . levonorgestrel (MIRENA) 20 MCG/24HR IUD  Intrauterine   1 each by Intrauterine route once.         . metroNIDAZOLE (FLAGYL) 500 MG tablet   Oral   Take 1 tablet (500 mg total) by mouth 2 (two) times daily.   28 tablet   0   . mirtazapine (REMERON) 7.5 MG tablet   Oral   Take 1 tablet (7.5 mg total) by mouth at bedtime.   30 tablet   5   . Multiple Vitamin (MULTIVITAMIN WITH MINERALS) TABS tablet   Oral   Take 1 tablet by mouth daily.          BP 125/85  Pulse 64  Temp(Src) 98.1 F (36.7 C) (Oral)  Resp 16  Ht 5\' 1"  (1.549 m)  Wt 138 lb (62.596 kg)  BMI 26.09 kg/m2  SpO2 100% Physical Exam  Vitals reviewed. Constitutional: She is oriented to person, place, and time. She appears well-developed and well-nourished.  HENT:  Head: Normocephalic and atraumatic.  Right Ear: External ear normal.  Left Ear: External ear normal.  Nose: Nose normal.  Eyes: Right eye exhibits no discharge. Left eye exhibits no  discharge.  Neck: Muscular tenderness present. No spinous process tenderness present. No rigidity. No edema and no erythema present.  Cardiovascular: Normal rate, regular rhythm and normal heart sounds.   Pulmonary/Chest: Effort normal and breath sounds normal.  Abdominal: Soft. There is no tenderness.  Musculoskeletal:       Back:  Neurological: She is alert and oriented to person, place, and time.  Skin: Skin is warm and dry.    ED Course  Procedures (including critical care time) Labs Review Labs Reviewed - No data to display Imaging Review No results found.   EKG Interpretation None      MDM   Final diagnoses:  Trapezius muscle spasm    Patient's symptoms are localized to mid trapezius. Her pain worsened with range of motion of neck there is no stiffness. I am able to passively range her neck. No neurologic compromise. No fevers or vomiting. I have a very low suspicion for acute infection or neurologic compromise. We'll treat her pain and have her followup with her PCP.    Audree Camel, MD 09/21/13 [phone]

## 2013-12-11 ENCOUNTER — Other Ambulatory Visit: Payer: Self-pay | Admitting: Family Medicine

## 2014-03-14 ENCOUNTER — Other Ambulatory Visit: Payer: Self-pay | Admitting: Family Medicine

## 2014-04-16 ENCOUNTER — Encounter (HOSPITAL_BASED_OUTPATIENT_CLINIC_OR_DEPARTMENT_OTHER): Payer: Self-pay | Admitting: Emergency Medicine

## 2014-04-19 ENCOUNTER — Other Ambulatory Visit: Payer: Self-pay | Admitting: Family Medicine

## 2014-04-19 DIAGNOSIS — G4700 Insomnia, unspecified: Secondary | ICD-10-CM

## 2014-05-29 ENCOUNTER — Encounter: Payer: Self-pay | Admitting: Family Medicine

## 2014-05-29 ENCOUNTER — Ambulatory Visit (INDEPENDENT_AMBULATORY_CARE_PROVIDER_SITE_OTHER): Payer: Managed Care, Other (non HMO) | Admitting: Family Medicine

## 2014-05-29 ENCOUNTER — Other Ambulatory Visit (HOSPITAL_COMMUNITY)
Admission: RE | Admit: 2014-05-29 | Discharge: 2014-05-29 | Disposition: A | Discharge status: Home / Self Care | Payer: Managed Care, Other (non HMO) | Source: Ambulatory Visit | Attending: Family Medicine | Admitting: Family Medicine

## 2014-05-29 VITALS — BP 109/73 | HR 67 | Temp 97.6°F | Ht 61.0 in | Wt 135.0 lb

## 2014-05-29 DIAGNOSIS — Z113 Encounter for screening for infections with a predominantly sexual mode of transmission: Secondary | ICD-10-CM

## 2014-05-29 LAB — RPR

## 2014-05-29 NOTE — Progress Notes (Signed)
  Subjective:    Dana Knight is a 38 y.o. female who presents for sexually transmitted disease check. Sexual history reviewed with the patient. STI Exposure: denies knowledge of risky exposure and concern due to condom falling off during sexual intercourse with partner of > 2 yrs. Previous history of STI none. Current symptoms none. Contraception: IUD Menstrual History: OB History    Gravida Para Term Preterm AB TAB SAB Ectopic Multiple Living   5 3 3  0 2 0 0 0 0 3      The following portions of the patient's history were reviewed and updated as appropriate: allergies, current medications, past family history, past medical history, past social history, past surgical history and problem list.  Review of Systems Pertinent items are noted in HPI.    Objective:    BP 109/73 mmHg  Pulse 67  Temp(Src) 97.6 F (36.4 C) (Oral)  Ht 5\' 1"  (1.549 m)  Wt 135 lb (61.236 kg)  BMI 25.52 kg/m2 General:   alert, cooperative and appears stated age  Lymph Nodes:   no LAD  Pelvis:  Vulva and vagina appear normal. Bimanual exam reveals normal uterus and adnexa.  Performed with Garen GramsAsha Benton, CNA  Cultures:  GC and Chlamydia genprobes, HIV antibody blood test and RPR     Assessment:    Very low risk of STD exposure    Plan:    Discussed safe sexual practice in detail Appropriate educational material was distributed See orders for STD cultures and assays

## 2014-05-29 NOTE — Patient Instructions (Signed)

## 2014-05-30 LAB — CERVICOVAGINAL ANCILLARY ONLY
Chlamydia: NEGATIVE
Neisseria Gonorrhea: NEGATIVE

## 2014-05-30 LAB — HIV ANTIBODY (ROUTINE TESTING W REFLEX): HIV: NONREACTIVE

## 2014-05-31 ENCOUNTER — Telehealth: Payer: Self-pay | Admitting: Family Medicine

## 2014-05-31 NOTE — Telephone Encounter (Signed)
Discussed results with pt on the phone, all STD testing negative.  No further questions.   Thanks Tesoro CorporationBryan R. Paulina FusiHess, DO [facility] 05/31/2014, 9:50 AM

## 2014-08-13 ENCOUNTER — Other Ambulatory Visit: Payer: Self-pay | Admitting: Family Medicine

## 2014-08-28 ENCOUNTER — Encounter: Payer: Self-pay | Admitting: Family Medicine

## 2014-08-28 ENCOUNTER — Ambulatory Visit (INDEPENDENT_AMBULATORY_CARE_PROVIDER_SITE_OTHER): Payer: Managed Care, Other (non HMO) | Admitting: Family Medicine

## 2014-08-28 VITALS — BP 112/76 | HR 69 | Temp 98.4°F | Ht 61.0 in | Wt 136.6 lb

## 2014-08-28 DIAGNOSIS — F32A Depression, unspecified: Secondary | ICD-10-CM

## 2014-08-28 DIAGNOSIS — G4700 Insomnia, unspecified: Secondary | ICD-10-CM

## 2014-08-28 DIAGNOSIS — F329 Major depressive disorder, single episode, unspecified: Secondary | ICD-10-CM

## 2014-08-28 MED ORDER — TRAZODONE HCL 50 MG PO TABS: 25.0000 mg | ORAL_TABLET | Freq: Every evening | ORAL | Status: DC | PRN

## 2014-08-28 MED ORDER — SERTRALINE HCL 50 MG PO TABS: 50.0000 mg | ORAL_TABLET | Freq: Every day | ORAL | Status: DC

## 2014-08-28 NOTE — Progress Notes (Signed)
   Subjective:    Patient ID: Karlyn AgeeLatoya D Vanderzee, female    DOB: 25-Jan-1976, 39 y.o.   MRN: [id number]  HPI  CC: follow up  # Depression:  PHQ9 8/not difficult (1 for all answers)  Ran out of remeron at least >1 month ago  She reports talking regularly with support person from her church (also a Radio broadcast assistantcoworker)  She denies any specific problems in her life, is not having money issues, but states she just doesn't enjoy things like going to son/daughter activities  Denies any SI or HI.  She does answer "yes" to "do you feel depressed?" ROS: no HA, no weight loss, +insomnia, +abdominal pain this morning  Review of Systems   See HPI for ROS. All other systems reviewed and are negative.  Past medical history, surgical, family, and social history reviewed and updated in the EMR as appropriate. Objective:  BP 112/76 mmHg  Pulse 69  Temp(Src) 98.4 F (36.9 C) (Oral)  Ht 5\' 1"  (1.549 m)  Wt 136 lb 9.6 oz (61.961 kg)  BMI 25.82 kg/m2 Vitals and nursing note reviewed  General: NAD CV: RRR, normal s1s2 no mrg Resp: CTAB normal effort Psych: normal speech, normal thought content  Assessment & Plan:  See Problem List Documentation

## 2014-08-29 DIAGNOSIS — F32A Depression, unspecified: Secondary | ICD-10-CM | POA: Insufficient documentation

## 2014-08-29 DIAGNOSIS — F329 Major depressive disorder, single episode, unspecified: Secondary | ICD-10-CM | POA: Insufficient documentation

## 2014-08-29 NOTE — Assessment & Plan Note (Signed)
Trial of trazodone while off remeron.

## 2014-08-29 NOTE — Assessment & Plan Note (Signed)
PHQ9 score 8. Endorses depression feelings. Already has support person for counseling. No SI/HI. Pt reported some improvement with remeron but has been off of this. Discussed with her and feel she may do better with another SSRI agent. Will start zoloft 50mg  daily. F/u 6 weeks.

## 2014-08-31 ENCOUNTER — Telehealth: Payer: Self-pay | Admitting: Family Medicine

## 2014-08-31 NOTE — Telephone Encounter (Signed)
Will forward to MD. Jazmin Hartsell,CMA  

## 2014-08-31 NOTE — Telephone Encounter (Signed)
pts says the new medication she is on is making her feel weird, she wants to continue the mirtazapine

## 2014-09-03 MED ORDER — MIRTAZAPINE 15 MG PO TABS: ORAL_TABLET | ORAL | Status: DC

## 2014-09-03 NOTE — Telephone Encounter (Signed)
Okay to discontinue zoloft and trazodone. Rx sent in for mirtazapine 15mg  tablets, take 1/2 tablet at night. We may increase this to 1 tablet at night at her follow up visit. Please call and let her know. -Dr. Waynetta SandyWight

## 2014-09-03 NOTE — Telephone Encounter (Signed)
Pt is aware and follow up appt made for 01/09/15. Jazmin Hartsell,CMA

## 2014-10-03 ENCOUNTER — Ambulatory Visit (INDEPENDENT_AMBULATORY_CARE_PROVIDER_SITE_OTHER): Payer: Managed Care, Other (non HMO) | Admitting: Family Medicine

## 2014-10-03 ENCOUNTER — Other Ambulatory Visit (HOSPITAL_COMMUNITY)
Admission: RE | Admit: 2014-10-03 | Discharge: 2014-10-03 | Disposition: A | Discharge status: Home / Self Care | Payer: Managed Care, Other (non HMO) | Source: Ambulatory Visit | Attending: Family Medicine | Admitting: Family Medicine

## 2014-10-03 VITALS — BP 115/80 | HR 80 | Temp 98.3°F | Wt 143.9 lb

## 2014-10-03 DIAGNOSIS — Z202 Contact with and (suspected) exposure to infections with a predominantly sexual mode of transmission: Secondary | ICD-10-CM

## 2014-10-03 DIAGNOSIS — R1030 Lower abdominal pain, unspecified: Secondary | ICD-10-CM | POA: Insufficient documentation

## 2014-10-03 DIAGNOSIS — Z113 Encounter for screening for infections with a predominantly sexual mode of transmission: Secondary | ICD-10-CM | POA: Insufficient documentation

## 2014-10-03 DIAGNOSIS — R1032 Left lower quadrant pain: Secondary | ICD-10-CM

## 2014-10-03 DIAGNOSIS — Z20828 Contact with and (suspected) exposure to other viral communicable diseases: Secondary | ICD-10-CM

## 2014-10-03 DIAGNOSIS — N730 Acute parametritis and pelvic cellulitis: Secondary | ICD-10-CM

## 2014-10-03 HISTORY — DX: Lower abdominal pain, unspecified: R10.30

## 2014-10-03 LAB — POCT URINALYSIS DIPSTICK
Bilirubin, UA: NEGATIVE
Glucose, UA: NEGATIVE
KETONES UA: NEGATIVE
Leukocytes, UA: NEGATIVE
Nitrite, UA: NEGATIVE
RBC UA: NEGATIVE
SPEC GRAV UA: 1.02
Urobilinogen, UA: 0.2
pH, UA: 7.5

## 2014-10-03 LAB — POCT WET PREP (WET MOUNT): CLUE CELLS WET PREP WHIFF POC: NEGATIVE

## 2014-10-03 LAB — POCT URINE PREGNANCY: PREG TEST UR: NEGATIVE

## 2014-10-03 MED ORDER — CEFTRIAXONE SODIUM 1 G IJ SOLR
250.0000 mg | INTRAMUSCULAR | Status: DC
  Administered 2014-10-03: 250 mg via INTRAMUSCULAR

## 2014-10-03 MED ORDER — AZITHROMYCIN 500 MG PO TABS
1000.0000 mg | ORAL_TABLET | Freq: Once | ORAL | Status: AC
  Administered 2014-10-03: 1000 mg via ORAL

## 2014-10-03 NOTE — Progress Notes (Signed)
Patient ID: Dana Knight, female   DOB: [birth year], 39 y.o.   MRN: [id number] Subjective:   CC: Lower abdominal cramping, concern for STD  HPI:   Patient reports that she has had unprotected sex with her son's father, who may be having sex with others. She states the most recent intercourse was about one week ago. She has been having crampy lower abdominal pain for about 3 days, and was concerned so took some leftover amoxicillin yesterday that seemes to ease the pain. She also had discharge one week ago with vaginal irritation that resolved 4 days ago, but now still has some mild slightly yellow not malodorous vaginal discharge but no vaginal irritation. Associated symptoms include tender breasts, and lower abdominal pressure, with increased urinary frequency one week ago that has resolved. She denies fevers, chills, vaginal bleeding, nausea, vomiting, change in oral intake, back pain, or dysuria. Of note, she has had a history of PID and worries that this might be the same thing.  Has Mirena in place; STD testing December 2015 was negative.   Review of Systems - Per HPI.   PMH - history of abnormal Pap smear, unintentional weight loss, insomnia, history of PID, depression    Objective:  Physical Exam BP 115/80 mmHg  Pulse 80  Temp(Src) 98.3 F (36.8 C) (Oral)  Wt 143 lb 14.4 oz (65.273 kg) GEN: NAD Abdomen: Soft, mild tenderness suprapubically, nondistended, no organomegaly Back: No CVA tenderness GU: External genitalia and vagina normal in appearance, cervix with visible Mirena strings, thick yellowish discharge, mild to moderate cervical motion tenderness, normal size and mobility of uterus and adnexa     Assessment:     Dana AgeeLatoya D Steppe is a 39 y.o. female here for abdominal cramping and concern for STD.    Plan:     # See problem list and after visit summary for problem-specific plans.  # Health Maintenance: STD testing today per above  Follow-up: Follow up in 2 weeks as  needed if symptoms do not improve or immediately if fevers or other concerns.      Dana SingletonMaria T Tully Burgo, MD [facility]

## 2014-10-03 NOTE — Assessment & Plan Note (Deleted)
Pelvic discomfort, pressure, and increased urinary frequency about one week ago suggestive of UTI, though the symptoms have improved and now patient just has pelvic pain. Mild discharge is nonspecific. Risky sexual behaviors raise further concern for STD. Mild-moderate cervical motion tenderness on exam concerning for PID. VSS. -Empirically treat in clinic with ceftriaxone 250 mg IM and azithromycin 1 g by mouth -UA and UPT -Wet prep, GC /chlamydia, HIV, RPR, she would like to be called with results -Discussed importance of condoms for protection against STDs; avoid sex pending results of testing. 

## 2014-10-03 NOTE — Patient Instructions (Signed)
We did some testing today for STDs. I will call you if anything is not normal. Because symptoms are severe on exam, we will go ahead and treat you with azithromycin and ceftriaxone. Await results of testing prior to engaging in sexual intercourse. You should always use protection like condoms to avoid contracting STDs. Follow-up in 2 weeks if symptoms have not improved or immediately if you have fevers, severe pain, or other concerns.

## 2014-10-03 NOTE — Assessment & Plan Note (Signed)
Pelvic discomfort, pressure, and increased urinary frequency about one week ago suggestive of UTI, though the symptoms have improved and now patient just has pelvic pain. Mild discharge is nonspecific. Risky sexual behaviors raise further concern for STD. Mild-moderate cervical motion tenderness on exam concerning for PID. VSS. -Empirically treat in clinic with ceftriaxone 250 mg IM and azithromycin 1 g by mouth -UA and UPT -Wet prep, GC /chlamydia, HIV, RPR, she would like to be called with results -Discussed importance of condoms for protection against STDs; avoid sex pending results of testing.

## 2014-10-04 ENCOUNTER — Telehealth: Payer: Self-pay | Admitting: Family Medicine

## 2014-10-04 ENCOUNTER — Encounter: Payer: Self-pay | Admitting: Family Medicine

## 2014-10-04 LAB — HIV ANTIBODY (ROUTINE TESTING W REFLEX): HIV 1&2 Ab, 4th Generation: NONREACTIVE

## 2014-10-04 LAB — GC/CHLAMYDIA PROBE AMP (~~LOC~~) NOT AT ARMC
CHLAMYDIA, DNA PROBE: NEGATIVE
Neisseria Gonorrhea: NEGATIVE

## 2014-10-04 LAB — RPR

## 2014-10-04 MED ORDER — FLUCONAZOLE 150 MG PO TABS: 150.0000 mg | ORAL_TABLET | Freq: Once | ORAL | Status: DC

## 2014-10-04 NOTE — Telephone Encounter (Signed)
Called patient to discuss that labwork so far (HIV, RPR, UA, Upreg) is all normal except wet prep showed yeast and I will send in diflucan for this. She voiced understanding and knows I will send letter if GC/Chlamydia normal and call if abnormal.  Dana SingletonMaria T Jahki Witham, MD

## 2014-10-10 ENCOUNTER — Ambulatory Visit: Payer: Managed Care, Other (non HMO) | Admitting: Family Medicine

## 2015-01-29 ENCOUNTER — Other Ambulatory Visit (HOSPITAL_COMMUNITY)
Admission: RE | Admit: 2015-01-29 | Discharge: 2015-01-29 | Disposition: A | Discharge status: Home / Self Care | Payer: Managed Care, Other (non HMO) | Source: Ambulatory Visit | Attending: Family Medicine | Admitting: Family Medicine

## 2015-01-29 ENCOUNTER — Ambulatory Visit (INDEPENDENT_AMBULATORY_CARE_PROVIDER_SITE_OTHER): Payer: Managed Care, Other (non HMO) | Admitting: Family Medicine

## 2015-01-29 ENCOUNTER — Other Ambulatory Visit: Payer: Self-pay | Admitting: Family Medicine

## 2015-01-29 ENCOUNTER — Encounter: Payer: Self-pay | Admitting: Family Medicine

## 2015-01-29 VITALS — BP 114/48 | HR 76 | Temp 98.2°F | Wt 146.0 lb

## 2015-01-29 DIAGNOSIS — B3731 Acute candidiasis of vulva and vagina: Secondary | ICD-10-CM

## 2015-01-29 DIAGNOSIS — A499 Bacterial infection, unspecified: Secondary | ICD-10-CM | POA: Diagnosis not present

## 2015-01-29 DIAGNOSIS — Z113 Encounter for screening for infections with a predominantly sexual mode of transmission: Secondary | ICD-10-CM | POA: Diagnosis not present

## 2015-01-29 DIAGNOSIS — N760 Acute vaginitis: Secondary | ICD-10-CM | POA: Diagnosis not present

## 2015-01-29 DIAGNOSIS — B373 Candidiasis of vulva and vagina: Secondary | ICD-10-CM

## 2015-01-29 DIAGNOSIS — B9689 Other specified bacterial agents as the cause of diseases classified elsewhere: Secondary | ICD-10-CM

## 2015-01-29 LAB — POCT WET PREP (WET MOUNT): Clue Cells Wet Prep Whiff POC: POSITIVE

## 2015-01-29 MED ORDER — METRONIDAZOLE 500 MG PO TABS: 500.0000 mg | ORAL_TABLET | Freq: Two times a day (BID) | ORAL | Status: DC

## 2015-01-29 MED ORDER — FLUCONAZOLE 150 MG PO TABS: 150.0000 mg | ORAL_TABLET | Freq: Once | ORAL | Status: DC

## 2015-01-29 NOTE — Assessment & Plan Note (Signed)
No high risk behaviors, but unprotected, same partner - Last tested neg all STDs HIV/RPR, GC / Chlam 10/03/14, treated empirically with Doxy  Plan: 1. Check GC / Chlam swab per request

## 2015-01-29 NOTE — Progress Notes (Addendum)
Subjective:    Patient ID: Dana Knight, female    DOB: [birth year], 39 y.o.   MRN: [id number]  HPI: Dana Knight is a 39 y.o. female presenting on 01/29/2015 for Vaginal Discharge   HPI  VAGINITIS - Reports symptoms started after LMP started Thurs 8/11 and ended by Sat 8/13 with small amount of spotting, has IUD, this is typical regular cycle. Started to feel "vaginal itching and irritation" with some slight odor, unclear if increased vaginal discharge - Sexual history with 1 female partner, no condoms - Diagnosed with yeast vaginitis on 10/03/14 on wet prep, however pending final test results was treated with Metronidazole, Diflucan and Doxycycline (preventatively) and she finished all antibiotics. Symptoms resolved. - Last tested for STD screening with HIV / RPR, Gonorrhea Chlamydia 10/03/2014 all negative / non-reactive  Past Medical History  Diagnosis Date  . No pertinent past medical history   . History of chlamydia   . HSV (herpes simplex virus) anogenital infection   . Anxiety   . Abnormal Pap smear     Current Outpatient Prescriptions on File Prior to Visit  Medication Sig  . cyclobenzaprine (FLEXERIL) 10 MG tablet Take 1 tablet (10 mg total) by mouth 3 (three) times daily as needed for muscle spasms.  . diphenhydrAMINE (BENADRYL) 25 MG tablet Take 25 mg by mouth at bedtime as needed for allergies.  Marland Kitchen HYDROcodone-acetaminophen (NORCO/VICODIN) 5-325 MG per tablet Take 1 tablet by mouth every 4 (four) hours as needed.  Marland Kitchen ibuprofen (ADVIL,MOTRIN) 200 MG tablet Take 400 mg by mouth every 8 (eight) hours as needed for pain.  Marland Kitchen ibuprofen (ADVIL,MOTRIN) 800 MG tablet Take 1 tablet (800 mg total) by mouth 3 (three) times daily.  Marland Kitchen levonorgestrel (MIRENA) 20 MCG/24HR IUD 1 each by Intrauterine route once.  . mirtazapine (REMERON) 15 MG tablet TAKE 1/2 TABLET BY MOUTH EVERY NIGHT AT BEDTIME.  . Multiple Vitamin (MULTIVITAMIN WITH MINERALS) TABS tablet Take 1 tablet by mouth daily.     No current facility-administered medications on file prior to visit.    Review of Systems  Constitutional: Negative for fever, chills, diaphoresis and fatigue.  Gastrointestinal: Negative for nausea, vomiting, abdominal pain, diarrhea and blood in stool.  Genitourinary: Positive for vaginal discharge. Negative for dysuria, urgency, frequency, hematuria, vaginal bleeding, difficulty urinating, genital sores, pelvic pain and dyspareunia.       Vaginal irritation, mild odor  Musculoskeletal: Negative for arthralgias.   Per HPI unless specifically indicated above     Objective:    BP 114/48 mmHg  Pulse 76  Temp(Src) 98.2 F (36.8 C) (Oral)  Wt 146 lb (66.225 kg)  Wt Readings from Last 3 Encounters:  01/29/15 146 lb (66.225 kg)  10/03/14 143 lb 14.4 oz (65.273 kg)  08/28/14 136 lb 9.6 oz (61.961 kg)    Physical Exam  Constitutional: She appears well-developed and well-nourished. No distress.  HENT:  Mouth/Throat: Oropharynx is clear and moist.  Abdominal: Soft. There is no tenderness.  Genitourinary: Uterus normal. Vaginal discharge found.  Normal external female genitalia. Vaginal canal without lesions. Normal appearing cervix w/o lesions or bleeding, with normal visualized IUD strings. Mostly normal physiologic discharge, increased slightly thicker cervical mucus on exam. Bimanual exam without masses or cervical motion tenderness.   Skin: Skin is warm and dry. She is not diaphoretic.   Pelvic exam chaperoned by Garen Grams, LPN  Results for orders placed or performed in visit on 01/29/15  POCT Wet Prep [facility]  Result Value Ref  Range   Source Wet Prep POC VAG    WBC, Wet Prep HPF POC 10-15    Bacteria Wet Prep HPF POC Many (A) None, Few   Clue Cells Wet Prep HPF POC Many (A) None   Clue Cells Wet Prep Whiff POC Positive Whiff    Yeast Wet Prep HPF POC Few    Trichomonas Wet Prep HPF POC NONE       Assessment & Plan:   Problem List Items Addressed This Visit       Genitourinary   Vaginitis - Primary    Consistent with unspecified vaginitis for few days following LMP, also with unprotected sexual intercourse w/ 1 partner (not high risk for STDs, but likely vaginal environmental change from semen contact). - Last testing 10/03/14 with positive yeast vaginitis, otherwise HIV/RPR, GC/Chlam all negative, was treated with Flagyl, Diflucan, Doxy completed all  Plan: 1. Check Wet prep - positive for BV (many clue +whiff), and few yeast, sent Metronidazole 500mg  BID x 7 days complete course then start Diflucan 150mg  repeat 2nd dose on Day 3 2. Check GC/Chlamydia per request for STD screen 3. Counseled on reducing recurrences with condom use, may try yogurt, probiotics, alternatively some patients are prone to recurrences with certain partners.       Relevant Orders   Cervicovaginal ancillary only   POCT Wet Prep [facility]) (Completed)     Other   Screening for STD (sexually transmitted disease)    No high risk behaviors, but unprotected, same partner - Last tested neg all STDs HIV/RPR, GC / Chlam 10/03/14, treated empirically with Doxy  Plan: 1. Check GC / Chlam swab per request       Other Visit Diagnoses    Yeast vaginitis        Confirmed Yeast on wet prep. Sent Diflucan 2 pills.    Relevant Medications    fluconazole (DIFLUCAN) 150 MG tablet    metroNIDAZOLE (FLAGYL) 500 MG tablet    BV (bacterial vaginosis)        Consistent with BV on wet prep, many clue cells +whiff. Treat Metronidazole    Relevant Medications    fluconazole (DIFLUCAN) 150 MG tablet    metroNIDAZOLE (FLAGYL) 500 MG tablet       Meds ordered this encounter  Medications  . fluconazole (DIFLUCAN) 150 MG tablet    Sig: Take 1 tablet (150 mg total) by mouth once. Take 2nd pill on Day 3    Dispense:  2 tablet    Refill:  0  . metroNIDAZOLE (FLAGYL) 500 MG tablet    Sig: Take 1 tablet (500 mg total) by mouth 2 (two) times daily. Do not drink alcohol while taking this  medicine.    Dispense:  14 tablet    Refill:  0      Follow up plan: Return in about 2 weeks (around 02/12/2015), or if symptoms worsen or fail to improve.

## 2015-01-29 NOTE — Assessment & Plan Note (Addendum)
Consistent with unspecified vaginitis for few days following LMP, also with unprotected sexual intercourse w/ 1 partner (not high risk for STDs, but likely vaginal environmental change from semen contact). - Last testing 10/03/14 with positive yeast vaginitis, otherwise HIV/RPR, GC/Chlam all negative, was treated with Flagyl, Diflucan, Doxy completed all  Plan: 1. Check Wet prep - positive for BV (many clue +whiff), and few yeast, sent Metronidazole  BID x 7 days complete course then start Diflucan  repeat 2nd dose on Day 3 2. Check GC/Chlamydia per request for STD screen 3. Counseled on reducing recurrences with condom use, may try yogurt, probiotics, alternatively some patients are prone to recurrences with certain partners.

## 2015-01-29 NOTE — Addendum Note (Signed)
Addended by: Smitty Cords on: 01/29/2015 10:45 AM   Modules accepted: Orders

## 2015-01-29 NOTE — Patient Instructions (Signed)
Dear Dana Knight, Thank you for coming in to clinic today.  1. Your symptoms sound consistent with a Vaginitis - irritation from likely BV or yeast. 2. Tested wet prep today looking for BV, Yeast, and Trichomonas - will call you with results. - If needed will send in Metronidazole  twice daily for 7 days (for either BV or Trich) no alcohol on this med. - If positive for Yeast - will send Diflucan  pill, take 1 and then on Day 3 take 2nd pill. - If both medicines sent, finish Metronidazole FIRST then take Diflucan. 3. Will call with results for Gonorrhea / Chlamydia swab in 2-3 days.  If tests negative, maybe normal symptoms following period. Unprotected intercourse can cause irritation and no infection sometimes. Make sure to try using condoms to see if reduces your symptoms. Probiotics and yogurt can help reduce BV as well.  IUD strings look normal.  Please schedule a follow-up appointment with Dr. Waynetta Sandy as needed in 2-4 weeks if unresolved  If you have any other questions or concerns, please feel free to call the clinic to contact me. You may also schedule an earlier appointment if necessary.  However, if your symptoms get significantly worse, please go to the Emergency Department to seek immediate medical attention.  Saralyn Pilar, DO [facility]

## 2015-01-30 LAB — CERVICOVAGINAL ANCILLARY ONLY
Chlamydia: NEGATIVE
NEISSERIA GONORRHEA: NEGATIVE

## 2015-01-30 NOTE — Telephone Encounter (Signed)
Refill request from pharmacy. Will forward to PCP for review. Magaline Steinberg,Jeannie, CMA. 

## 2015-03-20 ENCOUNTER — Telehealth: Payer: Self-pay | Admitting: Family Medicine

## 2015-03-20 DIAGNOSIS — B3731 Acute candidiasis of vulva and vagina: Secondary | ICD-10-CM

## 2015-03-20 DIAGNOSIS — N760 Acute vaginitis: Secondary | ICD-10-CM

## 2015-03-20 DIAGNOSIS — B373 Candidiasis of vulva and vagina: Secondary | ICD-10-CM

## 2015-03-20 DIAGNOSIS — B9689 Other specified bacterial agents as the cause of diseases classified elsewhere: Secondary | ICD-10-CM

## 2015-03-20 NOTE — Telephone Encounter (Signed)
Will forward to MD.  Patient may need an appt to be re-evaluated. Jerney Baksh,CMA

## 2015-03-20 NOTE — Telephone Encounter (Signed)
Pt was here back in August for BV, was treated, says she just got off her cycle and has it again, wants to know if rx can be prescribed again? Tried making work in appt but there isn't anything available until Friday. Pt says if something can be called in she will also need diflucan because she always gets a yeast infection with abx. Pt goes to [facility]/ [address]/ [address]. Pt was seen by [employer]

## 2015-03-22 MED ORDER — FLUCONAZOLE 150 MG PO TABS: ORAL_TABLET | ORAL | Status: DC

## 2015-03-22 MED ORDER — METRONIDAZOLE 500 MG PO TABS: 500.0000 mg | ORAL_TABLET | Freq: Two times a day (BID) | ORAL | Status: DC

## 2015-03-22 NOTE — Telephone Encounter (Signed)
Rx sent in. If she is still having discharge after treatment she should come in for additional testing. Can you please call the patient and let her know? Thanks. -Dr. Waynetta Sandy

## 2015-03-26 NOTE — Telephone Encounter (Signed)
Patient is aware of this and is already feeling better from taking the medication.  She plans to come in to be seen if it happens again. Ion Gonnella,CMA

## 2015-05-07 ENCOUNTER — Telehealth: Payer: Self-pay | Admitting: *Deleted

## 2015-05-07 MED ORDER — MIRTAZAPINE 15 MG PO TABS: ORAL_TABLET | ORAL | Status: DC

## 2015-05-07 NOTE — Telephone Encounter (Signed)
Refill request from pt. Will forward to PCP for review. Dorethia Jeanmarie,Vadie, CMA. 

## 2015-05-07 NOTE — Telephone Encounter (Signed)
Patient requesting refill on mirtazepine

## 2015-06-21 ENCOUNTER — Ambulatory Visit (INDEPENDENT_AMBULATORY_CARE_PROVIDER_SITE_OTHER): Payer: Managed Care, Other (non HMO) | Admitting: Family Medicine

## 2015-06-21 ENCOUNTER — Encounter: Payer: Self-pay | Admitting: Family Medicine

## 2015-06-21 VITALS — BP 107/68 | HR 69 | Temp 98.0°F | Ht 65.0 in | Wt 137.9 lb

## 2015-06-21 DIAGNOSIS — N898 Other specified noninflammatory disorders of vagina: Secondary | ICD-10-CM

## 2015-06-21 LAB — POCT WET PREP (WET MOUNT): Clue Cells Wet Prep Whiff POC: NEGATIVE

## 2015-06-21 MED ORDER — MICONAZOLE NITRATE 200 MG VA SUPP: 200.0000 mg | Freq: Every day | VAGINAL | Status: DC

## 2015-06-21 MED ORDER — MIRTAZAPINE 15 MG PO TABS: ORAL_TABLET | ORAL | Status: DC

## 2015-06-21 NOTE — Patient Instructions (Signed)
Thank you so much for coming to visit me today! We will obtain a wet prep today! I will contact you with the results and send in medication if needed.  Thanks again! Dr. Caroleen Hammanumley

## 2015-06-22 DIAGNOSIS — N760 Acute vaginitis: Secondary | ICD-10-CM | POA: Insufficient documentation

## 2015-06-22 NOTE — Assessment & Plan Note (Signed)
-   Moderate yeast noted on wet prep - Requests not to use Diflucan. Prescription for Monistat vaginal suppositories sent to pharmacy. - Follow up if no improvement. Consider repeat STD testing.

## 2015-06-22 NOTE — Progress Notes (Signed)
Subjective:     Patient ID: Dana Knight, female   DOB: February 24, 1976, [age] y.o.   MRN: [id number]  HPI Mrs. Dana Knight is a 40yo female presenting for suspected yeast infection. - Reports history of yeast infections in the past. Feels similar to prior. - Notes white discharge with "funny feeling" and irritation - Denies itching, genital ulcers, abdominal pain, dysuria, increased urgency or frequency, fevers - Does not want to be prescribed Diflucan. States it causes her to break out. - Negative for Chlamydia and Gonorrhea in 01/2015 - Requests refill of Remeron - PMH reviewed and updated.   Review of Systems Per HPI. Other systems negative.    Objective:   Physical Exam  Constitutional: She appears well-developed and well-nourished. No distress.  Cardiovascular: Normal rate and regular rhythm.  Exam reveals no gallop and no friction rub.   No murmur heard. Pulmonary/Chest: Effort normal. No respiratory distress. She has no wheezes. She has no rales.  Abdominal: Soft. She exhibits no distension. There is no tenderness.  Genitourinary: Vaginal discharge found.      Assessment and Plan:     Vaginal discharge - Moderate yeast noted on wet prep - Requests not to use Diflucan. Prescription for Monistat vaginal suppositories sent to pharmacy. - Follow up if no improvement. Consider repeat STD testing.

## 2015-10-11 ENCOUNTER — Ambulatory Visit (INDEPENDENT_AMBULATORY_CARE_PROVIDER_SITE_OTHER): Payer: Managed Care, Other (non HMO) | Admitting: Family Medicine

## 2015-10-11 ENCOUNTER — Ambulatory Visit (INDEPENDENT_AMBULATORY_CARE_PROVIDER_SITE_OTHER): Payer: Managed Care, Other (non HMO) | Admitting: *Deleted

## 2015-10-11 ENCOUNTER — Encounter: Payer: Self-pay | Admitting: Family Medicine

## 2015-10-11 VITALS — BP 119/75 | HR 71 | Temp 98.5°F | Wt 150.8 lb

## 2015-10-11 DIAGNOSIS — Z111 Encounter for screening for respiratory tuberculosis: Secondary | ICD-10-CM

## 2015-10-11 DIAGNOSIS — A499 Bacterial infection, unspecified: Secondary | ICD-10-CM

## 2015-10-11 DIAGNOSIS — B373 Candidiasis of vulva and vagina: Secondary | ICD-10-CM | POA: Diagnosis not present

## 2015-10-11 DIAGNOSIS — B3731 Acute candidiasis of vulva and vagina: Secondary | ICD-10-CM

## 2015-10-11 DIAGNOSIS — B9689 Other specified bacterial agents as the cause of diseases classified elsewhere: Secondary | ICD-10-CM

## 2015-10-11 DIAGNOSIS — N760 Acute vaginitis: Secondary | ICD-10-CM | POA: Diagnosis not present

## 2015-10-11 LAB — POCT WET PREP (WET MOUNT): CLUE CELLS WET PREP WHIFF POC: NEGATIVE

## 2015-10-11 MED ORDER — MICONAZOLE NITRATE 200 MG VA SUPP: 200.0000 mg | Freq: Every day | VAGINAL | Status: DC

## 2015-10-11 MED ORDER — METRONIDAZOLE 500 MG PO TABS: 500.0000 mg | ORAL_TABLET | Freq: Two times a day (BID) | ORAL | Status: DC

## 2015-10-11 NOTE — Patient Instructions (Signed)
Thank you for coming in to clinic today.  1. Wet prep sample showed Yeast and some small amount of BV - FIRST - for Bacterial Vaginosis (BV) take Metronidazole 500mg  twice daily for 7 days, do not drink alcohol on this med as it will cause nausea/vomiting. - After your treatment, then start the Miconazole Yeast vaginal suppository at bedtime for 3 nights, given 1 refill  - This problem can be recurrent. Some people are more likely to get it than others, and it has to do with normal body chemistry and vaginal environment. One of the biggest risk factors for causing BV is unprotected sexual intercourse (without a condom), because semen can change the chemistry of your vaginal environment, causing the "good bacteria" reduce and the other bacteria to grow and cause your symptoms. Recommend to start using condoms with intercourse to see if this helps reduce your symptoms, do this especially for next 1 week while on treatment. Some other recommendations include taking a daily probiotic and yogurt can help reduce BV as well, this is not proven to work in all patients.  Please schedule a follow-up appointment with Dr Waynetta SandyWight as needed within 2-4 weeks if no improvement for vaginitis  If you have any other questions or concerns, please feel free to call the clinic to contact me. You may also schedule an earlier appointment if necessary.  However, if your symptoms get significantly worse, please go to the Emergency Department to seek immediate medical attention.  Saralyn PilarAlexander Miyoshi Ligas, DO [facility]

## 2015-10-11 NOTE — Progress Notes (Signed)
   PPD placed Left Forearm.  Pt to return 10/14/2015, [place] for reading.  Pt tolerated intradermal injection. Clovis PuMartin, Tamika L, RN

## 2015-10-11 NOTE — Progress Notes (Signed)
Subjective:    Patient ID: Dana Knight, female    DOB: 05-20-1976, 40 y.o.   MRN: [id number]  Dana AgeeLatoya D Knight is a 40 y.o. female presenting on 10/11/2015 for Vaginitis   Patient presents for a same day appointment.   HPI   VAGINITIS: [place] at [place] on cruise ship, reports symptoms of about 1 week of vaginal irritation and some itching, without significant vaginal discharge. - No regular periods, no LMP on IUD - Admits history of constipation, and occasional abdominal cramping (since resolved) - Denies any fevers/chills, active abdominal pain, vaginal bleeding, dysuria, rash    Social History  Substance Use Topics  . Smoking status: Never Smoker   . Smokeless tobacco: Not on file  . Alcohol Use: Yes     Comment: occasional    Review of Systems Per HPI unless specifically indicated above     Objective:    BP 119/75 mmHg  Pulse 71  Temp(Src) 98.5 F (36.9 C) (Oral)  Wt 150 lb 12.8 oz (68.402 kg)  Wt Readings from Last 3 Encounters:  10/11/15 150 lb 12.8 oz (68.402 kg)  06/21/15 137 lb 14.4 oz (62.551 kg)  01/29/15 146 lb (66.225 kg)    Physical Exam  Constitutional: She appears well-developed and well-nourished. No distress.  Well-appearing, comfortable, cooperative  Cardiovascular: Normal rate.   Abdominal: Soft. She exhibits no distension. There is no tenderness.  Genitourinary:  Deferred pelvic exam today, patient opted for self collect wet prep sample. Has recently had pelvic within past few months.  Neurological: She is alert.  Skin: Skin is warm and dry. She is not diaphoretic.  Nursing note and vitals reviewed.  Results for orders placed or performed in visit on 10/11/15  POCT Wet Prep [facility] [place])  Result Value Ref Range   Source Wet Prep POC vaginal    WBC, Wet Prep HPF POC 1-5    Bacteria Wet Prep HPF POC Many (A) None, Few, Too numerous to count   Clue Cells Wet Prep HPF POC Few (A) None, Too numerous to count   Clue Cells  Wet Prep Whiff POC Negative Whiff    Yeast Wet Prep HPF POC Moderate    Trichomonas Wet Prep HPF POC NONE       Assessment & Plan:   Problem List Items Addressed This Visit    Vaginitis - Primary    H/o recurrent vaginitis. Wet prep confirms moderate yeast and few clue cells. - Treat both BV and yeast - Prevention precautions given      Relevant Orders   POCT Wet Prep [facility] [place]) (Completed)    Other Visit Diagnoses    Yeast vaginitis        Moderate yeast on self collect wet prep. Repeat course Miconazole 200mg  vag suppository nightly x 3 given +1 refill, last course in 06/2015.    Relevant Medications    metroNIDAZOLE (FLAGYL) 500 MG tablet    miconazole (MICOTIN) 200 MG vaginal suppository    BV (bacterial vaginosis)        Few clue cells on wet prep, otherwise whiff negative and no significant discharge. However patient opts for empiric Flagyl course. Then start miconazole.    Relevant Medications    metroNIDAZOLE (FLAGYL) 500 MG tablet       Meds ordered this encounter  Medications  . metroNIDAZOLE (FLAGYL) 500 MG tablet    Sig: Take 1 tablet (500 mg total) by mouth 2 (two) times daily. Do not drink alcohol  while taking this medicine.    Dispense:  14 tablet    Refill:  0  . miconazole (MICOTIN) 200 MG vaginal suppository    Sig: Place 1 suppository (200 mg total) vaginally at bedtime. For 3 nights.    Dispense:  3 suppository    Refill:  1      Follow up plan: Return in about 4 weeks (around 11/08/2015), or if symptoms worsen or fail to improve, for vaginitis.  Saralyn Pilar, DO [facility], PGY-3

## 2015-10-11 NOTE — Assessment & Plan Note (Signed)
H/o recurrent vaginitis. Wet prep confirms moderate yeast and few clue cells. - Treat both BV and yeast - Prevention precautions given

## 2015-10-14 ENCOUNTER — Encounter: Payer: Self-pay | Admitting: *Deleted

## 2015-10-14 ENCOUNTER — Ambulatory Visit (INDEPENDENT_AMBULATORY_CARE_PROVIDER_SITE_OTHER): Payer: Managed Care, Other (non HMO) | Admitting: *Deleted

## 2015-10-14 DIAGNOSIS — Z7689 Persons encountering health services in other specified circumstances: Secondary | ICD-10-CM

## 2015-10-14 DIAGNOSIS — Z111 Encounter for screening for respiratory tuberculosis: Secondary | ICD-10-CM

## 2015-10-14 LAB — TB SKIN TEST
INDURATION: 0 mm
TB SKIN TEST: NEGATIVE

## 2015-10-14 NOTE — Progress Notes (Signed)
   PPD Reading Note PPD read and results entered in EpicCare. Result: 0 mm induration. Interpretation: Negative If test not read within 48-72 hours of initial placement, patient advised to repeat in other arm 1-3 weeks after this test. Allergic reaction: no  Martin, Tamika L, RN  

## 2015-10-16 ENCOUNTER — Ambulatory Visit (INDEPENDENT_AMBULATORY_CARE_PROVIDER_SITE_OTHER): Payer: Managed Care, Other (non HMO) | Admitting: Family Medicine

## 2015-10-16 VITALS — BP 118/59 | HR 73 | Temp 98.1°F | Ht 65.0 in | Wt 151.0 lb

## 2015-10-16 DIAGNOSIS — M25561 Pain in right knee: Secondary | ICD-10-CM

## 2015-10-16 MED ORDER — MELOXICAM 15 MG PO TABS: 15.0000 mg | ORAL_TABLET | Freq: Every day | ORAL | Status: DC

## 2015-10-16 NOTE — Assessment & Plan Note (Signed)
Seems to be related to a traumatic fall on the parking lot about 6 months ago but primarily only affecting right knee (both knees affected in the fall). She has some PFS symptoms, so feel she is having a fair amount of inflammation/swelling of the patellar tendon and possibly Hoffa fat pad. Will continue RICE (as much as possible), add meloxicam since high dose ibuprofen was not as effective, and refer to physical therapy; if not showing improvement would recommend sports medicine referral.

## 2015-10-16 NOTE — Patient Instructions (Signed)
Ice anytime you are starting to feel pain, swelling  Meloxicam - take once a day, take it with food.  Referral to physical therapy  If still having issues after at least 1 month of physical therapy we would consider referral to the sports medicine doctors

## 2015-10-16 NOTE — Progress Notes (Signed)
   Subjective:    Patient ID: Dana Knight, female    DOB: 1975/10/13, 40 y.o.   MRN: [id number]  HPI  CC: right knee pain  # Right knee pain:  Front of knee  Going on for "months" -- feels like it is getting worse  Pain is in the front, not bad today, gets up to a 7/10.   Worse after being at work, walking around.  Pain goes some into the thigh  She has days where she doesn't notice it at all -- at home and relaxing  At times it will give out, she will be walking or going up steps and feels it give out. Happens "a lot", daily.  Tried ibuprofen 800mg , ice packs. They do not really help.  6 months ago she had fallen on both knees fell on concrete parking lot. Took a long time for them to heal.  ROS: no numbness or tingling. No weakness.  Social Hx: never smoker  Review of Systems   See HPI for ROS.   Past medical history, surgical, family, and social history reviewed and updated in the EMR as appropriate. Objective:  BP 118/59 mmHg  Pulse 73  Temp(Src) 98.1 F (36.7 C) (Oral)  Ht 5\' 5"  (1.651 m)  Wt 151 lb (68.493 kg)  BMI 25.13 kg/m2 Vitals and nursing note reviewed  General: no apparent distress  MSK: Right knee: there is obvious swelling inferior to patella on the right, some swelling when compared to the left but not as much; this appears bilateral to the patellar tendon. Patella is midline and moves well. On ROM testing it seems to track appropriately but there is some crepitation noted (on both sides, right > left). Tender to palpation over patellar tendon, lateral joint line. There are solid/consistent end points for ACL and PCL.   Assessment & Plan:  Right anterior knee pain Seems to be related to a traumatic fall on the parking lot about 6 months ago but primarily only affecting right knee (both knees affected in the fall). She has some PFS symptoms, so feel she is having a fair amount of inflammation/swelling of the patellar tendon and possibly Hoffa fat  pad. Will continue RICE (as much as possible), add meloxicam since high dose ibuprofen was not as effective, and refer to physical therapy; if not showing improvement would recommend sports medicine referral.

## 2015-10-30 ENCOUNTER — Ambulatory Visit: Payer: Managed Care, Other (non HMO) | Admitting: Physical Therapy

## 2015-11-07 DIAGNOSIS — M79672 Pain in left foot: Secondary | ICD-10-CM | POA: Diagnosis not present

## 2015-11-07 DIAGNOSIS — M722 Plantar fascial fibromatosis: Secondary | ICD-10-CM | POA: Diagnosis not present

## 2015-11-07 DIAGNOSIS — M79671 Pain in right foot: Secondary | ICD-10-CM | POA: Diagnosis not present

## 2016-04-20 ENCOUNTER — Other Ambulatory Visit: Payer: Self-pay | Admitting: Family Medicine

## 2016-05-22 ENCOUNTER — Ambulatory Visit (INDEPENDENT_AMBULATORY_CARE_PROVIDER_SITE_OTHER): Payer: Managed Care, Other (non HMO) | Admitting: Obstetrics and Gynecology

## 2016-05-22 ENCOUNTER — Encounter: Payer: Self-pay | Admitting: Obstetrics and Gynecology

## 2016-05-22 ENCOUNTER — Other Ambulatory Visit (HOSPITAL_COMMUNITY)
Admission: RE | Admit: 2016-05-22 | Discharge: 2016-05-22 | Disposition: A | Discharge status: Home / Self Care | Payer: Managed Care, Other (non HMO) | Source: Ambulatory Visit | Attending: Family Medicine | Admitting: Family Medicine

## 2016-05-22 VITALS — BP 124/64 | HR 80 | Temp 97.9°F | Wt 157.0 lb

## 2016-05-22 DIAGNOSIS — R69 Illness, unspecified: Secondary | ICD-10-CM | POA: Diagnosis not present

## 2016-05-22 DIAGNOSIS — Z113 Encounter for screening for infections with a predominantly sexual mode of transmission: Secondary | ICD-10-CM

## 2016-05-22 DIAGNOSIS — Z124 Encounter for screening for malignant neoplasm of cervix: Secondary | ICD-10-CM | POA: Diagnosis not present

## 2016-05-22 DIAGNOSIS — Z30433 Encounter for removal and reinsertion of intrauterine contraceptive device: Secondary | ICD-10-CM

## 2016-05-22 DIAGNOSIS — Z1151 Encounter for screening for human papillomavirus (HPV): Secondary | ICD-10-CM | POA: Diagnosis present

## 2016-05-22 DIAGNOSIS — Z01419 Encounter for gynecological examination (general) (routine) without abnormal findings: Secondary | ICD-10-CM | POA: Diagnosis not present

## 2016-05-22 LAB — POCT URINE PREGNANCY: PREG TEST UR: NEGATIVE

## 2016-05-22 MED ORDER — LEVONORGESTREL 20 MCG/24HR IU IUD
INTRAUTERINE_SYSTEM | Freq: Once | INTRAUTERINE | Status: AC
  Administered 2016-05-22: 1 via INTRAUTERINE

## 2016-05-22 NOTE — Patient Instructions (Addendum)
IUD AFTERCARE  1.  Uterine cramping is common after IUD placement.  You  May using heating pads, ibuprofen, and tylenol.  If it becomes very painful, you notice fever or chills, unusual bleeding, or foul smelling vaginal discharge please call the office. 2.  Irregular vaginal bleeding is common in the first few months after IUD placement but will often get better in the first six months.   3.  IUDs do not protect against STD such as HIV, genital warts, gonorrhea, chlamydia, herpes.  Please continue to protect yourself against these infections and contact the office if you think you may have contracted an infection. 4.  Checking for proper placement:  You may feel for your IUD strings as shown today by placing you fingers into your vagina.  Do not pull on the strings.  If your Mirena falls out, you can feel the hard plastic part of the IUD, or you can no longer feel the strings, please contact the office. 5.  Your Mirena should be removed or replaced in 5 years. 6.  Please see package insert or [email or web address] for full patient information. 7.  Make follow-up appointment for 4 weeks.  

## 2016-05-22 NOTE — Addendum Note (Signed)
Addended by: Jone BasemanFLEEGER, JESSICA D on: 05/22/2016 03:28 PM   Modules accepted: Orders

## 2016-05-22 NOTE — Progress Notes (Signed)
     Subjective: Chief Complaint  Patient presents with  . Contraception     HPI: Dana Knight is a 40 y.o. presenting to clinic today to discuss the following:  #Contraception Currently with IUD in place Due for removal and wants new one placed Sexually active with partner Does not desire anymore children No complications with last IUD Continues to have periods but more like spotting every month and they are light  Due for pap smear at the end of this month. Wants to go ahead and get tested now with STD testing.   Health Maintenance: Up to date   History  Smoking Status  . Never Smoker  Smokeless Tobacco  . Not on file    ROS noted in HPI.  Past Medical, Surgical, Social, and Family History Reviewed & Updated per EMR.   Objective: BP 124/64   Pulse 80   Temp 97.9 F (36.6 C) (Oral)   Wt 157 lb (71.2 kg)   BMI 26.13 kg/m  Vitals and nursing notes reviewed  Physical Exam  Constitutional: She is well-developed, well-nourished, and in no distress.  Cardiovascular: Normal rate.   Pulmonary/Chest: Effort normal.  Abdominal: Soft. There is no tenderness.  Genitourinary: Vagina normal, uterus normal, cervix normal, right adnexa normal, left adnexa normal and vulva normal. No vaginal discharge found.  Skin: Skin is warm and dry.    Results for orders placed or performed in visit on 05/22/16 (from the past 72 hour(s))  POCT urine pregnancy     Status: None   Collection Time: 05/22/16  1:38 PM  Result Value Ref Range   Preg Test, Ur Negative Negative    Assessment/Plan: 1. Encounter for removal and reinsertion of intrauterine contraceptive device (IUD) Please see separate procedure note - POCT urine pregnancy - negative  2. Screening for malignant neoplasm of cervix Pap smear performed today. Will contact patient about results.  - Cytology - PAP [place]  3. Screening examination for STD (sexually transmitted disease) STD check today with pap smear.      Orders Placed This Encounter  Procedures  . POCT urine pregnancy     Caryl AdaJazma Sibley Rolison, DO 05/22/2016, 1:36 PM PGY-3, [facility]

## 2016-05-22 NOTE — Progress Notes (Signed)
PROCEDURE NOTE: IUD REMOVAL AND RE-INSERTION  IUD REMOVAL: Patient given informed consent for IUD removal and re-insertion of new IUD. Signed copy in the chart. Appropriate time out taken. Sterile technique used. Patient placed in the lithotomy position and the cervix brought into view using speculum. The IUD strings were identified coming from the cervical os. These strings were grasped with ring forceps, and the IUD withdrawn gently from the uterus. There were no complications and no blood loss.    IUD RE-INSERTION: Sterile technique maintained. Cervix cleansed three times with  betadine swabs.  A tenaculum was placed into the anterior lip of the cervix and a uterine sound was used to measure uterine size.  A Mirena IUD was placed into the endometrial cavity, deployed and secured. The applicator was removed. The strings were trimmed to 2 centimeters.  All equipment was removed and accounted for.There were no complications and the patient tolerated the procedure well.   She was given handouts for post procedure instructions and information about the IUD including a card with the time of recommended removal.  [name], DO 05/22/2016, 1:41 PM PGY-3, [facility]

## 2016-05-22 NOTE — Progress Notes (Signed)
Nov. 2012  No more kids Spot every month but real light No complications

## 2016-05-26 LAB — CERVICOVAGINAL ANCILLARY ONLY
CHLAMYDIA, DNA PROBE: NEGATIVE
NEISSERIA GONORRHEA: NEGATIVE

## 2016-05-27 ENCOUNTER — Other Ambulatory Visit: Payer: Self-pay | Admitting: Obstetrics and Gynecology

## 2016-05-27 LAB — CYTOLOGY - PAP
Diagnosis: NEGATIVE
HPV (WINDOPATH): NOT DETECTED

## 2016-05-27 MED ORDER — MICONAZOLE NITRATE 200 & 2 MG-% (9GM) VA KIT: 1.0000 | PACK | Freq: Every evening | VAGINAL | 0 refills | Status: AC

## 2016-07-20 DIAGNOSIS — N951 Menopausal and female climacteric states: Secondary | ICD-10-CM | POA: Diagnosis not present

## 2016-07-20 DIAGNOSIS — R635 Abnormal weight gain: Secondary | ICD-10-CM | POA: Diagnosis not present

## 2016-07-28 DIAGNOSIS — K219 Gastro-esophageal reflux disease without esophagitis: Secondary | ICD-10-CM | POA: Diagnosis not present

## 2016-07-28 DIAGNOSIS — E559 Vitamin D deficiency, unspecified: Secondary | ICD-10-CM | POA: Diagnosis not present

## 2016-07-28 DIAGNOSIS — N951 Menopausal and female climacteric states: Secondary | ICD-10-CM | POA: Diagnosis not present

## 2016-07-28 DIAGNOSIS — E782 Mixed hyperlipidemia: Secondary | ICD-10-CM | POA: Diagnosis not present

## 2016-07-28 DIAGNOSIS — E663 Overweight: Secondary | ICD-10-CM | POA: Diagnosis not present

## 2016-08-10 DIAGNOSIS — K219 Gastro-esophageal reflux disease without esophagitis: Secondary | ICD-10-CM | POA: Diagnosis not present

## 2016-08-10 DIAGNOSIS — E663 Overweight: Secondary | ICD-10-CM | POA: Diagnosis not present

## 2016-08-10 DIAGNOSIS — E538 Deficiency of other specified B group vitamins: Secondary | ICD-10-CM | POA: Diagnosis not present

## 2016-08-10 DIAGNOSIS — E782 Mixed hyperlipidemia: Secondary | ICD-10-CM | POA: Diagnosis not present

## 2016-08-17 DIAGNOSIS — K219 Gastro-esophageal reflux disease without esophagitis: Secondary | ICD-10-CM | POA: Diagnosis not present

## 2016-08-17 DIAGNOSIS — E663 Overweight: Secondary | ICD-10-CM | POA: Diagnosis not present

## 2016-08-17 DIAGNOSIS — E782 Mixed hyperlipidemia: Secondary | ICD-10-CM | POA: Diagnosis not present

## 2016-11-10 ENCOUNTER — Other Ambulatory Visit: Payer: Self-pay | Admitting: Obstetrics and Gynecology

## 2016-11-11 NOTE — Telephone Encounter (Signed)
Last fill had note stating patient needs PCP follow-up for further fills.

## 2016-11-30 ENCOUNTER — Ambulatory Visit (INDEPENDENT_AMBULATORY_CARE_PROVIDER_SITE_OTHER): Payer: Managed Care, Other (non HMO) | Admitting: Obstetrics and Gynecology

## 2016-11-30 ENCOUNTER — Encounter (INDEPENDENT_AMBULATORY_CARE_PROVIDER_SITE_OTHER): Payer: Self-pay

## 2016-11-30 ENCOUNTER — Encounter: Payer: Self-pay | Admitting: Obstetrics and Gynecology

## 2016-11-30 ENCOUNTER — Encounter: Payer: Self-pay | Admitting: Psychology

## 2016-11-30 VITALS — BP 108/60 | HR 74 | Temp 98.2°F | Ht 65.0 in | Wt 140.0 lb

## 2016-11-30 DIAGNOSIS — R634 Abnormal weight loss: Secondary | ICD-10-CM

## 2016-11-30 DIAGNOSIS — F332 Major depressive disorder, recurrent severe without psychotic features: Secondary | ICD-10-CM | POA: Diagnosis not present

## 2016-11-30 DIAGNOSIS — R519 Headache, unspecified: Secondary | ICD-10-CM | POA: Insufficient documentation

## 2016-11-30 DIAGNOSIS — R51 Headache: Secondary | ICD-10-CM

## 2016-11-30 DIAGNOSIS — R69 Illness, unspecified: Secondary | ICD-10-CM | POA: Diagnosis not present

## 2016-11-30 DIAGNOSIS — G44209 Tension-type headache, unspecified, not intractable: Secondary | ICD-10-CM

## 2016-11-30 HISTORY — DX: Abnormal weight loss: R63.4

## 2016-11-30 MED ORDER — MIRTAZAPINE 15 MG PO TABS: 15.0000 mg | ORAL_TABLET | Freq: Every day | ORAL | 3 refills | Status: DC

## 2016-11-30 NOTE — Assessment & Plan Note (Signed)
Has lost 17lbs since December unintentionally. Patient has had loss of appetite in setting of worsening depression. Rx refilled for Remeron which will hopefully also help appetite. Will check blood work in setting of weight loss.

## 2016-11-30 NOTE — Assessment & Plan Note (Signed)
PHQ-9 today 24 and in the severe range. Currently symptomatic. Patient was on Zoloft before but did not liek due to side effects. Remeron worked will for her so will restart this. [facility] Patient to follow-up in 3 weeks for recheck with new PCP.

## 2016-11-30 NOTE — Progress Notes (Signed)
     Subjective: Chief Complaint  Patient presents with  . Follow-up    depression medication  . Headache     HPI: Dana Knight is a 41 y.o. presenting to clinic today to discuss the following:  #Depression Feels like she needs to be back on medication Has been feeling overwhelmed and depressed Having a lot of life stressors Mood has been affecting her job and she is making errors Not focused Denies SI/HI Worsening over the last year Not on medication  #Headache Started having daily headaches for the last week relieved with rest Excedrin is not working Located bilaterally in front and in the back of neck Quality: pressure Denies phonophobia or photophobia No history of headaches or migraines  #Weight loss Has not had much of an appetite losing weight unintentionally Having night sweats, denies fevers  Health Maintenance: Up to date    ROS noted in HPI.   Past Medical, Surgical, Social, and Family History Reviewed & Updated per EMR.    History  Smoking Status  . Never Smoker  Smokeless Tobacco  . Never Used    Objective: BP 108/60   Pulse 74   Temp 98.2 F (36.8 C) (Oral)   Ht 5\' 5"  (1.651 m)   Wt 140 lb (63.5 kg)   BMI 23.30 kg/m  Vitals and nursing notes reviewed  Physical Exam  Constitutional: She is oriented to person, place, and time and well-developed, well-nourished, and in no distress.  HENT:  Head: Normocephalic and atraumatic.  Eyes: Conjunctivae and EOM are normal.  Neck: Normal range of motion. Neck supple. No thyromegaly present.  Cardiovascular: Normal rate and regular rhythm.   Pulmonary/Chest: Effort normal and breath sounds normal.  Neurological: She is alert and oriented to person, place, and time. No cranial nerve deficit.  Psychiatric: She exhibits a depressed mood. She expresses no homicidal and no suicidal ideation.  tearful    Depression screen [facility] 11/30/2016 05/22/2016 06/21/2015 01/29/2015 10/03/2014  Decreased  Interest 3 0 0 0 0  Down, Depressed, Hopeless 3 0 1 0 0  PHQ - 2 Score 6 0 1 0 0  Altered sleeping 3 - - - -  Tired, decreased energy 3 - - - -  Change in appetite 3 - - - -  Feeling bad or failure about yourself  3 - - - -  Trouble concentrating 3 - - - -  Moving slowly or fidgety/restless 3 - - - -  Suicidal thoughts 0 - - - -  PHQ-9 Score 24 - - - -  Difficult doing work/chores Extremely dIfficult - - - -    Assessment/Plan: Please see problem based Assessment and Plan PATIENT EDUCATION PROVIDED: See AVS     Orders Placed This Encounter  Procedures  . TSH  . CBC  . Comprehensive metabolic panel    Order Specific Question:   Has the patient fasted?    Answer:   No    Meds ordered this encounter  Medications  . mirtazapine (REMERON) 15 MG tablet    Sig: Take 1 tablet (15 mg total) by mouth at bedtime.    Dispense:  30 tablet    Refill:  3    Caryl AdaJazma Jaycelyn Orrison, DO 11/30/2016, 10:45 AM PGY-3, [facility]

## 2016-11-30 NOTE — Progress Notes (Unsigned)
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   Presenting Issue:  Patient feeling low mood and difficulty managing stress  Assessment / Plan / Recommendations: [facility] met very briefly with patient as had to meet with another patient. [facility] introduced services and asked patient to identify goal of working together. Patient would like to have her sister leave the home given her sister's alcoholism. She feels overwhelmed by this and finds that her stress carries over into her job. Given patient's preference for Monday's, [facility] scheduled patient to meet with Larene Beach on 7/6 at 1:30.  Warmhandoff:    Warm Hand Off Completed.

## 2016-11-30 NOTE — Patient Instructions (Addendum)
Will contact you about labwork when completed  This office Provides Integrated Care That means we have [facility] Health Consult[facility](BHCs) who work with your doctors to help with  . Habits,  . Behaviors, or  . Emotions  that get in the way of your life or health. These consultants help target specific goals or problems that you want to work on.   [facility] at [facility] are free  Some examples of things Consultants help with are: . Stress                                   . Managing chronic problems (diabetes, chronic pain, asthma, etc) . Parenting concerns  . Referral to community resources . Substance use    . Improving diet and exercise habits . Sleep problems    . Family/Relationship problems . Grief     . Mental health concerns   . Lifestyle changes  . Taking medications as prescribed    (call the office [phone]) to schedule an appointment    Tension Headache A tension headache is pain, pressure, or aching that is felt over the front and sides of your head. These headaches can last from 30 minutes to several days. Follow these instructions at home: Managing pain  Take over-the-counter and prescription medicines only as told by your doctor.  Lie down in a dark, quiet room when you have a headache.  If directed, apply ice to your head and neck area: ? Put ice in a plastic bag. ? Place a towel between your skin and the bag. ? Leave the ice on for 20 minutes, 2-3 times per day.  Use a heating pad or a hot shower to apply heat to your head and neck area as told by your doctor. Eating and drinking  Eat meals on a regular schedule.  Do not drink a lot of alcohol.  Do not use a lot of caffeine, or stop using caffeine. General instructions  Keep all follow-up visits as told by your doctor. This is important.  Keep a journal to find out if certain things bring on headaches. For example, write down: ? What you eat and drink. ? How much  sleep you get. ? Any change to your diet or medicines.  Try getting a massage, or doing other things that help you to relax.  Lessen stress.  Sit up straight. Do not tighten (tense) your muscles.  Do not use tobacco products. This includes cigarettes, chewing tobacco, or e-cigarettes. If you need help quitting, ask your doctor.  Exercise regularly as told by your doctor.  Get enough sleep. This may mean 7-9 hours of sleep. Contact a doctor if:  Your symptoms are not helped by medicine.  You have a headache that feels different from your usual headache.  You feel sick to your stomach (nauseous) or you throw up (vomit).  You have a fever. Get help right away if:  Your headache becomes very bad.  You keep throwing up.  You have a stiff neck.  You have trouble seeing.  You have trouble speaking.  You have pain in your eye or ear.  Your muscles are weak or you lose muscle control.  You lose your balance or you have trouble walking.  You feel like you will pass out (faint) or you pass out.  You have confusion. This information is not intended to replace advice given  to you by your health care provider. Make sure you discuss any questions you have with your health care provider. Document Released: 08/26/2009 Document Revised: 01/30/2016 Document Reviewed: 09/24/2014 Elsevier Interactive Patient Education  [employer].

## 2016-11-30 NOTE — Assessment & Plan Note (Signed)
Consistent with tensio headache in setting of worsening depression and increased stress. Discussed conservative treatments and stress reduction to help headaches. Continue to take OTC medications as needed.

## 2016-12-01 ENCOUNTER — Telehealth: Payer: Self-pay | Admitting: Obstetrics and Gynecology

## 2016-12-01 LAB — COMPREHENSIVE METABOLIC PANEL
ALBUMIN: 5 g/dL (ref 3.5–5.5)
ALK PHOS: 52 IU/L (ref 39–117)
ALT: 12 IU/L (ref 0–32)
AST: 18 IU/L (ref 0–40)
Albumin/Globulin Ratio: 1.9 (ref 1.2–2.2)
BILIRUBIN TOTAL: 1.6 mg/dL — AB (ref 0.0–1.2)
BUN / CREAT RATIO: 7 — AB (ref 9–23)
BUN: 6 mg/dL (ref 6–20)
CHLORIDE: 108 mmol/L — AB (ref 96–106)
CO2: 21 mmol/L (ref 20–29)
Calcium: 9.7 mg/dL (ref 8.7–10.2)
Creatinine, Ser: 0.82 mg/dL (ref 0.57–1.00)
GFR calc non Af Amer: 91 mL/min/{1.73_m2} (ref >59)
GFR, EST AFRICAN AMERICAN: 105 mL/min/{1.73_m2} (ref >59)
GLUCOSE: 82 mg/dL (ref 65–99)
Globulin, Total: 2.7 g/dL (ref 1.5–4.5)
POTASSIUM: 3.6 mmol/L (ref 3.5–5.2)
SODIUM: 150 mmol/L — AB (ref 134–144)
TOTAL PROTEIN: 7.7 g/dL (ref 6.0–8.5)

## 2016-12-01 LAB — CBC
HEMATOCRIT: 42.7 % (ref 34.0–46.6)
Hemoglobin: 14.4 g/dL (ref 11.1–15.9)
MCH: 31.3 pg (ref 26.6–33.0)
MCHC: 33.7 g/dL (ref 31.5–35.7)
MCV: 93 fL (ref 79–97)
PLATELETS: 346 10*3/uL (ref 150–379)
RBC: 4.6 x10E6/uL (ref 3.77–5.28)
RDW: 13.1 % (ref 12.3–15.4)
WBC: 3.5 10*3/uL (ref 3.4–10.8)

## 2016-12-01 LAB — TSH: TSH: 2.08 u[IU]/mL (ref 0.450–4.500)

## 2016-12-01 NOTE — Telephone Encounter (Signed)
Pt needs a letter writing pt out of work due to depression. Pt states she is unable to focus and can't do her job and needs a break. ep

## 2016-12-02 ENCOUNTER — Encounter: Payer: Self-pay | Admitting: Obstetrics and Gynecology

## 2016-12-02 NOTE — Telephone Encounter (Signed)
Pt has been informed and a FU apt has been made with pcp for next week.

## 2016-12-02 NOTE — Telephone Encounter (Signed)
Letter made. Please print and provide to patient. Also please inform patient that she will need to see me next week to be evaluated for return to work. I would also suggest she get FMLA paperwork to secure her job. Thanks!

## 2016-12-09 ENCOUNTER — Ambulatory Visit: Payer: Managed Care, Other (non HMO) | Admitting: Obstetrics and Gynecology

## 2016-12-21 ENCOUNTER — Ambulatory Visit: Payer: Managed Care, Other (non HMO)

## 2016-12-28 ENCOUNTER — Ambulatory Visit: Payer: Managed Care, Other (non HMO) | Admitting: Psychology

## 2017-02-16 ENCOUNTER — Ambulatory Visit: Payer: Managed Care, Other (non HMO) | Admitting: Internal Medicine

## 2017-02-23 ENCOUNTER — Other Ambulatory Visit: Payer: Self-pay | Admitting: *Deleted

## 2017-02-23 ENCOUNTER — Ambulatory Visit (INDEPENDENT_AMBULATORY_CARE_PROVIDER_SITE_OTHER): Payer: Managed Care, Other (non HMO) | Admitting: Family Medicine

## 2017-02-23 ENCOUNTER — Encounter: Payer: Self-pay | Admitting: Family Medicine

## 2017-02-23 VITALS — BP 110/64 | HR 73 | Temp 98.0°F | Ht 65.0 in | Wt 150.0 lb

## 2017-02-23 DIAGNOSIS — N898 Other specified noninflammatory disorders of vagina: Secondary | ICD-10-CM

## 2017-02-23 DIAGNOSIS — R300 Dysuria: Secondary | ICD-10-CM

## 2017-02-23 LAB — POCT URINALYSIS DIP (MANUAL ENTRY)
BILIRUBIN UA: NEGATIVE mg/dL
Bilirubin, UA: NEGATIVE
Glucose, UA: NEGATIVE mg/dL
Leukocytes, UA: NEGATIVE
NITRITE UA: NEGATIVE
PH UA: 6 (ref 5.0–8.0)
PROTEIN UA: NEGATIVE mg/dL
RBC UA: NEGATIVE
SPEC GRAV UA: 1.02 (ref 1.010–1.025)
Urobilinogen, UA: 1 E.U./dL

## 2017-02-23 LAB — POCT WET PREP (WET MOUNT)
Clue Cells Wet Prep Whiff POC: NEGATIVE
TRICHOMONAS WET PREP HPF POC: ABSENT

## 2017-02-23 MED ORDER — MIRTAZAPINE 15 MG PO TABS: 15.0000 mg | ORAL_TABLET | Freq: Every day | ORAL | 3 refills | Status: DC

## 2017-02-23 NOTE — Progress Notes (Signed)
U

## 2017-02-23 NOTE — Progress Notes (Signed)
   Subjective:    Patient ID: Dana Knight, female    DOB: 07/22/1975, 41 y.o.   MRN: [id number]   CC: Concerns for BV/yeast infection  HPI: Patient is a 41 yo female who presents today with concerns for yeast infection or bacterial vaginosis. Patient report that two weeks ago she had vaginal irritation and scant discharge without any odor. Patient made an appointment to be seen in clinic but missed it. Since then, symptoms have improved but she wanted to make sure that she did not have a yeast infection or BV. Patient denies any urinary symptoms, abdominal or flank pain. Patient has IUD and is not currently sexual active. Patient is not currently interested in STD testing.  Smoking status reviewed   ROS: all other systems were reviewed and are negative other than in the HPI   Past Medical History:  Diagnosis Date  . Abnormal Pap smear   . Anxiety   . History of chlamydia   . HSV (herpes simplex virus) anogenital infection   . No pertinent past medical history     Past Surgical History:  Procedure Laterality Date  . COLPOSCOPY  2010  . DILATION AND CURETTAGE OF UTERUS    . NO PAST SURGERIES      Objective:  BP 110/64   Pulse 73   Temp 98 F (36.7 C) (Oral)   Ht 5\' 5"  (1.651 m)   Wt 150 lb (68 kg)   SpO2 98%   BMI 24.96 kg/m   Vitals and nursing note reviewed  General: NAD, pleasant, able to participate in exam Cardiac: RRR, normal heart sounds, no murmurs. 2+ radial and PT pulses bilaterally Respiratory: CTAB, normal effort, No wheezes, rales or rhonchi Abdomen: soft, nontender, nondistended, no hepatic or splenomegaly, +BS GU: Patient self swab Extremities: no edema or cyanosis. WWP. Skin: warm and dry, no rashes noted Neuro: alert and oriented x4, no focal deficits Psych: Normal affect and mood   Assessment & Plan:   #Vaginal discharge an discomfort Patient initially presented with concerning for BV or yeast infection. Wet prep was within normal limits  without any abnormal findings and UA was also unremarkable. Patient currently is asymptomatic, but was symptomatic two weeks ago. Lab results are consistent with clinical findings. Patient will continue to monitor symptoms and will make an appointment in clinic for further evaluation if symptoms returns.   Lovena NeighboursAbdoulaye Eoin Willden, MD [facility] PGY-2

## 2017-05-18 ENCOUNTER — Encounter: Payer: Self-pay | Admitting: Internal Medicine

## 2017-05-18 ENCOUNTER — Other Ambulatory Visit: Payer: Self-pay

## 2017-05-18 ENCOUNTER — Ambulatory Visit (INDEPENDENT_AMBULATORY_CARE_PROVIDER_SITE_OTHER): Payer: Managed Care, Other (non HMO) | Admitting: Internal Medicine

## 2017-05-18 VITALS — BP 118/80 | HR 87 | Temp 98.2°F | Wt 164.0 lb

## 2017-05-18 DIAGNOSIS — J019 Acute sinusitis, unspecified: Secondary | ICD-10-CM | POA: Diagnosis not present

## 2017-05-18 DIAGNOSIS — B9689 Other specified bacterial agents as the cause of diseases classified elsewhere: Secondary | ICD-10-CM

## 2017-05-18 MED ORDER — IPRATROPIUM BROMIDE 0.06 % NA SOLN: 2.0000 | Freq: Four times a day (QID) | NASAL | 0 refills | Status: DC

## 2017-05-18 MED ORDER — BENZONATATE 200 MG PO CAPS: 200.0000 mg | ORAL_CAPSULE | Freq: Three times a day (TID) | ORAL | 0 refills | Status: DC | PRN

## 2017-05-18 MED ORDER — AMOXICILLIN 500 MG PO CAPS: 500.0000 mg | ORAL_CAPSULE | Freq: Three times a day (TID) | ORAL | 0 refills | Status: AC

## 2017-05-18 NOTE — Progress Notes (Signed)
   [facility] Phone: [phone]   Date of Visit: 05/18/2017   HPI:  Nasal congestion, cough: -Reports of cough, congestion, sinus pressure (right greater than left) for the past week.  Reports that she thought she was getting better yesterday but symptoms acutely worsened today.  Her cough is worse at night because of the postnasal drip.  She does not think she has any shortness of breath.  She is unsure if she is having fevers at home.  She reports that she feels sweaty at night.  She has tried Mucinex, DayQuil, cough drops, TheraFlu without any relief of her symptoms.  Her cough is minimally productive.. ROS: See HPI.  PMFSH:  Depression and insomnia  PHYSICAL EXAM: BP 118/80   Pulse 87   Temp 98.2 F (36.8 C) (Oral)   Wt 164 lb (74.4 kg)   SpO2 98%   BMI 27.29 kg/m  GEN: NAD, nontoxic appearing HEENT: Atraumatic, normocephalic, neck supple with shotty palpable lymph nodes laterally, EOMI, sclera mildly erythematous, tympanic membranes are normal bilaterally.  Tenderness to percussion of her right frontal and maxillary sinuses.  Nasal turbinates with some discharge otherwise unremarkable.  Posterior oropharynx mildly erythematous without any exudates.  Moist mucous membranes CV: RRR, no murmurs, rubs, or gallops PULM: CTAB, normal effort, no crackles or wheezing SKIN: No rash or cyanosis; warm and well-perfused PSYCH: Mood and affect euthymic, normal rate and volume of speech NEURO: Awake, alert, no focal deficits grossly   ASSESSMENT/PLAN:  Acute bacterial sinusitis: Symptoms initially improved then acutely worsened today which I think would indicate that she needs antibiotics for possible acute bacterial sinusitis.  Her lungs are clear to auscultation and her oxygen level is normal on room air.  She has normal effort of breathing.  She is afebrile. unlikely this is pneumonia. No recent antibiotics per patient.  Will prescribe amoxicillin 500 mg 3  times daily for 5 days.  Tessalon 200 mg 3 times daily as needed for cough.  Atrovent nasal spray until nasal congestion resolves.  Return precautions given.  Palma HolterKanishka G Glenda Kunst, MD PGY 3 [facility]

## 2017-05-18 NOTE — Patient Instructions (Signed)
Let us treat you for a sinus infection with amoxicillin 1 tablet 3 times a day for 5 days.  He can use Tessalon 1 tablet 3 times a day as needed for cough.  He can also use the nasal spray up to 4 times a day until your symptoms resolve.  Please return to clinic if you are having fevers or if your symptoms worsen.

## 2017-05-28 ENCOUNTER — Other Ambulatory Visit: Payer: Self-pay | Admitting: Internal Medicine

## 2017-06-02 ENCOUNTER — Other Ambulatory Visit: Payer: Self-pay | Admitting: Internal Medicine

## 2017-06-02 MED ORDER — MIRTAZAPINE 15 MG PO TABS: ORAL_TABLET | ORAL | 1 refills | Status: DC

## 2017-06-02 NOTE — Progress Notes (Signed)
Fax received from pharmacy requesting #90 tab of Remeron instead on #30 due to insurance purposes. Rx sent

## 2017-06-11 ENCOUNTER — Other Ambulatory Visit: Payer: Self-pay | Admitting: Internal Medicine

## 2017-06-11 NOTE — Progress Notes (Signed)
Fax received for 90day supply for atrovent nasal spray. This is not a chronic medication therefore she does not need 90 day supply.

## 2017-06-21 ENCOUNTER — Other Ambulatory Visit: Payer: Self-pay | Admitting: *Deleted

## 2017-06-22 ENCOUNTER — Ambulatory Visit: Payer: Managed Care, Other (non HMO) | Admitting: Family Medicine

## 2017-06-22 ENCOUNTER — Other Ambulatory Visit: Payer: Self-pay

## 2017-06-22 ENCOUNTER — Encounter: Payer: Self-pay | Admitting: Family Medicine

## 2017-06-22 ENCOUNTER — Other Ambulatory Visit (HOSPITAL_COMMUNITY)
Admission: RE | Admit: 2017-06-22 | Discharge: 2017-06-22 | Disposition: A | Discharge status: Home / Self Care | Payer: Managed Care, Other (non HMO) | Source: Ambulatory Visit | Attending: Family Medicine | Admitting: Family Medicine

## 2017-06-22 VITALS — BP 118/80 | HR 74 | Temp 98.2°F | Ht 64.0 in | Wt 161.4 lb

## 2017-06-22 DIAGNOSIS — B9689 Other specified bacterial agents as the cause of diseases classified elsewhere: Secondary | ICD-10-CM | POA: Insufficient documentation

## 2017-06-22 DIAGNOSIS — R69 Illness, unspecified: Secondary | ICD-10-CM | POA: Diagnosis not present

## 2017-06-22 DIAGNOSIS — Z113 Encounter for screening for infections with a predominantly sexual mode of transmission: Secondary | ICD-10-CM | POA: Diagnosis not present

## 2017-06-22 DIAGNOSIS — N898 Other specified noninflammatory disorders of vagina: Secondary | ICD-10-CM | POA: Insufficient documentation

## 2017-06-22 DIAGNOSIS — N760 Acute vaginitis: Secondary | ICD-10-CM | POA: Diagnosis not present

## 2017-06-22 LAB — POCT WET PREP (WET MOUNT)
CLUE CELLS WET PREP WHIFF POC: POSITIVE
TRICHOMONAS WET PREP HPF POC: ABSENT

## 2017-06-22 MED ORDER — METRONIDAZOLE 500 MG PO TABS: 500.0000 mg | ORAL_TABLET | Freq: Two times a day (BID) | ORAL | 0 refills | Status: DC

## 2017-06-22 MED ORDER — CLINDAMYCIN HCL 300 MG PO CAPS: 300.0000 mg | ORAL_CAPSULE | Freq: Two times a day (BID) | ORAL | 0 refills | Status: DC

## 2017-06-22 NOTE — Patient Instructions (Signed)
Thank you for coming to see me today. It was a pleasure! Today we talked about:   Your recent vaginal issues. I have sent for STD testing and will call if this is abnormal. I will call any antibiotic treatment you may need into the pharmacy if you have BV. Please consider stopping the Tide detergent. I did not notice any abnormalities on exam. I have attached some hygiene suggestions to prevent this in the future.   Healthy vaginal hygiene practices   - Avoid sleeper pajamas. Nightgowns allow air to circulate. Sleep without underpants whenever possible.  - Wear cotton underpants during the day. Double-rinse underwear after washing to avoid residual irritants. Do not use fabric softeners for underwear and swimsuits.  - Avoid tights, leotards, leggings, "skinny" jeans, and other tight-fitting clothing. Skirts and loose-fitting pants allow air to circulate.  - Avoid pantyliners. Instead use tampons or cotton pads.  - Daily warm bathing is helpful:  - Soak in clean water (no soap) for 10 to 15 minutes. Adding vinegar or baking soda to the water has not been specifically studied and may not be better than clean water alone.   - Use soap to wash regions other than the genital area just before getting out of the tub. Limit use of any soap on genital areas. Use fragance-free soaps.  - Rinse the genital area well and gently pat dry. Don't rub. Hair dryer to assist with drying can be used only if on cool setting.  - Do not use bubble baths or perfumed soaps.  - Do not use any feminine sprays, douches or powders. These contain chemicals that will irritate the skin.  - If the genital area is tender or swollen, cool compresses may relieve the discomfort. Unscented wet wipes can be used instead of toilet paper for wiping.   - Emollients, such as Vaseline, may help protect skin and can be applied to the irritated area.  - Always remember to wipe front-to-back after bowel movements. Pat  dry after urination.  - Do not sit in wet swimsuits for long periods of time after swimming  Please follow-up with your doctor as needed.  If you have any questions or concerns, please do not hesitate to call the office at [phone].  Take Care,   Dana Reatha Sur, DO

## 2017-06-22 NOTE — Assessment & Plan Note (Addendum)
confirmed on wet prep. G/C Percell Locus/Chlamydia is pending. Symptoms consistent with this. Patient called after visit and informed of prescription being sent for treatment.  - Treatment with Clindamycin 300 BID x 7 days due to patient preference not to take Flagyl. - F/U if symptoms not improving or getting worse.  - Will f/u on G/C Chlamydia and call in Rx if positive.  - Self care instructions given including avoiding douching. Handout given.  - F/U with PCP as needed.  - Return precautions including abdominal pain, fever, chills, nausea, or vomiting given.

## 2017-06-22 NOTE — Progress Notes (Signed)
   Subjective:    Patient ID: Dana Knight, female    DOB: 11-20-1975, 42 y.o.   MRN: [id number]   CC:  HPI:  Vaginal Discharge - has been ongoing for 7 days  - Reports itching near anus, burning from scratching - Denies abdominal pain, nausea or vomiting, has been spotting  - Discharge, just some spotting.  - Patient reports STD in the past.  - Sexully active with one female partner. Does not think he is having sex with any one else.  - LMP was 30 day ago.  - Denies burning with urination, no hematuria.  - Patient reports having had BV in the past and yeast.  - Patient denies douching.    Smoking status reviewed  Review of Systems  Per HPI, else denies recent illness, fever, headache, changes in vision, chest pain, shortness of breath, abdominal pain, N/V/D, weakness  Patient Active Problem List   Diagnosis Date Noted  . Bacterial vaginosis 06/22/2017  . Loss of weight 11/30/2016  . Headache 11/30/2016  . Depression 08/29/2014  . Insomnia 04/21/2013  . ABNORMAL PAP SMEAR, LGSIL 02/01/2007     Objective:  BP 118/80   Pulse 74   Temp 98.2 F (36.8 C) (Oral)   Ht 5\' 4"  (1.626 m)   Wt 161 lb 6.4 oz (73.2 kg)   SpO2 96%   BMI 27.70 kg/m  Vitals and nursing note reviewed  General: NAD, pleasant Cardiac: RRR, normal heart sounds, no murmurs Respiratory: CTAB, normal effort Abdomen: soft, nontender, nondistended. Bowel sounds present GU/GYN: External genitalia within normal limits.  Vaginal mucosa pink, moist, normal rugae.  Nonfriable cervix without lesions, cream yellow-ish discharge noted with visible IUD string, no bleeding noted on speculum exam. Exam performed in the presence of a chaperone. Extremities: no edema or cyanosis. WWP. Skin: warm and dry, no rashes noted Neuro: alert and oriented, no focal deficits  Assessment & Plan:    Bacterial vaginosis confirmed on wet prep. G/C Percell Locus/Chlamydia is pending. Symptoms consistent with this.  - Treatment with  Flagyl 500 BID x 7 days. - F/U if symptoms not improving or getting worse.  - Will f/u on G/C Chlamydia and call in Rx if positive.  - Self care instructions given including avoiding douching. Handout given.  - F/U with PCP as needed.  - Return precautions including abdominal pain, fever, chills, nausea, or vomiting given.    [place] Gorman Safi, DO Family Medicine Resident PGY-1

## 2017-06-24 LAB — CERVICOVAGINAL ANCILLARY ONLY
CHLAMYDIA, DNA PROBE: NEGATIVE
NEISSERIA GONORRHEA: NEGATIVE

## 2017-06-25 ENCOUNTER — Telehealth: Payer: Self-pay | Admitting: Family Medicine

## 2017-06-25 NOTE — Telephone Encounter (Signed)
Patient called and informed of her negative test results for G/C. She is also admitted to severe itching from the clindamycin. Denies any swelling or shortness of breath. Will send in metronidazole gel for treatment of BV instead per patient request.

## 2017-06-26 ENCOUNTER — Other Ambulatory Visit: Payer: Self-pay | Admitting: Family Medicine

## 2017-06-26 MED ORDER — METRONIDAZOLE 0.75 % VA GEL: 1.0000 | Freq: Every day | VAGINAL | 0 refills | Status: AC

## 2017-11-11 ENCOUNTER — Emergency Department (HOSPITAL_COMMUNITY)
Admission: EM | Admit: 2017-11-11 | Discharge: 2017-11-11 | Disposition: A | Discharge status: Home / Self Care | Payer: Managed Care, Other (non HMO) | Attending: Emergency Medicine | Admitting: Emergency Medicine

## 2017-11-11 ENCOUNTER — Emergency Department (HOSPITAL_COMMUNITY): Payer: Managed Care, Other (non HMO)

## 2017-11-11 ENCOUNTER — Encounter (HOSPITAL_COMMUNITY): Payer: Self-pay | Admitting: *Deleted

## 2017-11-11 DIAGNOSIS — M545 Low back pain, unspecified: Secondary | ICD-10-CM

## 2017-11-11 DIAGNOSIS — Z79899 Other long term (current) drug therapy: Secondary | ICD-10-CM | POA: Diagnosis not present

## 2017-11-11 LAB — PREGNANCY, URINE: Preg Test, Ur: NEGATIVE

## 2017-11-11 LAB — URINALYSIS, ROUTINE W REFLEX MICROSCOPIC
Bilirubin Urine: NEGATIVE
Glucose, UA: NEGATIVE mg/dL
Ketones, ur: NEGATIVE mg/dL
Nitrite: NEGATIVE
Protein, ur: NEGATIVE mg/dL
Specific Gravity, Urine: 1.021 (ref 1.005–1.030)
pH: 5 (ref 5.0–8.0)

## 2017-11-11 MED ORDER — METHOCARBAMOL 500 MG PO TABS: 500.0000 mg | ORAL_TABLET | Freq: Two times a day (BID) | ORAL | 0 refills | Status: DC

## 2017-11-11 MED ORDER — KETOROLAC TROMETHAMINE 30 MG/ML IJ SOLN
30.0000 mg | Freq: Once | INTRAMUSCULAR | Status: AC
  Administered 2017-11-11: 30 mg via INTRAMUSCULAR
  Filled 2017-11-11: qty 1

## 2017-11-11 MED ORDER — ACETAMINOPHEN 500 MG PO TABS: 500.0000 mg | ORAL_TABLET | Freq: Four times a day (QID) | ORAL | 0 refills | Status: DC | PRN

## 2017-11-11 MED ORDER — IBUPROFEN 800 MG PO TABS: 800.0000 mg | ORAL_TABLET | Freq: Three times a day (TID) | ORAL | 0 refills | Status: DC

## 2017-11-11 NOTE — Discharge Instructions (Signed)
Medications: Ibuprofen, Tylenol, Robaxin  Treatment: Take ibuprofen every 8 hours with food.  Alternate with Tylenol as prescribed.  Do not take Aleve in addition to ibuprofen.  Take Robaxin twice daily as needed for muscle pain or spasms.  Do not drive or operate machinery while taking this medication.  Use ice and heat alternating 20 minutes on, 20 minutes off.  Attempt the exercises and stretches 1-2 times daily as tolerated.  Follow-up: Please follow-up with your doctor next week if your symptoms are not improving.  Please return to the emergency department if you develop any new or worsening symptoms including fever, severe worsening in pain, complete numbness in your legs or groin, or loss of bowel or bladder control.

## 2017-11-11 NOTE — ED Provider Notes (Addendum)
[facility]-EMERGENCY DEPT Provider Note   CSN: [id number] Arrival date & time: 11/11/17  4098     History   Chief Complaint Chief Complaint  Patient presents with  . Back Pain    HPI Dana Knight is a 42 y.o. female with history of HSV, chlamydia, anxiety who presents with a 3-day history of low back pain.  Her pain is in the middle and radiates to her bilateral buttocks.  She denies any leg pain or numbness or tingling.  She denies any saddle anesthesia or loss of bowel or bladder incontinence.  She denies any injury.  Her pain is worse with movement, sitting, and breathing.  Her pain is sharp.  It occasionally radiates toward her abdomen and bilateral anterior hips when it hurts really bad, but she denies abdominal pain.  She denies any chest pain, shortness of breath, abdominal pain, nausea, vomiting, urinary symptoms.  She has taken ibuprofen and Aleve at home without relief.  She is also tried a heating pad.  She has no history of back pain, surgeries her back, fevers, history of IVDU, known cancer.  HPI  Past Medical History:  Diagnosis Date  . Abnormal Pap smear   . Anxiety   . History of chlamydia   . HSV (herpes simplex virus) anogenital infection   . No pertinent past medical history     Patient Active Problem List   Diagnosis Date Noted  . Bacterial vaginosis 06/22/2017  . Loss of weight 11/30/2016  . Headache 11/30/2016  . Depression 08/29/2014  . Insomnia 04/21/2013  . ABNORMAL PAP SMEAR, LGSIL 02/01/2007    Past Surgical History:  Procedure Laterality Date  . COLPOSCOPY  2010  . DILATION AND CURETTAGE OF UTERUS    . NO PAST SURGERIES       OB History    Gravida  5   Para  3   Term  3   Preterm  0   AB  2   Living  3     SAB  0   TAB  0   Ectopic  0   Multiple  0   Live Births  3            Home Medications    Prior to Admission medications   Medication Sig Start Date End Date Taking? Authorizing  Provider  acetaminophen (TYLENOL) 500 MG tablet Take 1 tablet (500 mg total) by mouth every 6 (six) hours as needed. 11/11/17   Lataunya Ruud, Waylan Boga, PA-C  benzonatate (TESSALON) 200 MG capsule Take 1 capsule (200 mg total) by mouth 3 (three) times daily as needed for cough. 05/18/17   Palma Holter, MD  clindamycin (CLEOCIN) 300 MG capsule Take 1 capsule (300 mg total) by mouth 2 (two) times daily. 06/22/17   Shirley, [place], DO  diphenhydrAMINE (BENADRYL) 25 MG tablet Take 25 mg by mouth at bedtime as needed for allergies.    [provider]  ibuprofen (ADVIL,MOTRIN) 800 MG tablet Take 1 tablet (800 mg total) by mouth 3 (three) times daily. 11/11/17   Camira Geidel, Waylan Boga, PA-C  ipratropium (ATROVENT) 0.06 % nasal spray Place 2 sprays into both nostrils 4 (four) times daily. 05/18/17   Palma Holter, MD  levonorgestrel (MIRENA) 20 MCG/24HR IUD 1 each by Intrauterine route once.    [provider]  meloxicam (MOBIC) 15 MG tablet Take 1 tablet (15 mg total) by mouth daily. 10/16/15   Nani Ravens, MD  methocarbamol Nicholaus Corolla)  500 MG tablet Take 1 tablet (500 mg total) by mouth 2 (two) times daily. 11/11/17   Levaughn Puccinelli, Waylan Boga, PA-C  mirtazapine (REMERON) 15 MG tablet TAKE 1 TABLET BY MOUTH EVERYDAY AT BEDTIME 06/02/17   Palma Holter, MD  Multiple Vitamin (MULTIVITAMIN WITH MINERALS) TABS tablet Take 1 tablet by mouth daily.    [provider]    Family History Family History  Problem Relation Age of Onset  . Stroke Mother   . Diabetes Father   . Thyroid disease Maternal Aunt   . Diabetes Maternal Grandmother     Social History Social History   Tobacco Use  . Smoking status: Never Smoker  . Smokeless tobacco: Never Used  Substance Use Topics  . Alcohol use: Yes    Comment: occasional  . Drug use: No     Allergies   Patient has no known allergies.   Review of Systems Review of Systems  Constitutional: Negative for fever.  Gastrointestinal:  Negative for abdominal pain, nausea and vomiting.  Genitourinary: Negative for dysuria.  Musculoskeletal: Positive for back pain.  Neurological: Negative for numbness.     Physical Exam Updated Vital Signs BP 109/77   Pulse (P) 70   Temp 97.9 F (36.6 C) (Oral)   Resp (P) 18   SpO2 (P) 100%   Physical Exam  Constitutional: She appears well-developed and well-nourished. No distress.  HENT:  Head: Normocephalic and atraumatic.  Mouth/Throat: Oropharynx is clear and moist. No oropharyngeal exudate.  Eyes: Pupils are equal, round, and reactive to light. Conjunctivae are normal. Right eye exhibits no discharge. Left eye exhibits no discharge. No scleral icterus.  Neck: Normal range of motion. Neck supple. No thyromegaly present.  Cardiovascular: Normal rate, regular rhythm, normal heart sounds and intact distal pulses. Exam reveals no gallop and no friction rub.  No murmur heard. Pulmonary/Chest: Effort normal and breath sounds normal. No stridor. No respiratory distress. She has no wheezes. She has no rales.  Abdominal: Soft. Bowel sounds are normal. She exhibits no distension. There is no tenderness. There is no rebound and no guarding.  Musculoskeletal: She exhibits no edema.  Pain on palpation to midline lumbar spine and bilateral paraspinal muscles extending into the bilateral gluteal muscles, pain with heel raise and toe raise, however able.  Pain with flexion, extension, and lateral flexion laterally  Lymphadenopathy:    She has no cervical adenopathy.  Neurological: She is alert. Coordination normal.  5/5 strength and normal sensation to bilateral lower extremities  Skin: Skin is warm and dry. No rash noted. She is not diaphoretic. No pallor.  Psychiatric: She has a normal mood and affect.  Nursing note and vitals reviewed.    ED Treatments / Results  Labs (all labs ordered are listed, but only abnormal results are displayed) Labs Reviewed  URINALYSIS, ROUTINE W REFLEX  MICROSCOPIC - Abnormal; Notable for the following components:      Result Value   Hgb urine dipstick SMALL (*)    Leukocytes, UA MODERATE (*)    Bacteria, UA RARE (*)    All other components within normal limits  URINE CULTURE  PREGNANCY, URINE    EKG None  Radiology Dg Lumbar Spine Complete  Result Date: 11/11/2017 CLINICAL DATA:  Lumbago with lower extremity radicular symptoms EXAM: LUMBAR SPINE - COMPLETE 4+ VIEW COMPARISON:  None. FINDINGS: Frontal, lateral, spot lumbosacral lateral, and bilateral oblique views were obtained. There are 5 non-rib-bearing lumbar type vertebral bodies. There is lumbar dextroscoliosis. There is no  fracture or spondylolisthesis. The disc spaces appear normal. There is no appreciable facet arthropathy. There is an intrauterine device in the mid-pelvis. IMPRESSION: Dextroscoliosis. No fracture or spondylolisthesis. No appreciable arthropathy. There is an intrauterine device in the mid-pelvis. Electronically Signed   By: Bretta Bang III M.D.   On: 11/11/2017 11:14    Procedures Procedures (including critical care time)  Medications Ordered in ED Medications  ketorolac (TORADOL) 30 MG/ML injection 30 mg (30 mg Intramuscular Given 11/11/17 1150)     Initial Impression / Assessment and Plan / ED Course  I have reviewed the triage vital signs and the nursing notes.  Pertinent labs & imaging results that were available during my care of the patient were reviewed by me and considered in my medical decision making (see chart for details).     Patient with back pain suspected to be musculoskeletal.  No neurological deficits and normal neuro exam.  Patient is ambulatory.  No loss of bowel or bladder control.  No concern for cauda equina.  No fever, night sweats, weight loss, h/o cancer, IVDA, no recent procedure to back. No urinary symptoms suggestive of UTI.  Upreg negative. UA shows moderate leukocytes, small hematuria.  Patient states she may be  starting her menstrual cycle.  Will send for culture, but patient has no urinary symptoms, but considering back pain, would treat if positive.  Back pain is not consistent with kidney stone. Supportive care and return precaution discussed. Patient improved in the ED after Toradol.   Will discharge home with ibuprofen and Robaxin. Patient understands and agrees with plan.  Follow-up to PCP in 1 week if not improved.  Return precautions discussed.  Patient understands and agrees with plan.  Patient vitals stable throughout ED course and discharged in satisfactory condition.    Final Clinical Impressions(s) / ED Diagnoses   Final diagnoses:  Acute bilateral low back pain without sciatica    ED Discharge Orders        Ordered    ibuprofen (ADVIL,MOTRIN) 800 MG tablet  3 times daily     11/11/17 1236    acetaminophen (TYLENOL) 500 MG tablet  Every 6 hours PRN     11/11/17 1236    methocarbamol (ROBAXIN) 500 MG tablet  2 times daily     11/11/17 [address], PA-C 11/11/17 1508    Lorre Nick, MD 11/12/17 1733

## 2017-11-11 NOTE — ED Notes (Signed)
Bed: WTR8 Expected date:  Expected time:  Means of arrival:  Comments: 

## 2017-11-11 NOTE — ED Triage Notes (Signed)
Pt complains of lower back pain since Tuesday. Pt has tried ibuprofen, heat, walking w/o any relief.

## 2017-11-12 LAB — URINE CULTURE: Culture: <10000 — AB

## 2018-01-07 ENCOUNTER — Ambulatory Visit (INDEPENDENT_AMBULATORY_CARE_PROVIDER_SITE_OTHER): Payer: Managed Care, Other (non HMO) | Admitting: Family Medicine

## 2018-01-07 VITALS — BP 110/70 | HR 69 | Temp 98.2°F | Wt 166.0 lb

## 2018-01-07 DIAGNOSIS — R0981 Nasal congestion: Secondary | ICD-10-CM | POA: Diagnosis not present

## 2018-01-07 NOTE — Assessment & Plan Note (Signed)
Patient with continued nasal congestion x7 months.  No resolution or improvement of symptoms despite conservative measures of saline nasal spray, over-the-counter allergy and decongestions, and humidifier use unlikely bacterial as patient has tried antibiotics with no improvement.  No improvement with Afrin nasal spray.  Lungs are clear to auscultation and O2 sats are normal.  Nasal septum appears slightly swollen on exam   With some mild tenderness to palpation.  Given chronicity of issue that [facility] will refer to ENT for likely further diagnostic tests and treatment management. -Refer to ENT -Follow-up if no improvement -Continue using over-the-counter allergy medicines/decongestants as well as humidifier use and saline nasal spray

## 2018-01-07 NOTE — Patient Instructions (Signed)
It was a pleasure seeing you today.   Today we discussed your nasal congestion  For your congestion: continue to use over the counter allergy medication daily and saline nasal spray as well as using your humidifier. I have referred you to ENT to help get a more in depth work up.   If you do not hear back in 2 weeks about this appointment please call our clinic to discuss the referral.   Please follow up if no improvement or sooner if symptoms persist or worsen. Please call the clinic immediately if you have any concerns.   Our clinic's number is [phone]. Please call with questions or concerns.   Please go to the emergency room if you have difficulty breathing  Thank you,  Oralia ManisSherin Yeray Tomas, DO

## 2018-01-07 NOTE — Progress Notes (Signed)
   Subjective:    Patient ID: Dana Knight, female    DOB: Oct 22, 1975, 42 y.o.   MRN: [id number]   CC: nasal congestion  HPI: Nasal congestion Patient presenting today for continued nasal congestion that began in December.  Patient says that it occurs every day throughout the day.  Says she can go to sleep with nasal congestion and wake up with the same nasal congestion.  Says that it is difficult to breathe out of her nose.  S[facility] that [facility] at which time she was recommended to take amoxicillin and Atrovent nasal spray but did not help. Patient says she has tried saline nasal spray, over-the-counter allergy medications and decongestants, a humidifier, and changing the air filters in her room without any relief of symptoms.  Patient says it gets to the point where she has difficulty sleeping and snores throughout the night which is now disturbing her partner.  Her sister also comes in to check on her in the middle the night because her snoring is not loud.  Patient denies any fevers or chills.  Does report some sweating at night but it does not soak through her clothes and sheets.  Patient does sometimes spit out green mucus that is light.  Patient is concerned as the symptoms have not improved for the past 7 months.   Objective:  BP 110/70   Pulse 69   Temp 98.2 F (36.8 C)   Wt 166 lb (75.3 kg)   SpO2 96%   BMI 28.49 kg/m  Vitals and nursing note reviewed  General: well nourished, in no acute distress HEENT: normocephalic, TM's visualized bilaterally, no tenderness to palpation of frontal, maxillary sinuses.  No scleral icterus or conjunctival pallor, pupils equal round reactive to light,  no nasal discharge, some nasal septum swelling, tenderness to palpation of nasal septum moist mucous membranes, good dentition without erythema or discharge noted in posterior oropharynx, tonsillar exudates Neck: supple, non-tender, some cervical lymphadenopathy but is  nontender Cardiac: RRR, clear S1 and S2, no murmurs, rubs, or gallops Respiratory: clear to auscultation bilaterally, no increased work of breathing Abdomen: soft, nontender, nondistended, no masses or organomegaly. Bowel sounds present Extremities: no edema or cyanosis. Warm, well perfused.  Skin: warm and dry, no rashes noted Neuro: alert and oriented, no focal deficits   Assessment & Plan:    Nasal congestion Patient with continued nasal congestion x7 months.  No resolution or improvement of symptoms despite conservative measures of saline nasal spray, over-the-counter allergy and decongestions, and humidifier use unlikely bacterial as patient has tried antibiotics with no improvement.  No improvement with Afrin nasal spray.  Lungs are clear to auscultation and O2 sats are normal.  Nasal septum appears slightly swollen on exam   With some mild tenderness to palpation.  Given chronicity of issue that [facility] will refer to ENT for likely further diagnostic tests and treatment management. -Refer to ENT -Follow-up if no improvement -Continue using over-the-counter allergy medicines/decongestants as well as humidifier use and saline nasal spray    Return if symptoms worsen or fail to improve.   Oralia ManisSherin Josanne Boerema, DO, PGY-2

## 2018-02-05 ENCOUNTER — Encounter (HOSPITAL_COMMUNITY): Payer: Self-pay | Admitting: Emergency Medicine

## 2018-02-05 ENCOUNTER — Emergency Department (HOSPITAL_COMMUNITY)
Admission: EM | Admit: 2018-02-05 | Discharge: 2018-02-06 | Disposition: A | Discharge status: Home / Self Care | Payer: Managed Care, Other (non HMO) | Attending: Emergency Medicine | Admitting: Emergency Medicine

## 2018-02-05 ENCOUNTER — Emergency Department (HOSPITAL_COMMUNITY): Payer: Managed Care, Other (non HMO)

## 2018-02-05 DIAGNOSIS — R319 Hematuria, unspecified: Secondary | ICD-10-CM

## 2018-02-05 DIAGNOSIS — Z79899 Other long term (current) drug therapy: Secondary | ICD-10-CM | POA: Insufficient documentation

## 2018-02-05 DIAGNOSIS — R109 Unspecified abdominal pain: Secondary | ICD-10-CM | POA: Diagnosis not present

## 2018-02-05 DIAGNOSIS — K625 Hemorrhage of anus and rectum: Secondary | ICD-10-CM | POA: Diagnosis not present

## 2018-02-05 LAB — CBC
HEMATOCRIT: 41.2 % (ref 36.0–46.0)
Hemoglobin: 13.6 g/dL (ref 12.0–15.0)
MCH: 31.4 pg (ref 26.0–34.0)
MCHC: 33 g/dL (ref 30.0–36.0)
MCV: 95.2 fL (ref 78.0–100.0)
PLATELETS: 363 10*3/uL (ref 150–400)
RBC: 4.33 MIL/uL (ref 3.87–5.11)
RDW: 12.3 % (ref 11.5–15.5)
WBC: 6.9 10*3/uL (ref 4.0–10.5)

## 2018-02-05 LAB — COMPREHENSIVE METABOLIC PANEL
ALT: 14 U/L (ref 0–44)
AST: 17 U/L (ref 15–41)
Albumin: 3.9 g/dL (ref 3.5–5.0)
Alkaline Phosphatase: 39 U/L (ref 38–126)
Anion gap: 9 (ref 5–15)
BILIRUBIN TOTAL: 1.3 mg/dL — AB (ref 0.3–1.2)
BUN: 9 mg/dL (ref 6–20)
CO2: 25 mmol/L (ref 22–32)
CREATININE: 0.68 mg/dL (ref 0.44–1.00)
Calcium: 8.9 mg/dL (ref 8.9–10.3)
Chloride: 106 mmol/L (ref 98–111)
Glucose, Bld: 100 mg/dL — ABNORMAL HIGH (ref 70–99)
POTASSIUM: 3.7 mmol/L (ref 3.5–5.1)
Sodium: 140 mmol/L (ref 135–145)
TOTAL PROTEIN: 7.1 g/dL (ref 6.5–8.1)

## 2018-02-05 LAB — URINALYSIS, ROUTINE W REFLEX MICROSCOPIC
Bacteria, UA: NONE SEEN
Bilirubin Urine: NEGATIVE
GLUCOSE, UA: NEGATIVE mg/dL
Ketones, ur: NEGATIVE mg/dL
Leukocytes, UA: NEGATIVE
Nitrite: NEGATIVE
Protein, ur: 30 mg/dL — AB
RBC / HPF: >50 RBC/hpf — ABNORMAL HIGH (ref 0–5)
SPECIFIC GRAVITY, URINE: 1.013 (ref 1.005–1.030)
pH: 7 (ref 5.0–8.0)

## 2018-02-05 LAB — POC URINE PREG, ED: PREG TEST UR: NEGATIVE

## 2018-02-05 LAB — POC OCCULT BLOOD, ED: Fecal Occult Bld: NEGATIVE

## 2018-02-05 MED ORDER — IOPAMIDOL (ISOVUE-300) INJECTION 61%
100.0000 mL | Freq: Once | INTRAVENOUS | Status: AC | PRN
  Administered 2018-02-06: 100 mL via INTRAVENOUS

## 2018-02-05 MED ORDER — CEPHALEXIN 500 MG PO CAPS
500.0000 mg | ORAL_CAPSULE | Freq: Once | ORAL | Status: AC
  Administered 2018-02-05: 500 mg via ORAL
  Filled 2018-02-05: qty 1

## 2018-02-05 MED ORDER — CEPHALEXIN 500 MG PO CAPS: 500.0000 mg | ORAL_CAPSULE | Freq: Four times a day (QID) | ORAL | 0 refills | Status: DC

## 2018-02-05 MED ORDER — SODIUM CHLORIDE 0.9 % IV SOLN
INTRAVENOUS | Status: DC
  Administered 2018-02-05: 23:00:00 via INTRAVENOUS

## 2018-02-05 NOTE — ED Triage Notes (Signed)
Patient here from home with complaints of rectal bleeding that started today. Hx of hemorrids only when pregnant. Denies abdominal pain.

## 2018-02-05 NOTE — ED Provider Notes (Signed)
[facility]-EMERGENCY DEPT Provider Note   CSN: [id number] Arrival date & time: 02/05/18  1707     History   Chief Complaint Chief Complaint  Patient presents with  . Rectal Bleeding    HPI Dana Knight is a 42 y.o. female.  Patient presents with a complaint of rectal bleeding that started today.  Patient noticed red blood in the commode water.  When questioned if it was possible urine she says now her urine is been dark but is not been bloody.  Associated with some abdominal cramping.  Patient states she has had a history of hemorrhoids in the past and was wondering if that was where the bleeding was coming from.  No nausea vomiting no diarrhea.  No dysuria.  No flank pain.  No back pain.     Past Medical History:  Diagnosis Date  . Abnormal Pap smear   . Anxiety   . History of chlamydia   . HSV (herpes simplex virus) anogenital infection   . No pertinent past medical history     Patient Active Problem List   Diagnosis Date Noted  . Nasal congestion 01/07/2018  . Bacterial vaginosis 06/22/2017  . Loss of weight 11/30/2016  . Headache 11/30/2016  . Depression 08/29/2014  . Insomnia 04/21/2013  . ABNORMAL PAP SMEAR, LGSIL 02/01/2007    Past Surgical History:  Procedure Laterality Date  . COLPOSCOPY  2010  . DILATION AND CURETTAGE OF UTERUS    . NO PAST SURGERIES       OB History    Gravida  5   Para  3   Term  3   Preterm  0   AB  2   Living  3     SAB  0   TAB  0   Ectopic  0   Multiple  0   Live Births  3            Home Medications    Prior to Admission medications   Medication Sig Start Date End Date Taking? Authorizing Provider  acetaminophen (TYLENOL) 500 MG tablet Take 1 tablet (500 mg total) by mouth every 6 (six) hours as needed. 11/11/17   Law, Waylan Boga, PA-C  benzonatate (TESSALON) 200 MG capsule Take 1 capsule (200 mg total) by mouth 3 (three) times daily as needed for cough. 05/18/17   Palma Holter, MD  clindamycin (CLEOCIN) 300 MG capsule Take 1 capsule (300 mg total) by mouth 2 (two) times daily. 06/22/17   Shirley, [place], DO  diphenhydrAMINE (BENADRYL) 25 MG tablet Take 25 mg by mouth at bedtime as needed for allergies.    [provider]  ibuprofen (ADVIL,MOTRIN) 800 MG tablet Take 1 tablet (800 mg total) by mouth 3 (three) times daily. 11/11/17   Law, Waylan Boga, PA-C  ipratropium (ATROVENT) 0.06 % nasal spray Place 2 sprays into both nostrils 4 (four) times daily. 05/18/17   Palma Holter, MD  levonorgestrel (MIRENA) 20 MCG/24HR IUD 1 each by Intrauterine route once.    [provider]  meloxicam (MOBIC) 15 MG tablet Take 1 tablet (15 mg total) by mouth daily. 10/16/15   Nani Ravens, MD  methocarbamol (ROBAXIN) 500 MG tablet Take 1 tablet (500 mg total) by mouth 2 (two) times daily. 11/11/17   Law, Waylan Boga, PA-C  mirtazapine (REMERON) 15 MG tablet TAKE 1 TABLET BY MOUTH EVERYDAY AT BEDTIME 06/02/17   Palma Holter, MD  Multiple Vitamin (MULTIVITAMIN WITH  MINERALS) TABS tablet Take 1 tablet by mouth daily.    [provider]    Family History Family History  Problem Relation Age of Onset  . Stroke Mother   . Diabetes Father   . Thyroid disease Maternal Aunt   . Diabetes Maternal Grandmother     Social History Social History   Tobacco Use  . Smoking status: Never Smoker  . Smokeless tobacco: Never Used  Substance Use Topics  . Alcohol use: Yes    Comment: occasional  . Drug use: No     Allergies   Patient has no known allergies.   Review of Systems Review of Systems  Constitutional: Negative for fever.  HENT: Negative for congestion.   Eyes: Negative for visual disturbance.  Respiratory: Negative for shortness of breath.   Cardiovascular: Negative for chest pain.  Gastrointestinal: Positive for abdominal pain and blood in stool. Negative for diarrhea, nausea and vomiting.  Genitourinary: Negative for  dysuria and hematuria.  Musculoskeletal: Negative for back pain.  Skin: Negative for rash.  Neurological: Negative for headaches.  Hematological: Does not bruise/bleed easily.  Psychiatric/Behavioral: Negative for confusion.     Physical Exam Updated Vital Signs BP 129/90 (BP Location: Left Arm)   Pulse 70   Temp 98.3 F (36.8 C) (Oral)   Resp 14   SpO2 100%   Physical Exam  Constitutional: She is oriented to person, place, and time. She appears well-developed and well-nourished. No distress.  HENT:  Head: Normocephalic and atraumatic.  Mouth/Throat: Oropharynx is clear and moist.  Eyes: Pupils are equal, round, and reactive to light. Conjunctivae and EOM are normal.  Neck: Neck supple.  Cardiovascular: Normal rate, regular rhythm and normal heart sounds.  Pulmonary/Chest: Effort normal and breath sounds normal. No respiratory distress.  Abdominal: Soft. Bowel sounds are normal. There is no tenderness.  Genitourinary: Rectal exam shows guaiac negative stool.  Genitourinary Comments: Rectal exam with some external skin tags.  No prolapsed internal hemorrhoids.  Stool Hemoccult negative.  No evidence of any gross blood obviously.  No palpable masses.  Also no evidence of any blood at the vaginal introitus.  Musculoskeletal: Normal range of motion. She exhibits no edema.  Neurological: She is alert and oriented to person, place, and time. No cranial nerve deficit or sensory deficit. She exhibits normal muscle tone. Coordination normal.  Skin: Skin is warm.  Nursing note and vitals reviewed.    ED Treatments / Results  Labs (all labs ordered are listed, but only abnormal results are displayed) Labs Reviewed  COMPREHENSIVE METABOLIC PANEL - Abnormal; Notable for the following components:      Result Value   Glucose, Bld 100 (*)    Total Bilirubin 1.3 (*)    All other components within normal limits  URINALYSIS, ROUTINE W REFLEX MICROSCOPIC - Abnormal; Notable for the  following components:   APPearance HAZY (*)    Hgb urine dipstick LARGE (*)    Protein, ur 30 (*)    RBC / HPF >50 (*)    All other components within normal limits  URINE CULTURE  CBC  POC OCCULT BLOOD, ED  POC URINE PREG, ED  POC OCCULT BLOOD, ED    EKG None  Radiology No results found.  Procedures Procedures (including critical care time)  Medications Ordered in ED Medications  0.9 %  sodium chloride infusion ( Intravenous New Bag/Given 02/05/18 2240)  cephALEXin (KEFLEX) capsule 500 mg (has no administration in time range)     Initial Impression /  Assessment and Plan / ED Course  I have reviewed the triage vital signs and the nursing notes.  Pertinent labs & imaging results that were available during my care of the patient were reviewed by me and considered in my medical decision making (see chart for details).     Patient felt as if she was having rectal bleeding.  Was fairly convinced it was not the urine.  Here her urine has significant amount of red blood cells without evidence of infection.  Urine sent for culture.  We started on Keflex.  And will be continued on that for 7 days.  CT abdomen will be done because patient had some abdominal cramping.  Just to evaluate intra-abdominal organs in more detail.  But clinically I am now believing that this is probably all hematuria and will be painless hematuria.  [facility].  We have her call family medicine for follow-up.  We will also give her urology follow-up.  Patient's renal function is normal hemoglobin hematocrit is normal no evidence of any significant blood loss.  Vital signs are normal no tachycardia no hypotension.  Final Clinical Impressions(s) / ED Diagnoses   Final diagnoses:  Hematuria, unspecified type    ED Discharge Orders    None       Vanetta MuldersZackowski, Allah Reason, MD 02/05/18 2334

## 2018-02-05 NOTE — Discharge Instructions (Signed)
Take the antibiotic Keflex as directed for the next 7 days.  On Monday call family medicine for follow-up appointment towards the end of the week.  Also call urology for follow-up if the hematuria does not clear with the antibiotics.  Return for any new or worse symptoms.

## 2018-02-06 DIAGNOSIS — K625 Hemorrhage of anus and rectum: Secondary | ICD-10-CM | POA: Diagnosis not present

## 2018-02-06 MED ORDER — IOPAMIDOL (ISOVUE-300) INJECTION 61%
INTRAVENOUS | Status: AC
  Filled 2018-02-06: qty 100

## 2018-02-06 MED ORDER — IOHEXOL 300 MG/ML  SOLN
30.0000 mL | Freq: Once | INTRAMUSCULAR | Status: AC | PRN
  Administered 2018-02-06: 30 mL via ORAL

## 2018-02-06 NOTE — ED Provider Notes (Signed)
Care assumed from Dr. Deretha EmoryZackowski, patient pending CT of abdomen and pelvis.  Presented with concern for rectal bleeding, appears to be hematuria.  Anticipate referral back to primary care provider if CT is negative.  1:17 AM CT scan shows no acute process.  She is discharged with prescription for cephalexin to treat possible urinary tract infection, referred back to her primary care provider for further outpatient evaluation.  Results for orders placed or performed during the hospital encounter of 02/05/18  Comprehensive metabolic panel  Result Value Ref Range   Sodium 140 135 - 145 mmol/L   Potassium 3.7 3.5 - 5.1 mmol/L   Chloride 106 98 - 111 mmol/L   CO2 25 22 - 32 mmol/L   Glucose, Bld 100 (H) 70 - 99 mg/dL   BUN 9 6 - 20 mg/dL   Creatinine, Ser 1.190.68 0.44 - 1.00 mg/dL   Calcium 8.9 8.9 - 14.710.3 mg/dL   Total Protein 7.1 6.5 - 8.1 g/dL   Albumin 3.9 3.5 - 5.0 g/dL   AST 17 15 - 41 U/L   ALT 14 0 - 44 U/L   Alkaline Phosphatase 39 38 - 126 U/L   Total Bilirubin 1.3 (H) 0.3 - 1.2 mg/dL   GFR calc non Af Amer >60 >60 mL/min   GFR calc Af Amer >60 >60 mL/min   Anion gap 9 5 - 15  CBC  Result Value Ref Range   WBC 6.9 4.0 - 10.5 K/uL   RBC 4.33 3.87 - 5.11 MIL/uL   Hemoglobin 13.6 12.0 - 15.0 g/dL   HCT 82.941.2 56.236.0 - 13.046.0 %   MCV 95.2 78.0 - 100.0 fL   MCH 31.4 26.0 - 34.0 pg   MCHC 33.0 30.0 - 36.0 g/dL   RDW 86.512.3 78.411.5 - 69.615.5 %   Platelets 363 150 - 400 K/uL  Urinalysis, Routine w reflex microscopic  Result Value Ref Range   Color, Urine YELLOW YELLOW   APPearance HAZY (A) CLEAR   Specific Gravity, Urine 1.013 1.005 - 1.030   pH 7.0 5.0 - 8.0   Glucose, UA NEGATIVE NEGATIVE mg/dL   Hgb urine dipstick LARGE (A) NEGATIVE   Bilirubin Urine NEGATIVE NEGATIVE   Ketones, ur NEGATIVE NEGATIVE mg/dL   Protein, ur 30 (A) NEGATIVE mg/dL   Nitrite NEGATIVE NEGATIVE   Leukocytes, UA NEGATIVE NEGATIVE   RBC / HPF >50 (H) 0 - 5 RBC/hpf   Bacteria, UA NONE SEEN NONE SEEN   Squamous  Epithelial / LPF 6-10 0 - 5   Mucus PRESENT   POC occult blood, ED  Result Value Ref Range   Fecal Occult Bld NEGATIVE NEGATIVE  POC Urine Pregnancy, ED (do NOT order at [facility])  Result Value Ref Range   Preg Test, Ur NEGATIVE NEGATIVE   Ct Abdomen Pelvis W Contrast  Result Date: 02/06/2018 CLINICAL DATA:  Rectal bleeding. Abd pain, gastroenteritis or colitis suspected EXAM: CT ABDOMEN AND PELVIS WITH CONTRAST TECHNIQUE: Multidetector CT imaging of the abdomen and pelvis was performed using the standard protocol following bolus administration of intravenous contrast. CONTRAST:  100mL ISOVUE-300 IOPAMIDOL (ISOVUE-300) INJECTION 61%, 30mL OMNIPAQUE IOHEXOL 300 MG/ML SOLN COMPARISON:  None. FINDINGS: Lower chest: Lung bases are clear. Hepatobiliary: No focal liver abnormality is seen. No gallstones, gallbladder wall thickening, or biliary dilatation. Pancreas: No ductal dilatation or inflammation. Spleen: Normal in size without focal abnormality. Adrenals/Urinary Tract: Normal adrenal glands. No hydronephrosis or perinephric edema. Homogeneous renal enhancement with symmetric excretion on delayed phase imaging. Urinary bladder  is nondistended and not well evaluated. Stomach/Bowel: Stomach physiologically distended without gastric wall thickening. No small bowel inflammation, wall thickening, or obstruction. Terminal ileum is normal. The appendix is normal. Small to moderate colonic stool burden without colonic wall thickening or inflammatory change. No significant diverticular disease. Vascular/Lymphatic: No significant vascular findings are present. No enlarged abdominal or pelvic lymph nodes. Reproductive: IUD appropriately positioned in the uterus. Small physiologic corpus luteal cyst in the left ovary. Right ovary is normal. Small volume pelvic free fluid typically physiologic. Other: No free air or intra-abdominal abscess.  No ascites. Musculoskeletal: There are no acute or suspicious osseous  abnormalities. IMPRESSION: No acute abnormality or explanation for GI bleed. Electronically Signed   By: Rubye Oaks M.D.   On: 02/06/2018 00:55      Dione Booze, MD 02/06/18 [phone]

## 2018-02-07 ENCOUNTER — Ambulatory Visit: Payer: Managed Care, Other (non HMO) | Admitting: Family Medicine

## 2018-02-07 ENCOUNTER — Other Ambulatory Visit: Payer: Self-pay

## 2018-02-07 VITALS — BP 120/78 | HR 90 | Temp 98.4°F | Wt 164.8 lb

## 2018-02-07 DIAGNOSIS — N3001 Acute cystitis with hematuria: Secondary | ICD-10-CM | POA: Diagnosis not present

## 2018-02-07 DIAGNOSIS — R300 Dysuria: Secondary | ICD-10-CM | POA: Diagnosis not present

## 2018-02-07 NOTE — Patient Instructions (Addendum)
Thank you for coming in to see us today. Please see below to review our plan for today's visit.  You do have a urinary infection based on your symptoms and blood in your urine.  Continue taking the Keflex for the remainder of the 7 days.  Your symptoms should improve over the next few days.  If you find that your symptoms are not improving, please return to the clinic.  Seek immediate medical attention if you develop fevers or chills, nausea or vomiting, flank pain in your lower back.  These are signs that your infection is spread to your kidneys and will need to be treated more aggressively.  I would like you to follow-up in 2 weeks to be sure that blood in your urine has resolved.  Please call the clinic at [phone] if your symptoms worsen or you have any concerns. It was our pleasure to serve you.  Durward Parcelavid Snyder Colavito, DO [facility], PGY-3

## 2018-02-07 NOTE — Progress Notes (Signed)
   Subjective   Patient ID: Dana Knight    DOB: 16-Sep-1975, 42 y.o. female   MRN: [id number]  CC: "Blood in urine"  HPI: Dana Knight is a 42 y.o. female who presents to clinic today for the following:  DYSURIA  Pain urinating started 2 days ago. Pain is: burning at urethra Medications tried: Keflex, 4 doses so far  Any antibiotics in the last 30 days: no other antibiotics More than 3 UTIs in the last 12 months: none STD exposure: unsure, does not think so Possibly pregnant: no, has Dana AdjutantMarina Knight  Symptoms Urgency: yes Frequency: yes Blood in urine: yes, gross Pain in back: no Fever: no Vaginal discharge: some the other day Mouth Ulcers: no  Of note, patient was [facility] and [facility] days prior for hematuria thought to be related to urinary infection.  She did undergo abdominal CT without any findings.  Urine culture was significant for Gardnerella vaginosis.  She did have gross hematuria on exam and was given Keflex with instructions to use 4 times daily for 7 days.  Patient reports starting her medication yesterday evening.  She has increased her fluid intake has no prior history of urinary infections.  ROS: see HPI for pertinent.  PMFSH: History of LGSIL, HSV, chlamydia.  Surgical history D&C.  Family history stroke, DM.  Smoking status reviewed. Medications reviewed.  Objective   BP 120/78   Pulse 90   Temp 98.4 F (36.9 C) (Oral)   Wt 164 lb 12.8 oz (74.8 kg)   SpO2 98%   BMI 28.29 kg/m  Vitals and nursing note reviewed.  General: well nourished, well developed, NAD with non-toxic appearance HEENT: normocephalic, atraumatic, moist mucous membranes Cardiovascular: regular rate and rhythm without murmurs, rubs, or gallops Lungs: clear to auscultation bilaterally with normal work of breathing Abdomen: soft, non-tender, non-distended, normoactive bowel sounds GU: Moderate suprapubic tenderness, gross blood in urine sample, absent costovertebral  tenderness Skin: warm, dry, no rashes or lesions, cap refill < 2 seconds Extremities: warm and well perfused, normal tone, no edema  Assessment & Plan   Acute cystitis with hematuria Acute.  Consistent with simple acute cystitis.  No signs of kidney involvement.  Patient without constitutional symptoms.  Culture in ED consistent with bacterial vaginosis.  Unable to perform urinalysis on today's sample given gross hematuria. - Reviewed return precautions - Instructed patient to complete 7-day treatment of Keflex 500 mg 4 times daily - Advised patient to increase fluid intake - Checking urine culture - Follow-up in 2 weeks for resolution of hematuria or sooner if needed  Orders Placed This Encounter  Procedures  . POCT urinalysis dipstick   No orders of the defined types were placed in this encounter.   Durward Parcelavid Danniela Mcbrearty, DO [facility], PGY-3 02/07/2018, 3:58 PM

## 2018-02-07 NOTE — Assessment & Plan Note (Signed)
Acute.  Consistent with simple acute cystitis.  No signs of kidney involvement.  Patient without constitutional symptoms.  Culture in ED consistent with bacterial vaginosis.  Unable to perform urinalysis on today's sample given gross hematuria. - Reviewed return precautions - Instructed patient to complete 7-day treatment of Keflex 500 mg 4 times daily - Advised patient to increase fluid intake - Checking urine culture - Follow-up in 2 weeks for resolution of hematuria or sooner if needed

## 2018-02-08 LAB — URINE CULTURE

## 2018-02-09 ENCOUNTER — Telehealth: Payer: Self-pay | Admitting: Emergency Medicine

## 2018-02-09 ENCOUNTER — Encounter: Payer: Self-pay | Admitting: Family Medicine

## 2018-02-09 LAB — URINE CULTURE: Organism ID, Bacteria: NO GROWTH

## 2018-02-09 NOTE — Telephone Encounter (Signed)
Post ED Visit - Positive Culture Follow-up: Successful Patient Follow-Up  Culture assessed and recommendations reviewed by:  []  Enzo BiNathan Batchelder, Pharm.D. []  Celedonio MiyamotoJeremy Frens, Pharm.D., BCPS AQ-ID []  Garvin FilaMike Maccia, Pharm.D., BCPS []  Georgina PillionElizabeth Martin, Pharm.D., BCPS []  RichmondMinh Pham, [address]Pharm.D., BCPS, AAHIVP []  Estella HuskMichelle Turner, Pharm.D., BCPS, AAHIVP [x]  Lysle Pearlachel Rumbarger, PharmD, BCPS []  Phillips Climeshuy Dang, PharmD, BCPS []  Agapito GamesAlison Masters, PharmD, BCPS []  Verlan FriendsErin Deja, PharmD  Positive urine culture  []  Patient discharged without antimicrobial prescription and treatment is now indicated [x]  Organism is resistant to prescribed ED discharge antimicrobial []  Patient with positive blood cultures  Changes discussed with ED provider: Army MeliaLaura Murphy PA New antibiotic prescription d/c cephalexin, start flagyl 500mg  po bid x 7 days Called to [facility] [address] Contacted patient 02/09/2018 0953   [name], Dana Knight 02/09/2018, 9:52 AM

## 2018-02-09 NOTE — Progress Notes (Signed)
ED Antimicrobial Stewardship Positive Culture Follow Up   Dana Knight is an 42 y.o. female who [facility] to [facility] with a chief complaint of  Chief Complaint  Patient presents with  . Rectal Bleeding    Recent Results (from the past 720 hour(s))  Urine Culture     Status: Abnormal   Collection Time: 02/05/18 11:28 PM  Result Value Ref Range Status   Specimen Description   Final    URINE, RANDOM Performed at [facility], [address] [place], [place] [address]    Special Requests   Final    NONE Performed at [facility], [address] [place], [place] [address]    Culture (A)  Final    >=100,000 COLONIES/mL GARDNERELLA VAGINALIS BETA LACTAMASE NEGATIVE Performed at [facility], [address] [place], [place] [address]    Report Status 02/08/2018 FINAL  Final  Urine Culture     Status: None   Collection Time: 02/07/18  4:11 PM  Result Value Ref Range Status   Urine Culture, Routine Final report  Final   Organism ID, Bacteria No growth  Final    [x]  Treated with cephalexin, organism resistant to prescribed antimicrobial []  Patient discharged originally without antimicrobial agent and treatment is now indicated  New antibiotic prescription: DC cephalexin, start flagyl 500mg  PO BID x 7 days. Will send message to MD about checking for STD's if further symptoms  ED Provider: Army MeliaLaura Murphy, PA   Dana Knight, Drake LeachRachel Lynn 02/09/2018, 9:06 AM Clinical Pharmacist Monday - Friday phone -  [phone] Saturday - Sunday phone - [phone]

## 2018-02-10 ENCOUNTER — Encounter (HOSPITAL_COMMUNITY): Payer: Self-pay | Admitting: Emergency Medicine

## 2018-02-10 ENCOUNTER — Emergency Department (HOSPITAL_COMMUNITY)
Admission: EM | Admit: 2018-02-10 | Discharge: 2018-02-11 | Disposition: A | Discharge status: Home / Self Care | Payer: Managed Care, Other (non HMO) | Attending: Emergency Medicine | Admitting: Emergency Medicine

## 2018-02-10 DIAGNOSIS — R319 Hematuria, unspecified: Secondary | ICD-10-CM | POA: Insufficient documentation

## 2018-02-10 DIAGNOSIS — Z79899 Other long term (current) drug therapy: Secondary | ICD-10-CM | POA: Diagnosis not present

## 2018-02-10 DIAGNOSIS — R102 Pelvic and perineal pain: Secondary | ICD-10-CM | POA: Diagnosis not present

## 2018-02-10 DIAGNOSIS — N390 Urinary tract infection, site not specified: Secondary | ICD-10-CM | POA: Insufficient documentation

## 2018-02-10 LAB — COMPREHENSIVE METABOLIC PANEL
ALBUMIN: 4.2 g/dL (ref 3.5–5.0)
ALT: 15 U/L (ref 0–44)
ANION GAP: 7 (ref 5–15)
AST: 16 U/L (ref 15–41)
Alkaline Phosphatase: 39 U/L (ref 38–126)
BUN: 6 mg/dL (ref 6–20)
CHLORIDE: 106 mmol/L (ref 98–111)
CO2: 28 mmol/L (ref 22–32)
Calcium: 9.7 mg/dL (ref 8.9–10.3)
Creatinine, Ser: 0.92 mg/dL (ref 0.44–1.00)
GFR calc Af Amer: >60 mL/min (ref >60)
GFR calc non Af Amer: >60 mL/min (ref >60)
GLUCOSE: 99 mg/dL (ref 70–99)
Potassium: 3.3 mmol/L — ABNORMAL LOW (ref 3.5–5.1)
SODIUM: 141 mmol/L (ref 135–145)
Total Bilirubin: 1.9 mg/dL — ABNORMAL HIGH (ref 0.3–1.2)
Total Protein: 7.1 g/dL (ref 6.5–8.1)

## 2018-02-10 LAB — URINALYSIS, ROUTINE W REFLEX MICROSCOPIC
BILIRUBIN URINE: NEGATIVE
Glucose, UA: NEGATIVE mg/dL
Ketones, ur: NEGATIVE mg/dL
LEUKOCYTES UA: NEGATIVE
NITRITE: NEGATIVE
Protein, ur: >300 mg/dL — AB
pH: 6.5 (ref 5.0–8.0)

## 2018-02-10 LAB — URINALYSIS, MICROSCOPIC (REFLEX)
RBC / HPF: >50 RBC/hpf (ref 0–5)
WBC UA: NONE SEEN WBC/hpf (ref 0–5)

## 2018-02-10 LAB — CBC WITH DIFFERENTIAL/PLATELET
ABS IMMATURE GRANULOCYTES: 0 10*3/uL (ref 0.0–0.1)
BASOS PCT: 1 %
Basophils Absolute: 0.1 10*3/uL (ref 0.0–0.1)
Eosinophils Absolute: 0.1 10*3/uL (ref 0.0–0.7)
Eosinophils Relative: 2 %
HEMATOCRIT: 38.4 % (ref 36.0–46.0)
HEMOGLOBIN: 12.3 g/dL (ref 12.0–15.0)
IMMATURE GRANULOCYTES: 0 %
LYMPHS ABS: 2.6 10*3/uL (ref 0.7–4.0)
LYMPHS PCT: 46 %
MCH: 31.1 pg (ref 26.0–34.0)
MCHC: 32 g/dL (ref 30.0–36.0)
MCV: 97 fL (ref 78.0–100.0)
MONO ABS: 0.6 10*3/uL (ref 0.1–1.0)
MONOS PCT: 10 %
NEUTROS ABS: 2.4 10*3/uL (ref 1.7–7.7)
Neutrophils Relative %: 41 %
PLATELETS: 324 10*3/uL (ref 150–400)
RBC: 3.96 MIL/uL (ref 3.87–5.11)
RDW: 11.9 % (ref 11.5–15.5)
WBC: 5.8 10*3/uL (ref 4.0–10.5)

## 2018-02-10 LAB — I-STAT BETA HCG BLOOD, ED (MC, WL, AP ONLY): I-stat hCG, quantitative: <5 m[IU]/mL (ref <5)

## 2018-02-10 MED ORDER — OXYCODONE-ACETAMINOPHEN 5-325 MG PO TABS
1.0000 | ORAL_TABLET | Freq: Once | ORAL | Status: AC
  Administered 2018-02-10: 1 via ORAL
  Filled 2018-02-10: qty 1

## 2018-02-10 NOTE — ED Provider Notes (Signed)
Patient placed in Quick Look pathway, seen and evaluated   Chief Complaint: hematuria  HPI:  Dana Knight is a 42 y.o. female who presents to the ED with hematuria.Patient was evaluated in the ED on 8/24 for hematuria and had a complete work up with CT of abdomen and labs. Patient had f/u with her PCP 8/26 and dx with cystitis. Patient was started on Keflex at ED visitbut called yesterday and told she would need to change to Clindamycin due to culture results. LMP: patient reports she does not usually bleed to having IUD for birth control. Patient reports severe pelvic pain  With radiation to the back.   ROS: GI: abdominal pain  GU: hematuria  Physical Exam:  BP (!) 143/94 (BP Location: Right Arm)   Pulse 86   Temp 98.9 F (37.2 C) (Oral)   Resp 18   SpO2 99%    Gen: No distress appears uncomfortable  Neuro: Awake and Alert  Skin: Warm and dry      Initiation of care has begun. The patient has been counseled on the process, plan, and necessity for staying for the completion/evaluation, and the remainder of the medical screening examination    Janne Napoleoneese, Hope M, NP 02/10/18 2102    Terrilee FilesButler, Michael C, MD 02/11/18 [phone]

## 2018-02-10 NOTE — ED Notes (Signed)
C/o pains and spasms in her vagina. Pain sore 8 and half out of 10.

## 2018-02-10 NOTE — ED Provider Notes (Signed)
[facility] EMERGENCY DEPARTMENT Provider Note   CSN: [id number] Arrival date & time: 02/10/18  2028     History   Chief Complaint Chief Complaint  Patient presents with  . Hematuria  . Pelvic Pain    HPI Dana Knight is a 42 y.o. female.  HPI   She presents for evaluation of ongoing suprapubic pain present for about a week.  She has been treated for UTI initially with Keflex, which was changed to Flagyl because her urinary culture grew out Gardnerella.  She is taking the Flagyl without relief.  She reports a mild vaginal discharge.  She denies nausea, vomiting, weakness or dizziness.  There are no other known modifying factors.  Past Medical History:  Diagnosis Date  . Abnormal Pap smear   . Anxiety   . History of chlamydia   . HSV (herpes simplex virus) anogenital infection   . No pertinent past medical history     Patient Active Problem List   Diagnosis Date Noted  . Acute cystitis with hematuria 02/07/2018  . Nasal congestion 01/07/2018  . Bacterial vaginosis 06/22/2017  . Loss of weight 11/30/2016  . Headache 11/30/2016  . Depression 08/29/2014  . Insomnia 04/21/2013  . ABNORMAL PAP SMEAR, LGSIL 02/01/2007    Past Surgical History:  Procedure Laterality Date  . COLPOSCOPY  2010  . DILATION AND CURETTAGE OF UTERUS    . NO PAST SURGERIES       OB History    Gravida  5   Para  3   Term  3   Preterm  0   AB  2   Living  3     SAB  0   TAB  0   Ectopic  0   Multiple  0   Live Births  3            Home Medications    Prior to Admission medications   Medication Sig Start Date End Date Taking? Authorizing Provider  cephALEXin (KEFLEX) 500 MG capsule Take 1 capsule (500 mg total) by mouth 4 (four) times daily. 02/05/18   Vanetta Mulders, MD  levonorgestrel (MIRENA) 20 MCG/24HR IUD 1 each by Intrauterine route once.    [provider]    Family History Family History  Problem Relation Age of Onset  .  Stroke Mother   . Diabetes Father   . Thyroid disease Maternal Aunt   . Diabetes Maternal Grandmother     Social History Social History   Tobacco Use  . Smoking status: Never Smoker  . Smokeless tobacco: Never Used  Substance Use Topics  . Alcohol use: Yes    Comment: occasional  . Drug use: No     Allergies   Diflucan [fluconazole]   Review of Systems Review of Systems  All other systems reviewed and are negative.    Physical Exam Updated Vital Signs BP 140/88   Pulse 83   Temp 98.9 F (37.2 C) (Oral)   Resp 18   SpO2 99%   Physical Exam  Constitutional: She is oriented to person, place, and time. She appears well-developed and well-nourished. She appears distressed (She is uncomfortable).  HENT:  Head: Normocephalic and atraumatic.  Eyes: Pupils are equal, round, and reactive to light. Conjunctivae and EOM are normal.  Neck: Normal range of motion and phonation normal. Neck supple.  Cardiovascular: Normal rate and regular rhythm.  Pulmonary/Chest: Effort normal and breath sounds normal. She exhibits no tenderness.  Abdominal: Soft.  She exhibits no distension. There is tenderness (Suprapubic, moderate). There is guarding.  Genitourinary:  Genitourinary Comments: Normal external female genitalia.  Small amount of yellow vaginal discharge is present.  Mild cervical motion tenderness on palpation.  Diffuse pelvic tenderness without enlargement of uterus or ovaries, no palpable mass.  Tenderness is moderate.  Musculoskeletal: Normal range of motion.  Neurological: She is alert and oriented to person, place, and time. She exhibits normal muscle tone.  Skin: Skin is warm and dry.  Psychiatric: She has a normal mood and affect. Her behavior is normal. Judgment and thought content normal.  Nursing note and vitals reviewed.    ED Treatments / Results  Labs (all labs ordered are listed, but only abnormal results are displayed) Labs Reviewed  COMPREHENSIVE METABOLIC  PANEL - Abnormal; Notable for the following components:      Result Value   Potassium 3.3 (*)    Total Bilirubin 1.9 (*)    All other components within normal limits  URINALYSIS, ROUTINE W REFLEX MICROSCOPIC - Abnormal; Notable for the following components:   Color, Urine RED (*)    APPearance TURBID (*)    Specific Gravity, Urine >1.030 (*)    Hgb urine dipstick LARGE (*)    Protein, ur >300 (*)    All other components within normal limits  URINALYSIS, MICROSCOPIC (REFLEX) - Abnormal; Notable for the following components:   Bacteria, UA FEW (*)    All other components within normal limits  WET PREP, GENITAL  CBC WITH DIFFERENTIAL/PLATELET  RPR  HIV ANTIBODY (ROUTINE TESTING)  I-STAT BETA HCG BLOOD, ED ([facility], [facility], [facility] ONLY)  GC/CHLAMYDIA PROBE AMP ([place]) NOT AT [facility]    EKG None  Radiology No results found.  Procedures Procedures (including critical care time)  Medications Ordered in ED Medications  oxyCODONE-acetaminophen (PERCOCET/ROXICET) 5-325 MG per tablet 1 tablet (1 tablet Oral Given 02/10/18 2356)     Initial Impression / Assessment and Plan / ED Course  I have reviewed the triage vital signs and the nursing notes.  Pertinent labs & imaging results that were available during my care of the patient were reviewed by me and considered in my medical decision making (see chart for details).  Clinical Course as of Feb 11 26  Fri Feb 11, 2018  0009 Normal  CBC with Differential/Platelet [EW]  0009 Normal  I-Stat Beta hCG blood, ED ([facility], [facility], [facility] only) [EW]  0009 Urinalysis, Microscopic (reflex)(!) [EW]  0009 Elevated RBCs, and few bacteria  Urinalysis, Microscopic (reflex)(!) [EW]  0010 Abnormal presence of hemoglobin and protein  Urinalysis, Routine w reflex microscopic(!) [EW]  0010 Normal except potassium low and total bilirubin high  Comprehensive metabolic panel(!) [EW]    Clinical Course User Index [EW] Mancel BaleWentz, Levaughn Puccinelli, MD     Patient Vitals for the  past 24 hrs:  BP Temp Temp src Pulse Resp SpO2  02/11/18 0015 140/88 - - 83 - 99 %  02/11/18 0000 (!) 145/87 - - 88 - 100 %  02/10/18 2345 128/76 - - 92 - 100 %  02/10/18 2300 (!) 142/96 - - 87 - 100 %  02/10/18 2053 (!) 143/94 98.9 F (37.2 C) Oral 86 18 99 %     Medical Decision Making: Nonspecific lower abdominal pain, ongoing for 1 week despite comprehensive evaluation and treatment.  Seems more oriented to the pelvic region today.  Pelvic examination abnormal, screening labs sent and pelvic ultrasound ordered for clarification of possible pelvic disorder.  CRITICAL CARE-no Performed  by: Mancel Bale   Nursing Notes Reviewed/ Care Coordinated Applicable Imaging Reviewed Interpretation of Laboratory Data incorporated into ED treatment  Plan-evaluation by oncoming provider team following ultrasound imaging of the pelvis.    Final Clinical Impressions(s) / ED Diagnoses   Final diagnoses:  Pelvic pain  Urinary tract infection with hematuria, site unspecified    ED Discharge Orders    None       Mancel Bale, MD 02/11/18 [phone]

## 2018-02-10 NOTE — ED Triage Notes (Signed)
Pt continues to have pelvic pain and blood in her urine. She did start yesterday on new medication due to resistance per [facility]

## 2018-02-11 ENCOUNTER — Emergency Department (HOSPITAL_COMMUNITY): Payer: Managed Care, Other (non HMO)

## 2018-02-11 DIAGNOSIS — R102 Pelvic and perineal pain: Secondary | ICD-10-CM | POA: Diagnosis not present

## 2018-02-11 LAB — WET PREP, GENITAL
Clue Cells Wet Prep HPF POC: NONE SEEN
Sperm: NONE SEEN
Trich, Wet Prep: NONE SEEN

## 2018-02-11 LAB — GC/CHLAMYDIA PROBE AMP (~~LOC~~) NOT AT ARMC
CHLAMYDIA, DNA PROBE: NEGATIVE
NEISSERIA GONORRHEA: NEGATIVE

## 2018-02-11 LAB — HIV ANTIBODY (ROUTINE TESTING W REFLEX): HIV SCREEN 4TH GENERATION: NONREACTIVE

## 2018-02-11 LAB — RPR: RPR: NONREACTIVE

## 2018-02-11 MED ORDER — KETOROLAC TROMETHAMINE 60 MG/2ML IM SOLN
30.0000 mg | Freq: Once | INTRAMUSCULAR | Status: AC
  Administered 2018-02-11: 30 mg via INTRAMUSCULAR
  Filled 2018-02-11: qty 2

## 2018-02-11 MED ORDER — ONDANSETRON 4 MG PO TBDP
4.0000 mg | ORAL_TABLET | Freq: Once | ORAL | Status: AC
  Administered 2018-02-11: 4 mg via ORAL
  Filled 2018-02-11: qty 1

## 2018-02-11 MED ORDER — AZITHROMYCIN 1 G PO PACK
1.0000 g | PACK | Freq: Once | ORAL | Status: AC
  Administered 2018-02-11: 1 g via ORAL
  Filled 2018-02-11: qty 1

## 2018-02-11 MED ORDER — LIDOCAINE HCL (PF) 1 % IJ SOLN
INTRAMUSCULAR | Status: AC
  Administered 2018-02-11: 5 mL
  Filled 2018-02-11: qty 5

## 2018-02-11 MED ORDER — CEFTRIAXONE SODIUM 250 MG IJ SOLR
250.0000 mg | Freq: Once | INTRAMUSCULAR | Status: AC
  Administered 2018-02-11: 250 mg via INTRAMUSCULAR
  Filled 2018-02-11: qty 250

## 2018-02-11 NOTE — ED Notes (Signed)
Patient transported to Ultrasound 

## 2018-02-11 NOTE — ED Notes (Signed)
ED Provider at bedside. 

## 2018-02-11 NOTE — ED Provider Notes (Signed)
I assumed care of this patient from Dr. Effie ShyWentz at [phone].  Please see their note for further details of Hx, PE.  Briefly patient is a 42 y.o. female who presented with with pelvic pain.  Patient is currently being treated for urinary tract infection with clindamycin.  Treated empirically for STD. Pending ultrasound of the pelvis to rule out TOA.  Ultrasound negative.  The patient is safe for discharge with strict return precautions.   Disposition: Discharge  Condition: Good  I have discussed the results, Dx and Tx plan with the patient who expressed understanding and agree(s) with the plan. Discharge instructions discussed at great length. The patient was given strict return precautions who verbalized understanding of the instructions. No further questions at time of discharge.    ED Discharge Orders    None       Follow Up: Primary care provider  Schedule an appointment as soon as possible for a visit  in 5-7 days to ensure appropriate treatment      Cardama, Amadeo GarnetPedro Eduardo, MD 02/11/18 [phone]

## 2018-03-27 ENCOUNTER — Other Ambulatory Visit: Payer: Self-pay | Admitting: Internal Medicine

## 2018-03-28 NOTE — Telephone Encounter (Signed)
Needs a depression follow up appointment, can patient please be called to schedule

## 2018-03-28 NOTE — Telephone Encounter (Signed)
Pt scheduled for 10/23.

## 2018-04-06 ENCOUNTER — Other Ambulatory Visit: Payer: Self-pay

## 2018-04-06 ENCOUNTER — Encounter: Payer: Self-pay | Admitting: Family Medicine

## 2018-04-06 ENCOUNTER — Ambulatory Visit: Payer: Managed Care, Other (non HMO) | Admitting: Licensed Clinical Social Worker

## 2018-04-06 ENCOUNTER — Ambulatory Visit (INDEPENDENT_AMBULATORY_CARE_PROVIDER_SITE_OTHER): Payer: Managed Care, Other (non HMO) | Admitting: Family Medicine

## 2018-04-06 VITALS — BP 100/70 | HR 72 | Temp 98.1°F | Wt 164.0 lb

## 2018-04-06 DIAGNOSIS — F3160 Bipolar disorder, current episode mixed, unspecified: Secondary | ICD-10-CM

## 2018-04-06 DIAGNOSIS — Z23 Encounter for immunization: Secondary | ICD-10-CM | POA: Diagnosis not present

## 2018-04-06 DIAGNOSIS — R69 Illness, unspecified: Secondary | ICD-10-CM | POA: Diagnosis not present

## 2018-04-06 HISTORY — DX: Bipolar disorder, current episode mixed, unspecified: F31.60

## 2018-04-06 MED ORDER — QUETIAPINE FUMARATE 50 MG PO TABS: 50.0000 mg | ORAL_TABLET | Freq: Every day | ORAL | 0 refills | Status: DC

## 2018-04-06 NOTE — BH Specialist Note (Signed)
Integrated Behavioral Health Warm Handoff  MRN: [id number] Name: Dana Knight  Session Start time: 3:30  Session End time: 3:50 Total time: 20 minutes Type of Service: Integrated Behavioral Health   Warm Hand Off Completed.     Patient  verbally consented to meet with [facility] Consultant about presenting concerns. SUBJECTIVE: Dana Knight is a 42 y.o. female referred by Dr. Artist Pais for assistance with managing symptoms of Bipolar. Report of symptoms: decrease need for sleep, sleeping about 3 to 4 hours a day; excessive spending money not able to account for what she spent it on, not paying bills and mood swings. ASSESSMENT: Mood: within normal limits and Affect: Appropriate.  Patient is currently experiencing symptoms of  Bipolar affective disorder.  Patient may benefit from and is in agreement to receive further assessment and brief therapeutic interventions to assist with managing her symptoms.  PLAN / GOALS: Patient will: 1. Reduce symptoms of: agitation, insomnia and mood instability 2. Increase knowledge and/or ability of: coping skills, healthy habits and self-management skills  3.   Follow up with behavioral health clinician: one week _______________________________________________________  Duration of CURRENT symptoms: out of control spending over the past few months Age of onset of first mood disturbance:on and off for years, noticed more symptoms about 7 years ago. Impact on function:bills behind  Psychiatric History - Diagnoses: Bipolar Affective disorder diagnosed today. See PCP notes for MDQ - Hospitalizations:  none - Pharmacotherapy: started Seroquel 50mg  today 04/06/18 - Outpatient therapy: patient reports none (however informed PCP she had counseling in the past)  Family history of psychiatric issues:Sister Bipolar Disorder, mom has mood issues unsure if she has a formal diagnosis of Bipolar.   Current and history of substance ZDG:LOVFIE all substance  use,   Risk of harm to self or others: No plan to harm self or others  Other:not assesed (Consider trauma, interpersonal violence) Resources Utilized in the past: not assessed  LIFE CONTEXT: Family and Social: lives with children School/Work: works FT, having difficulties at work with focus Self-Care: shopping and spending time with children Life Changes: behind in bills  INTERVENTION:  Motivational Interviewing and Supportive Counseling,  Psychoeducation "Living with Bipolar Disorder" (all screening administered by PCP) PHQ 9=21,indication of : severe depression.  GAD-7=18,indication of : severe anxiety.  MDQ screening 1. Scored 11 out of 13 = positive  2. yes  3. Serious problem  Positive screen.      Sammuel Hines, LCSW Behavioral Health Clinician [facility]   [phone] 10:28 AM

## 2018-04-06 NOTE — Progress Notes (Signed)
    Subjective:  Dana Knight is a 42 y.o. female who presents to the [facility] today with a chief complaint of mood issues.   HPI:  Patient is here for follow-up on her depression.  She has had mood issues her entire adult life for as long as she can remember, greater than 10+ years.  She has episodes where she feels very down and has to force herself to get out of bed and she will cry.   Other times she feels like she is always on the go.  She has times where she feels a bit reckless and will run lights.  Her biggest worries that she can amount off at work and lose her job. She states that she is worried all the time. She has previously had counseling for depression which she did not find helpful. She was tried on Wellbutrin remotely in the past that made her feel like a zombie.  She has been on Remeron that she has not had much benefit. She denies suicidal ideations or homicidal ideations. Her biggest stressors are her difficult family situations.    ROS: Per HPI  Social Hx: She reports that she has never smoked. She has never used smokeless tobacco. She reports that she drinks alcohol. She reports that she does not use drugs.   Objective:  Physical Exam: BP 100/70   Pulse 72   Temp 98.1 F (36.7 C) (Oral)   Wt 164 lb (74.4 kg)   SpO2 99%   BMI 28.15 kg/m   Gen: NAD, resting comfortably CV: RRR with no murmurs appreciated Pulm: NWOB, CTAB with no crackles, wheezes, or rhonchi Neuro: grossly normal, moves all extremities Psych: Normal affect and thought content, slightly pressured speech  Depression screen [facility] 2/9 04/06/2018 02/07/2018 06/22/2017 05/18/2017 02/23/2017  Decreased Interest 3 0 0 0 0  Down, Depressed, Hopeless 3 0 1 0 0  PHQ - 2 Score 6 0 1 0 0  Altered sleeping 3 - - - -  Tired, decreased energy 3 - - - -  Change in appetite 3 - - - -  Feeling bad or failure about yourself  1 - - - -  Trouble concentrating 3 - - - -  Moving slowly or fidgety/restless 2 - - - -    Suicidal thoughts 0 - - - -  PHQ-9 Score 21 - - - -  Difficult doing work/chores Very difficult - - - -   GAD 7 : Generalized Anxiety Score 04/06/2018  Nervous, Anxious, on Edge 3  Control/stop worrying 3  Worry too much - different things 3  Trouble relaxing 3  Restless 3  Easily annoyed or irritable 3  Afraid - awful might happen 0  Total GAD 7 Score 18    MDQ #1 positive  Yes to 11/13, #2 yes, #3 serious problem, no to #4 and #5  Assessment/Plan:  Bipolar affective disorder, current episode mixed (HCC) Patient has a positive MDQ and has symptoms of cycling through mania and depression.  She likely has bipolar disorder and has been diagnosed with depression in the past.  Discussed starting Seroquel 50 mg in the evening and warm handoff to [facility] today.   Leland Her, DO PGY-3, [facility] 04/06/2018 3:14 PM

## 2018-04-06 NOTE — Patient Instructions (Signed)
Start seroquel 50mg  in the evening before you need to go to sleep.   Come back and see me in 2-4 weeks to see how are you doing on this medication.   Meet with Gavin Pound our Child psychotherapist.

## 2018-04-06 NOTE — Assessment & Plan Note (Signed)
Patient has a positive MDQ and has symptoms of cycling through mania and depression.  She likely has bipolar disorder and has been diagnosed with depression in the past.  Discussed starting Seroquel 50 mg in the evening and warm handoff to [facility] today.

## 2018-04-12 ENCOUNTER — Encounter: Payer: Self-pay | Admitting: Licensed Clinical Social Worker

## 2018-04-12 ENCOUNTER — Ambulatory Visit (INDEPENDENT_AMBULATORY_CARE_PROVIDER_SITE_OTHER): Payer: Managed Care, Other (non HMO) | Admitting: Licensed Clinical Social Worker

## 2018-04-12 DIAGNOSIS — R69 Illness, unspecified: Secondary | ICD-10-CM | POA: Diagnosis not present

## 2018-04-12 DIAGNOSIS — F3160 Bipolar disorder, current episode mixed, unspecified: Secondary | ICD-10-CM

## 2018-04-12 NOTE — BH Specialist Note (Signed)
Integrated Behavioral Health Follow Up Visit  MRN: [id number] Name: Dana Knight  Number of Integrated Behavioral Health Clinician visits: 1/6 Session Start time: 3:00  Session End time: 3:40 Total time: 40 minutes  Reason for follow-up: Continue brief intervention to assist patient with managing symptoms of Bipolar, as well as stressors .  Report of symptoms: difficult with sleeping ; easily irritated, difficulty with focus.  difficulty in daily functions.   ASSESSMENT: Mood: within normal limits and Affect: Appropriate;Thought process: Coherent;  No plan to harm self or others Patient continues to experience the symptoms listed above. She has not started Seroquel due to concerns of how it would make her feel and not wanting to take it in the evening.   Patient will take medication when she get off of work , this is when she goes to sleep. Patient agrees with will work better for her and she is willing start the medication.(discussed this change with preceptor Dr. Leveda Anna)   Patient may benefit from, and is in agreement to continue ongoing assessment and therapeutic interventions with Employee Assistance program to assist with managing her symptoms.  PLAN :Patient will: 1. Reduce symptoms of: insomnia and mood instability 2. Increase knowledge and/or ability of: coping skills and self-management skills  3.    Patient will F/U with  PCP in 2 to 3 weeks 4.    Behavioral recommendations: sleep hygiene, relaxed breathing, going to gym 2 days a week.  5.     Referral: Counselor  With E[employer] on her job Intervention: Solution-Focused Strategies and [employer], Reflective listening, Behavioral Therapy (Relaxed breathing); Psychoeducation, Consult MD and Referral to Counselor/Psychotherapist   No screening completed today.  Sammuel Hines, LCSW Behavioral Health Clinician [facility]   [phone] 4:08 PM

## 2018-04-20 ENCOUNTER — Emergency Department (HOSPITAL_COMMUNITY)
Admission: EM | Admit: 2018-04-20 | Discharge: 2018-04-20 | Disposition: A | Discharge status: Home / Self Care | Payer: 59 | Attending: Emergency Medicine | Admitting: Emergency Medicine

## 2018-04-20 ENCOUNTER — Encounter (HOSPITAL_COMMUNITY): Payer: Self-pay

## 2018-04-20 DIAGNOSIS — Y929 Unspecified place or not applicable: Secondary | ICD-10-CM | POA: Diagnosis not present

## 2018-04-20 DIAGNOSIS — Y999 Unspecified external cause status: Secondary | ICD-10-CM | POA: Diagnosis not present

## 2018-04-20 DIAGNOSIS — S71112A Laceration without foreign body, left thigh, initial encounter: Secondary | ICD-10-CM | POA: Insufficient documentation

## 2018-04-20 DIAGNOSIS — Z23 Encounter for immunization: Secondary | ICD-10-CM | POA: Diagnosis not present

## 2018-04-20 DIAGNOSIS — Z79899 Other long term (current) drug therapy: Secondary | ICD-10-CM | POA: Diagnosis not present

## 2018-04-20 DIAGNOSIS — Y939 Activity, unspecified: Secondary | ICD-10-CM | POA: Diagnosis not present

## 2018-04-20 DIAGNOSIS — W25XXXA Contact with sharp glass, initial encounter: Secondary | ICD-10-CM | POA: Diagnosis not present

## 2018-04-20 DIAGNOSIS — S81812A Laceration without foreign body, left lower leg, initial encounter: Secondary | ICD-10-CM

## 2018-04-20 MED ORDER — TETANUS-DIPHTH-ACELL PERTUSSIS 5-2.5-18.5 LF-MCG/0.5 IM SUSP
0.5000 mL | Freq: Once | INTRAMUSCULAR | Status: AC
  Administered 2018-04-20: 0.5 mL via INTRAMUSCULAR
  Filled 2018-04-20: qty 0.5

## 2018-04-20 MED ORDER — BUPIVACAINE-EPINEPHRINE (PF) 0.5% -1:200000 IJ SOLN
10.0000 mL | Freq: Once | INTRAMUSCULAR | Status: AC
  Administered 2018-04-20: 10 mL
  Filled 2018-04-20: qty 10

## 2018-04-20 NOTE — ED Notes (Signed)
Telfa no-stick dressing applied with gauze and a bandage roll.

## 2018-04-20 NOTE — ED Notes (Signed)
ED Provider at bedside. 

## 2018-04-20 NOTE — ED Provider Notes (Signed)
[facility]-EMERGENCY DEPT Provider Note   CSN: [id number] Arrival date & time: 04/20/18  4098     History   Chief Complaint Chief Complaint  Patient presents with  . Laceration    HPI Dana Knight is a 42 y.o. female.  HPI    Dana Knight is a 42 y.o. female, with a history of anxiety, presenting to the ED with a laceration to the left upper leg that occurred shortly prior to arrival.  States she knocked over a lamp with a glass lampshade and was cut by some of the glass.  Pain is minimal currently.  Bleeding is controlled.  Tdap is not up-to-date. Denies numbness, muscular pain, weakness, other injuries, or any other complaints.   Past Medical History:  Diagnosis Date  . Abnormal Pap smear   . Anxiety   . History of chlamydia   . HSV (herpes simplex virus) anogenital infection   . No pertinent past medical history     Patient Active Problem List   Diagnosis Date Noted  . Bipolar affective disorder, current episode mixed (HCC) 04/06/2018  . Loss of weight 11/30/2016  . Insomnia 04/21/2013  . ABNORMAL PAP SMEAR, LGSIL 02/01/2007    Past Surgical History:  Procedure Laterality Date  . COLPOSCOPY  2010  . DILATION AND CURETTAGE OF UTERUS    . NO PAST SURGERIES       OB History    Gravida  5   Para  3   Term  3   Preterm  0   AB  2   Living  3     SAB  0   TAB  0   Ectopic  0   Multiple  0   Live Births  3            Home Medications    Prior to Admission medications   Medication Sig Start Date End Date Taking? Authorizing Provider  levonorgestrel (MIRENA) 20 MCG/24HR IUD 1 each by Intrauterine route once.   Yes [provider]  mirtazapine (REMERON) 15 MG tablet TAKE 1 TABLET BY MOUTH EVERYDAY AT BEDTIME Patient taking differently: Take 15 mg by mouth at bedtime.  03/28/18  Yes Jeneen Rinks J, DO  QUEtiapine (SEROQUEL) 50 MG tablet Take 1 tablet (50 mg total) by mouth at bedtime. 04/06/18  Yes Jeneen Rinks J, DO  cephALEXin (KEFLEX) 500 MG capsule Take 1 capsule (500 mg total) by mouth 4 (four) times daily. Patient not taking: Reported on 04/20/2018 02/05/18   Vanetta Mulders, MD    Family History Family History  Problem Relation Age of Onset  . Stroke Mother   . Diabetes Father   . Thyroid disease Maternal Aunt   . Diabetes Maternal Grandmother     Social History Social History   Tobacco Use  . Smoking status: Never Smoker  . Smokeless tobacco: Never Used  Substance Use Topics  . Alcohol use: Yes    Comment: occasional  . Drug use: No     Allergies   Diflucan [fluconazole]   Review of Systems Review of Systems  Skin: Positive for wound.  Neurological: Negative for weakness and numbness.     Physical Exam Updated Vital Signs BP 139/87 (BP Location: Right Arm)   Pulse 76   Temp 97.9 F (36.6 C) (Oral)   Resp 18   Ht 5\' 5"  (1.651 m)   Wt 74.8 kg   LMP  (Approximate)   SpO2 100%  BMI 27.46 kg/m   Physical Exam  Constitutional: She appears well-developed and well-nourished. No distress.  HENT:  Head: Normocephalic and atraumatic.  Eyes: Conjunctivae are normal.  Neck: Neck supple.  Cardiovascular: Normal rate, regular rhythm and intact distal pulses.  Pulmonary/Chest: Effort normal.  Musculoskeletal:  Range of motion in the left upper leg fully intact without significant pain.  Neurological: She is alert.  Strength in the left lower extremity 5/5. Sensation to light touch grossly intact in the left lower extremity.  Skin: Skin is warm and dry. She is not diaphoretic. No pallor.  6 cm and 2 cm linear lacerations noted to the left lateral upper leg.  Hemorrhage controlled.  No noted muscular exposure.  No noted foreign bodies.  Psychiatric: She has a normal mood and affect. Her behavior is normal.  Nursing note and vitals reviewed.            ED Treatments / Results  Labs (all labs ordered are listed, but only abnormal results are  displayed) Labs Reviewed - No data to display  EKG None  Radiology No results found.  Procedures .Marland KitchenLaceration Repair Date/Time: 04/20/2018 10:30 AM Performed by: Anselm Pancoast, PA-C Authorized by: Anselm Pancoast, PA-C   Consent:    Consent obtained:  Verbal   Consent given by:  Patient   Risks discussed:  Infection, need for additional repair, pain, poor cosmetic result, poor wound healing and retained foreign body Anesthesia (see MAR for exact dosages):    Anesthesia method:  Local infiltration   Local anesthetic:  Bupivacaine 0.5% WITH epi Laceration details:    Location:  Leg   Leg location:  L upper leg   Length (cm):  6 Repair type:    Repair type:  Simple Pre-procedure details:    Preparation:  Patient was prepped and draped in usual sterile fashion Exploration:    Wound exploration: wound explored through full range of motion and entire depth of wound probed and visualized   Treatment:    Area cleansed with:  Betadine and saline   Amount of cleaning:  Standard   Irrigation solution:  Sterile saline   Irrigation method:  Syringe Skin repair:    Repair method:  Sutures   Suture size:  3-0   Suture material:  Prolene   Suture technique:  Horizontal mattress   Number of sutures:  8 Approximation:    Approximation:  Close Post-procedure details:    Dressing:  Sterile dressing   Patient tolerance of procedure:  Tolerated well, no immediate complications .Marland KitchenLaceration Repair Date/Time: 04/20/2018 10:48 AM Performed by: Anselm Pancoast, PA-C Authorized by: Anselm Pancoast, PA-C   Consent:    Consent obtained:  Verbal   Consent given by:  Patient   Risks discussed:  Infection, pain, need for additional repair, poor cosmetic result, poor wound healing and retained foreign body Anesthesia (see MAR for exact dosages):    Anesthesia method:  Local infiltration   Local anesthetic:  Bupivacaine 0.5% WITH epi Laceration details:    Location:  Leg   Leg location:  L upper  leg   Length (cm):  2 Repair type:    Repair type:  Simple Pre-procedure details:    Preparation:  Patient was prepped and draped in usual sterile fashion Exploration:    Wound exploration: wound explored through full range of motion and entire depth of wound probed and visualized   Treatment:    Area cleansed with:  Betadine and saline   Amount of  cleaning:  Standard   Irrigation solution:  Sterile saline   Irrigation method:  Syringe Skin repair:    Repair method:  Sutures   Suture size:  3-0   Suture material:  Prolene   Suture technique:  Horizontal mattress   Number of sutures:  3 Approximation:    Approximation:  Close Post-procedure details:    Dressing:  Sterile dressing   Patient tolerance of procedure:  Tolerated well, no immediate complications   (including critical care time)  Medications Ordered in ED Medications  Tdap (BOOSTRIX) injection 0.5 mL (0.5 mLs Intramuscular Given 04/20/18 0927)  bupivacaine-epinephrine (MARCAINE W/ EPI) 0.5% -1:200000 injection 10 mL (10 mLs Infiltration Given 04/20/18 1116)     Initial Impression / Assessment and Plan / ED Course  I have reviewed the triage vital signs and the nursing notes.  Pertinent labs & imaging results that were available during my care of the patient were reviewed by me and considered in my medical decision making (see chart for details).     Patient presents with 2 lacerations to the left upper leg.  These were repaired without immediate complication.  Patient will return in 8 to 12 days for suture removal. The patient was given instructions for home care as well as return precautions. Patient voices understanding of these instructions, accepts the plan, and is comfortable with discharge.  Final Clinical Impressions(s) / ED Diagnoses   Final diagnoses:  Laceration of left lower extremity, initial encounter    ED Discharge Orders    None       Concepcion Living 04/20/18 1452    Benjiman Core, MD 04/20/18 1615

## 2018-04-20 NOTE — ED Triage Notes (Addendum)
Pt presents with c/o laceration to the upper part of her left thigh, bleeding controlled. Pt reports that her lamp fell this morning, cutting her leg. Pt is ambulatory.

## 2018-04-20 NOTE — ED Notes (Signed)
Wound cleaned and irrigated by Felipe Drone, NT.

## 2018-04-20 NOTE — Discharge Instructions (Signed)
  Wound Care - Laceration You may remove the bandage after 24 hours. Clean the wound and surrounding area gently with tap water and mild soap. Rinse well and blot dry. Do not scrub the wound, as this may cause the wound edges to come apart. You may shower, but avoid submerging the wound, such as with a bath or swimming. Clean the wound daily to prevent infection. Do not use cleaners such as hydrogen peroxide or alcohol.   Scar reduction: Application of a topical antibiotic ointment, such as Neosporin, after the wound has begun to close and heal well can decrease scab formation and reduce scarring. After the wound has healed and wound closures have been removed, application of ointments such as Aquaphor can also reduce scar formation.  The key to scar reduction is keeping the skin well hydrated and supple. Drinking plenty of water throughout the day (At least eight 8oz glasses of water a day) is essential to staying well hydrated.  Sun exposure: Keep the wound out of the sun. After the wound has healed, continue to protect it from the sun by wearing protective clothing or applying sunscreen.  Pain: You may use Tylenol, naproxen, or ibuprofen for pain.  Suture/staple removal: Return to the ED in 8-12 days for suture removal.  Return to the ED sooner should the wound edges come apart or signs of infection arise, such as spreading redness, puffiness/swelling, pus draining from the wound, severe increase in pain, fever over 100.3F, or any other major issues.  For prescription assistance, may try using prescription discount sites or apps, such as goodrx.com 

## 2018-04-22 ENCOUNTER — Telehealth: Payer: Self-pay | Admitting: Licensed Clinical Social Worker

## 2018-04-22 NOTE — Progress Notes (Signed)
[facility] F/U Call.  To see how patient is doing on medication and how she is sleeping. Left message to call LCSW.  Plan: LCSW will wait for return call, if no return call is received, will see patient during office visit with provider.  Sammuel Hines, LCSW Behavioral Health Clinician [facility]   [phone] 10:45 AM

## 2018-04-27 ENCOUNTER — Other Ambulatory Visit: Payer: Self-pay | Admitting: Family Medicine

## 2018-04-27 NOTE — Telephone Encounter (Signed)
No refill as patient was taken off this medication at last visit with me.

## 2018-05-02 ENCOUNTER — Encounter (HOSPITAL_COMMUNITY): Payer: Self-pay | Admitting: Emergency Medicine

## 2018-05-02 ENCOUNTER — Emergency Department (HOSPITAL_COMMUNITY)
Admission: EM | Admit: 2018-05-02 | Discharge: 2018-05-02 | Disposition: A | Discharge status: Home / Self Care | Payer: 59 | Attending: Emergency Medicine | Admitting: Emergency Medicine

## 2018-05-02 DIAGNOSIS — Z4802 Encounter for removal of sutures: Secondary | ICD-10-CM | POA: Insufficient documentation

## 2018-05-02 NOTE — ED Notes (Signed)
Bed: WTR5 Expected date:  Expected time:  Means of arrival:  Comments: 

## 2018-05-02 NOTE — ED Triage Notes (Signed)
Pt report that she needs to get her stitches removed out of left thigh. Denies s/s of infection but reports little pain still.

## 2018-05-02 NOTE — ED Provider Notes (Signed)
[facility]-EMERGENCY DEPT Provider Note   CSN: [id number] Arrival date & time: 05/02/18  7829     History   Chief Complaint Chief Complaint  Patient presents with  . Suture / Staple Removal    HPI Dana Knight is a 42 y.o. female.  HPI Requesting suture removal for left lateral thigh. 11 sutures placed 12 days ago. No other complaints   Past Medical History:  Diagnosis Date  . Abnormal Pap smear   . Anxiety   . History of chlamydia   . HSV (herpes simplex virus) anogenital infection   . No pertinent past medical history     Patient Active Problem List   Diagnosis Date Noted  . Bipolar affective disorder, current episode mixed (HCC) 04/06/2018  . Loss of weight 11/30/2016  . Insomnia 04/21/2013  . ABNORMAL PAP SMEAR, LGSIL 02/01/2007    Past Surgical History:  Procedure Laterality Date  . COLPOSCOPY  2010  . DILATION AND CURETTAGE OF UTERUS    . NO PAST SURGERIES       OB History    Gravida  5   Para  3   Term  3   Preterm  0   AB  2   Living  3     SAB  0   TAB  0   Ectopic  0   Multiple  0   Live Births  3            Home Medications    Prior to Admission medications   Medication Sig Start Date End Date Taking? Authorizing Provider  cephALEXin (KEFLEX) 500 MG capsule Take 1 capsule (500 mg total) by mouth 4 (four) times daily. Patient not taking: Reported on 04/20/2018 02/05/18   Vanetta Mulders, MD  levonorgestrel (MIRENA) 20 MCG/24HR IUD 1 each by Intrauterine route once.    [provider]  mirtazapine (REMERON) 15 MG tablet TAKE 1 TABLET BY MOUTH EVERYDAY AT BEDTIME Patient taking differently: Take 15 mg by mouth at bedtime.  03/28/18   Leland Her, DO  QUEtiapine (SEROQUEL) 50 MG tablet Take 1 tablet (50 mg total) by mouth at bedtime. 04/06/18   Leland Her, DO    Family History Family History  Problem Relation Age of Onset  . Stroke Mother   . Diabetes Father   . Thyroid disease  Maternal Aunt   . Diabetes Maternal Grandmother     Social History Social History   Tobacco Use  . Smoking status: Never Smoker  . Smokeless tobacco: Never Used  Substance Use Topics  . Alcohol use: Yes    Comment: occasional  . Drug use: No     Allergies   Diflucan [fluconazole]   Review of Systems Review of Systems  All other systems reviewed and are negative.    Physical Exam Updated Vital Signs BP (!) 123/107 (BP Location: Right Arm)   Pulse 81   Temp 98.6 F (37 C) (Oral)   Resp 17   SpO2 100%   Physical Exam  Constitutional: She is oriented to person, place, and time. She appears well-developed and well-nourished.  HENT:  Head: Normocephalic.  Eyes: EOM are normal.  Neck: Normal range of motion.  Pulmonary/Chest: Effort normal.  Abdominal: She exhibits no distension.  Musculoskeletal: Normal range of motion.  Suture line intact. No signs of infection  Neurological: She is alert and oriented to person, place, and time.  Skin: Skin is warm.  Psychiatric: She has a normal  mood and affect.  Nursing note and vitals reviewed.    ED Treatments / Results  Labs (all labs ordered are listed, but only abnormal results are displayed) Labs Reviewed - No data to display  EKG None  Radiology No results found.  Procedures .Suture Removal Performed by: Azalia Bilisampos, Madailein Londo, MD Authorized by: Azalia Bilisampos, Elyana Grabski, MD     SUTURE REMOVAL Performed by: Azalia BilisKevin Lyfe Monger Consent: Verbal consent obtained. Patient identity confirmed: provided demographic data Time out: Immediately prior to procedure a "time out" was called to verify the correct patient, procedure, equipment, support staff and site/side marked as required. Location: left lateral thigh Wound Appearance: clean Sutures/Staples Removed: 11 Patient tolerance: Patient tolerated the procedure well with no immediate complications.     Medications Ordered in ED Medications - No data to display   Initial  Impression / Assessment and Plan / ED Course  I have reviewed the triage vital signs and the nursing notes.  Pertinent labs & imaging results that were available during my care of the patient were reviewed by me and considered in my medical decision making (see chart for details).     Suture removed without difficulty  Final Clinical Impressions(s) / ED Diagnoses   Final diagnoses:  Visit for suture removal    ED Discharge Orders    None       Azalia Bilisampos, Germaine Ripp, MD 05/02/18 1026

## 2018-05-04 ENCOUNTER — Other Ambulatory Visit: Payer: Self-pay | Admitting: Family Medicine

## 2018-05-04 DIAGNOSIS — F3160 Bipolar disorder, current episode mixed, unspecified: Secondary | ICD-10-CM

## 2018-05-05 ENCOUNTER — Ambulatory Visit: Payer: Managed Care, Other (non HMO) | Admitting: Family Medicine

## 2018-05-05 ENCOUNTER — Other Ambulatory Visit: Payer: Self-pay | Admitting: Family Medicine

## 2018-05-05 DIAGNOSIS — F3160 Bipolar disorder, current episode mixed, unspecified: Secondary | ICD-10-CM

## 2018-05-05 NOTE — Telephone Encounter (Signed)
30d refill as patient needs to be seen for follow up. Patient no showed appt today.

## 2018-05-10 DIAGNOSIS — N390 Urinary tract infection, site not specified: Secondary | ICD-10-CM | POA: Diagnosis not present

## 2018-05-30 ENCOUNTER — Other Ambulatory Visit: Payer: Self-pay | Admitting: Family Medicine

## 2018-05-30 DIAGNOSIS — F3160 Bipolar disorder, current episode mixed, unspecified: Secondary | ICD-10-CM

## 2018-05-30 NOTE — Telephone Encounter (Signed)
No refill without appointment. Patient no showed her last appointment to follow up on mood

## 2018-05-31 NOTE — Telephone Encounter (Signed)
Patient made an appointment for Monday but is not currently out of medication and will wait til Monday to get the refill.  Jazmin Hartsell,CMA

## 2018-06-06 ENCOUNTER — Ambulatory Visit: Payer: 59 | Admitting: Family Medicine

## 2018-06-23 ENCOUNTER — Ambulatory Visit: Payer: 59 | Admitting: Family Medicine

## 2018-07-05 DIAGNOSIS — R21 Rash and other nonspecific skin eruption: Secondary | ICD-10-CM | POA: Diagnosis not present

## 2018-08-02 ENCOUNTER — Other Ambulatory Visit: Payer: Self-pay | Admitting: *Deleted

## 2018-08-02 NOTE — Patient Outreach (Addendum)
[facility]) Care Management  08/02/2018  Dana Knight 07-19-1975 [id number]   Telephone Screening Date referral received: 07/27/18 Initial outreach: 08/02/18 Insurance: [employer] P[employer]  Subjective:  Initial unsuccessful telephone call to patient's mobile number in order to complete screening call/ assessment for [employer] referral; no answer, left HIPAA compliant voicemail message requesting return call.   Objective:  Per the electronic medical record, Ms. Frieson had 5 emergency room visits in 2019: on 11/11/17 at [facility] Emergency Department for acute lower back pain, diagnosed as musculoskeletal in origin, again on 02/05/18 for complaints of hematuria, her urine culture was positive for gardnerellla vaginalis and she was prescribed flagyl 500 mg x 7 days, she was seen again at [facility] Emergency Department on 02/10/18 for c/o hematuria and  pelvic pain - she was treated empirically for STD with keflex, on 04/20/18 she was seen at the [facility]  Emergency Department for a large and small left upper leg lacerations; both requiring sutures.  She received a Tdap injection.  She returned to the [facility] Emergency Department on 05/02/18 as instructed for suture removal. Her most recent primary care provider visit was 04/06/18 for follow up of her depression, she was diagnosed with bipolar affective disorder and prescribed Seroquel. She also saw a licensed clinical social worker on 04/06/18 and 04/12/18 for follow up of her bipolar disorder and was referred to her employer's employee assistance program for ongoing counseling.  The electronic medical record shows her last inpatient  hospitalization as 03/06/2013 for bacterial vaginosis.   Comorbidities include: Bipolar affective disorder, abnormal PAP smears, insomnia, and weight loss.   Plan: This RNCM will route unsuccessful outreach letter with [facility] Care Management pamphlet and 24 hour Nurse Advice  Line Magnet to [employer] Care Management clinical pool to be mailed to patient's home address. This RNCM will attempt another outreach within 4 business days.  Bary Richard RN,CCM,CDE [facility] Care Management Coordinator Office Phone [phone] Office Fax [phone]

## 2018-08-05 ENCOUNTER — Ambulatory Visit: Payer: Self-pay | Admitting: *Deleted

## 2018-08-05 ENCOUNTER — Other Ambulatory Visit: Payer: Self-pay | Admitting: *Deleted

## 2018-08-05 NOTE — Patient Outreach (Signed)
[facility]) Care Management  08/05/2018  Dana Knight 12-Apr-1976 [id number]   Telephone Screening Date referral received: 07/27/18 Initial outreach: 08/02/18 Insurance: Monia Pouch   Second unsuccessful telephone call to patient's mobile number in order to complete screening assessment for [facility] referral; no answer, left HIPAA compliant voicemail message requesting return call.  If no return call from patient, will attempt another outreach within 4 business days.   Bary Richard RN,CCM,CDE [facility] Care Management Coordinator Office Phone [phone] Office Fax [phone]

## 2018-08-08 ENCOUNTER — Ambulatory Visit: Payer: Self-pay | Admitting: *Deleted

## 2018-08-09 ENCOUNTER — Other Ambulatory Visit: Payer: Self-pay | Admitting: *Deleted

## 2018-08-09 NOTE — Patient Outreach (Signed)
[facility]) Care Management  08/09/2018  RUTHY DOMINSKI February 22, 1976 [id number]  Telephone Screening Date referral received: 07/27/18 Initial outreach: 08/02/18 Insurance: Monia Pouch  Third unsuccessful telephone call to patient's preferred number in order to complete [employer] referral high risk screening; no answer, left HIPAA compliant voicemail message requesting return call.   If no return call from patient, will close case to [facility] Care Management services in 10 business days after initial outreach. (08/15/18)  Bary Richard RN,CCM,CDE [facility] Care Management Coordinator Office Phone [phone] Office Fax [phone]

## 2018-08-10 ENCOUNTER — Ambulatory Visit: Payer: Self-pay | Admitting: *Deleted

## 2018-08-15 ENCOUNTER — Other Ambulatory Visit: Payer: Self-pay | Admitting: *Deleted

## 2018-08-15 NOTE — Patient Outreach (Signed)
[facility]) Care Management  08/15/2018  Dana Knight 05-28-1976 [id number]   Case Closure- Unsuccessful Outreach Insurance: [employer] Referral received: 07/27/18 Initial outreach: 08/02/18   Unable to complete [employer] referral high risk screening;  no return call from patient after 3 attempts and no response to request to contact this RNCM in unsuccessful outreach letter.    Plan: Case closed to [facility] Care Management services as it has been 10 business days since initial outreach attempt.   Bary Richard RN,CCM,CDE [facility] Care Management Coordinator Office Phone [phone] Office Fax [phone]

## 2018-09-20 ENCOUNTER — Other Ambulatory Visit (HOSPITAL_COMMUNITY)
Admission: RE | Admit: 2018-09-20 | Discharge: 2018-09-20 | Disposition: A | Discharge status: Home / Self Care | Payer: 59 | Source: Ambulatory Visit | Attending: Family Medicine | Admitting: Family Medicine

## 2018-09-20 ENCOUNTER — Other Ambulatory Visit: Payer: Self-pay

## 2018-09-20 ENCOUNTER — Ambulatory Visit (INDEPENDENT_AMBULATORY_CARE_PROVIDER_SITE_OTHER): Payer: 59 | Admitting: Family Medicine

## 2018-09-20 VITALS — BP 104/62 | HR 80 | Temp 98.7°F | Ht 65.0 in | Wt 166.0 lb

## 2018-09-20 DIAGNOSIS — R102 Pelvic and perineal pain: Secondary | ICD-10-CM | POA: Diagnosis present

## 2018-09-20 DIAGNOSIS — B9689 Other specified bacterial agents as the cause of diseases classified elsewhere: Secondary | ICD-10-CM | POA: Diagnosis not present

## 2018-09-20 DIAGNOSIS — N760 Acute vaginitis: Secondary | ICD-10-CM

## 2018-09-20 LAB — POCT WET PREP (WET MOUNT)
Clue Cells Wet Prep Whiff POC: POSITIVE
Trichomonas Wet Prep HPF POC: ABSENT

## 2018-09-20 LAB — POCT URINE PREGNANCY: Preg Test, Ur: NEGATIVE

## 2018-09-20 LAB — POCT URINALYSIS DIP (MANUAL ENTRY)
Bilirubin, UA: NEGATIVE
Blood, UA: NEGATIVE
Glucose, UA: NEGATIVE mg/dL
Ketones, POC UA: NEGATIVE mg/dL
Leukocytes, UA: NEGATIVE
Nitrite, UA: NEGATIVE
Protein Ur, POC: NEGATIVE mg/dL
Spec Grav, UA: 1.02 (ref 1.010–1.025)
Urobilinogen, UA: 1 E.U./dL
pH, UA: 7 (ref 5.0–8.0)

## 2018-09-20 MED ORDER — METRONIDAZOLE 500 MG PO TABS: 500.0000 mg | ORAL_TABLET | Freq: Two times a day (BID) | ORAL | 0 refills | Status: AC

## 2018-09-20 NOTE — Progress Notes (Signed)
   Subjective:   Patient ID: Dana Knight    DOB: 1975/09/25, 43 y.o. female   MRN: [id number]  Dana Knight is a 43 y.o. female here for STD testing.   Pelvic cramping x 4 days, continuous, treating with OTC Advil/Aleve, eases up the pain. Currently has Mirena IUD, replaced in 05/2016. Sexually active with one partner, does not wear condoms, but notes partner has been "acting funny". Patient notes she "may be ovulating but wanted to be checked out just in case". Denies any vaginal discharge, vaginal bleeding, pain with urination, fevers, chills, pain with sexual intercourse, genital/vaginal lesions. Notes patient does not have periods with IUD. Notes increased urination lately and does endorse tender breasts. Denies any galactorrhea.   Review of Systems:  Per HPI.   PMFSH, medications and smoking status reviewed.  Objective:   BP 104/62   Pulse 80   Temp 98.7 F (37.1 C) (Oral)   Ht 5\' 5"  (1.651 m)   Wt 166 lb (75.3 kg)   SpO2 99%   BMI 27.62 kg/m  Vitals and nursing note reviewed.  General: well nourished, well developed, in no acute distress with non-toxic appearance, sitting comfortably in exam chair HEENT: normocephalic, atraumatic, moist mucous membranes Neck: supple, normal ROM CV: regular rate and rhythm without murmurs, rubs, or gallops, no lower extremity edema Lungs: clear to auscultation bilaterally with normal work of breathing Abdomen: soft, non-tender, non-distended, normoactive bowel sounds Skin: warm, dry Extremities: warm and well perfused, normal tone Neuro: Alert and oriented, speech normal GU/GYN: Exam performed in the presence of a chaperone. External genitalia within normal limits.  Vaginal mucosa pink, moist, normal rugae.  Nonfriable cervix without lesions, no discharge or bleeding noted on speculum exam. IUD strings visible  Bimanual exam revealed normal, nongravid uterus.  No cervical motion tenderness. No adnexal masses bilaterally.    Assessment  & Plan:   Pelvic cramping Per patient request, STD testing performed. Patient declined HIV.  Urine pregnancy test negative. UA negative for infection. No signs of PID. No abnormal discharge or bleeding  noted on exam. IUD strings in place. Wet prep significant for BV. GC/Cl pending. See plan BV below. Will contact patient with any further positive results. Patient informed to contact clinic if symptoms don't improve after treatment or worsen.   Bacterial vaginosis Metronidazole 500mg  BID x 7 days SE discussed, including avoiding alochol. Patient understood and agreed to plan.  Orders Placed This Encounter  Procedures  . POCT urinalysis dipstick  . POCT urine pregnancy  . POCT Wet Prep [facility])   Meds ordered this encounter  Medications  . metroNIDAZOLE (FLAGYL) 500 MG tablet    Sig: Take 1 tablet (500 mg total) by mouth 2 (two) times daily for 7 days.    Dispense:  14 tablet    Refill:  0    Orpah Cobb, DO PGY-1, [facility] 09/21/2018 3:22 PM

## 2018-09-20 NOTE — Progress Notes (Signed)
uurine

## 2018-09-21 DIAGNOSIS — R102 Pelvic and perineal pain: Secondary | ICD-10-CM | POA: Insufficient documentation

## 2018-09-21 NOTE — Assessment & Plan Note (Addendum)
Per patient request, STD testing performed. Patient declined HIV.  Urine pregnancy test negative. UA negative for infection. No signs of PID. No abnormal discharge or bleeding  noted on exam. IUD strings in place. Wet prep significant for BV. GC/Cl pending. See plan BV below. Will contact patient with any further positive results. Patient informed to contact clinic if symptoms don't improve after treatment or worsen.

## 2018-09-21 NOTE — Assessment & Plan Note (Signed)
Metronidazole 500mg  BID x 7 days SE discussed, including avoiding alochol. Patient understood and agreed to plan.

## 2018-09-22 LAB — CERVICOVAGINAL ANCILLARY ONLY
Chlamydia: NEGATIVE
Neisseria Gonorrhea: NEGATIVE

## 2018-11-23 ENCOUNTER — Ambulatory Visit: Payer: 59 | Admitting: Family Medicine

## 2018-12-08 ENCOUNTER — Encounter: Payer: Self-pay | Admitting: Family Medicine

## 2018-12-08 ENCOUNTER — Other Ambulatory Visit: Payer: Self-pay

## 2018-12-08 ENCOUNTER — Other Ambulatory Visit: Payer: 59

## 2018-12-08 ENCOUNTER — Telehealth (INDEPENDENT_AMBULATORY_CARE_PROVIDER_SITE_OTHER): Payer: 59 | Admitting: Family Medicine

## 2018-12-08 ENCOUNTER — Telehealth: Payer: Self-pay | Admitting: General Practice

## 2018-12-08 DIAGNOSIS — Z20822 Contact with and (suspected) exposure to covid-19: Secondary | ICD-10-CM

## 2018-12-08 DIAGNOSIS — Z20828 Contact with and (suspected) exposure to other viral communicable diseases: Secondary | ICD-10-CM | POA: Diagnosis not present

## 2018-12-08 NOTE — Addendum Note (Signed)
Addended by: Denman George on: 12/08/2018 11:30 AM   Modules accepted: Orders

## 2018-12-08 NOTE — Progress Notes (Signed)
[facility] Tele[facility] Visit  Patient consented to have virtual visit. Method of visit: Video  Encounter participants: Patient: Dana Knight - located at outside of home Provider: Bufford Lope - located at [facility] Others (if applicable): none  Chief Complaint: COVID exposure  HPI:  Patient states that her daughter has been having COVID symptoms.  They were exposed to other people with COVID recently over Father's Day.  Patient states that she is not having any COVID symptoms.  No fever, cough, shortness of breath. 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  She is due to start back on Tuesday.    ROS: per HPI  Pertinent PMHx: Not applicable  Exam:  General: Well-appearing, in no acute distress Respiratory: Normal work of breathing, speaks in full sentences  Assessment/Plan: 1. Exposure to Covid-19 Virus Patient has had exposure to COVID.  Test needed.  Message sent to testing center.  Work note created and will be mailed to patient.   Time spent during visit with patient: 6 minutes  Bufford Lope, DO PGY-3, [facility] 12/08/2018 10:59 AM

## 2018-12-08 NOTE — Telephone Encounter (Signed)
-----   Message from Bufford Lope, DO sent at 12/08/2018 10:50 AM EDT ----- Regarding: COVID test COVID Drive-Up Test Referral Criteria  Patient age: 43 y.o.  Symptoms: No Symptoms  Underlying Conditions: No underlying conditions  Is the patient a first responder? No  Does the patient live or work in a high risk or high density environment: No  Is the patient a COVID convalescent patient who is 14-28 days symptom-free and interested in donating plasma for use as a therapeutic product? No

## 2018-12-08 NOTE — Telephone Encounter (Signed)
Pt has been scheduled for covid testing. Scheduled with pt directly. Pt was referred by: Bufford Lope, DO

## 2018-12-13 LAB — NOVEL CORONAVIRUS, NAA: SARS-CoV-2, NAA: NOT DETECTED

## 2018-12-14 ENCOUNTER — Other Ambulatory Visit: Payer: Self-pay | Admitting: Family Medicine

## 2018-12-14 DIAGNOSIS — F3160 Bipolar disorder, current episode mixed, unspecified: Secondary | ICD-10-CM

## 2018-12-14 NOTE — Telephone Encounter (Signed)
Will forward to Dr. McDiarmid who is filling in for Dr. Lora Havens patients.  Jazmin Hartsell,CMA

## 2018-12-15 ENCOUNTER — Telehealth (INDEPENDENT_AMBULATORY_CARE_PROVIDER_SITE_OTHER): Payer: 59 | Admitting: Family Medicine

## 2018-12-15 DIAGNOSIS — F3160 Bipolar disorder, current episode mixed, unspecified: Secondary | ICD-10-CM | POA: Diagnosis not present

## 2018-12-15 MED ORDER — QUETIAPINE FUMARATE 50 MG PO TABS: ORAL_TABLET | ORAL | 0 refills | Status: DC

## 2018-12-15 MED ORDER — QUETIAPINE FUMARATE 50 MG PO TABS: ORAL_TABLET | ORAL | 1 refills | Status: DC

## 2018-12-15 MED ORDER — MIRTAZAPINE 15 MG PO TABS: 15.0000 mg | ORAL_TABLET | Freq: Every day | ORAL | 1 refills | Status: DC

## 2018-12-15 NOTE — Telephone Encounter (Signed)
Sent in 90 days supply. Thanks.

## 2018-12-15 NOTE — Progress Notes (Addendum)
[place] Telemedicine Visit  Patient consented to have virtual visit. Method of visit: Video  Encounter participants: Patient: Dana Knight - located at in car Provider: [place] Donel Osowski - located at home Others (if applicable): n/a  Chief Complaint: depression  HPI: Patient reports hx of Bipolar. Patient reports that she has been off work since April 11, and she is supposed to go back to work last week, her mother-in-law passed away on the 06-Oct-2022 and she's been dealing with a lot and is having trouble sleeping. Her favorite uncle passed away in the beginning of May which was also distressing. She feels very overwhelmed. Patient ran out of remeron and seroquel about 3 weeks ago.  She reports that they helped her eat and sleep and feel much better overall. She has not been eating now with all the stress. No SI/HI. Marland Kitchen   She has not gotten to follow up with Casimer Lanius as originally planned. She has not reached out to any grief counselors and is not interested at this time, she would just like to talk with Neoma Laming.   ROS: per HPI  Pertinent PMHx: Bipolar  Exam:  Respiratory: breathing comfortably Psych: Neatly groomed and appropriately dressed. cooperative and attentive. Speech is normal volume and rate. Denies SI/ HI.  Depression screen [facility] 2/9 12/15/2018 09/20/2018 04/06/2018  Decreased Interest 3 0 3  Down, Depressed, Hopeless 1 0 3  PHQ - 2 Score 4 0 6  Altered sleeping 3 - 3  Tired, decreased energy 3 - 3  Change in appetite 3 - 3  Feeling bad or failure about yourself  0 - 1  Trouble concentrating 3 - 3  Moving slowly or fidgety/restless 1 - 2  Suicidal thoughts 0 - 0  PHQ-9 Score 17 - 21  Difficult doing work/chores Somewhat difficult - Very difficult   Assessment/Plan:  Bipolar affective disorder, current episode mixed ([place]) Patient ran out of seroquel and mirtazapine about 3 weeks ago. She states that she was doing well on those medications, but  has also recently had multiple losses in her family that have made things harder for her. She denies any HI/SI.  -will route to Casimer Lanius to reach otu to patient, as patient really enjoyed talking with her -restart seroquel and mitazapine and patient counseled on importance of not taking mirtazapine alone -strict return precautions discussed and patient will reach out if she has any SI/HI     Time spent during visit with patient: 14 minutes  [place] Oluwadamilola Rosamond, DO PGY-2, [place]

## 2018-12-15 NOTE — Telephone Encounter (Signed)
Will forward to MD. Jazmin Hartsell,CMA  

## 2018-12-15 NOTE — Telephone Encounter (Signed)
Patient has not received this medication from Dr Shawna Orleans since November last year. She will need to be seen or have telephonic visit with someone before considering prescribing this atypical antipsychotic.  Please ask patient to schedule visit with me or another physician.

## 2018-12-15 NOTE — Addendum Note (Signed)
Addended by: Tabytha Gradillas, [place] J on: 12/15/2018 04:16 PM   Modules accepted: Orders

## 2018-12-15 NOTE — Assessment & Plan Note (Signed)
Patient ran out of seroquel and mirtazapine about 3 weeks ago. She states that she was doing well on those medications, but has also recently had multiple losses in her family that have made things harder for her. She denies any HI/SI.  -will route to Casimer Lanius to reach otu to patient, as patient really enjoyed talking with her -restart seroquel and mitazapine and patient counseled on importance of not taking mirtazapine alone -strict return precautions discussed and patient will reach out if she has any SI/HI

## 2018-12-21 ENCOUNTER — Other Ambulatory Visit: Payer: Self-pay

## 2018-12-21 MED ORDER — MIRTAZAPINE 15 MG PO TABS: 15.0000 mg | ORAL_TABLET | Freq: Every day | ORAL | 1 refills | Status: DC

## 2018-12-21 NOTE — Telephone Encounter (Signed)
Patient calls nurse line stating her insurance will not cover a 30 day supply of Remeron. Please send in a 90 day supply.

## 2019-03-14 ENCOUNTER — Other Ambulatory Visit: Payer: Self-pay | Admitting: Family Medicine

## 2019-03-14 DIAGNOSIS — F3160 Bipolar disorder, current episode mixed, unspecified: Secondary | ICD-10-CM

## 2019-03-16 ENCOUNTER — Other Ambulatory Visit: Payer: Self-pay

## 2019-03-16 ENCOUNTER — Telehealth (INDEPENDENT_AMBULATORY_CARE_PROVIDER_SITE_OTHER): Payer: Managed Care, Other (non HMO) | Admitting: Family Medicine

## 2019-03-16 DIAGNOSIS — J0100 Acute maxillary sinusitis, unspecified: Secondary | ICD-10-CM | POA: Diagnosis not present

## 2019-03-16 MED ORDER — ACETAMINOPHEN 500 MG PO TABS: 500.0000 mg | ORAL_TABLET | Freq: Four times a day (QID) | ORAL | 0 refills | Status: DC | PRN

## 2019-03-16 MED ORDER — PREDNISONE 20 MG PO TABS: 40.0000 mg | ORAL_TABLET | Freq: Every day | ORAL | 0 refills | Status: AC

## 2019-03-16 MED ORDER — FLUTICASONE PROPIONATE 50 MCG/ACT NA SUSP: 1.0000 | Freq: Every day | NASAL | 12 refills | Status: DC

## 2019-03-16 MED ORDER — AZITHROMYCIN 250 MG PO TABS: ORAL_TABLET | ORAL | 0 refills | Status: DC

## 2019-03-16 MED ORDER — IBUPROFEN 200 MG PO TABS: 200.0000 mg | ORAL_TABLET | Freq: Four times a day (QID) | ORAL | 2 refills | Status: DC | PRN

## 2019-03-16 NOTE — Progress Notes (Signed)
[facility] Telemedicine Visit  Patient consented to have virtual visit. Method of visit: Video  Encounter participants: Patient: Dana Knight - located at Work Provider: Daisy Floro - located at Work from [employer] (if applicable): none  Chief Complaint: cough and HA  HPI: This is a very pleasant patient calling in from work reporting a few days of terrible, irritating cough (deep from chest, nonproductive), pain in her teeth, headache, and sinus pressure. Was absent from work Friday (when symptoms started). Thought she was getting better and then it became worse again. No headache, SOB, sore throat, chest pain, N, V or diarrhea. Metallic taste in mouth. 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    ROS: per HPI  Pertinent PMHx: non-contributory  Exam:  Respiratory: normal work of breathing, speaking in full sentences  Assessment/Plan: Acute non-recurrent maxillary sinusitis Patient calling in describing cough, headache, face pain, and teeth pain, concerning for acute sinusitis.  Patient has been sick now for 6 days, symptoms improved but then worsened again. -Recommending Tylenol and Advil as needed for pain control -Prescribing prednisone steroid burst for nagging cough -Prescribing Z-Pak as patient's symptoms improved and then worsened -Fluticasone nasal spray daily -Patient declining COVID testing at this time    Time spent during visit with patient: 15:00 minutes   Milus Banister, [place], PGY-2 03/16/2019 12:22 PM

## 2019-03-16 NOTE — Assessment & Plan Note (Addendum)
Patient calling in describing cough, headache, face pain, and teeth pain, concerning for acute sinusitis.  Patient has been sick now for 6 days, symptoms improved but then worsened again. -Recommending Tylenol and Advil as needed for pain control -Prescribing prednisone steroid burst for nagging cough -Prescribing Z-Pak as patient's symptoms improved and then worsened -Fluticasone nasal spray daily -Patient declining COVID testing at this time

## 2019-04-06 ENCOUNTER — Other Ambulatory Visit: Payer: Self-pay

## 2019-04-06 ENCOUNTER — Telehealth (INDEPENDENT_AMBULATORY_CARE_PROVIDER_SITE_OTHER): Payer: Managed Care, Other (non HMO) | Admitting: Family Medicine

## 2019-04-06 DIAGNOSIS — R059 Cough, unspecified: Secondary | ICD-10-CM

## 2019-04-06 DIAGNOSIS — R05 Cough: Secondary | ICD-10-CM

## 2019-04-06 DIAGNOSIS — R519 Headache, unspecified: Secondary | ICD-10-CM

## 2019-04-06 DIAGNOSIS — T732XXD Exhaustion due to exposure, subsequent encounter: Secondary | ICD-10-CM

## 2019-04-06 DIAGNOSIS — J0100 Acute maxillary sinusitis, unspecified: Secondary | ICD-10-CM

## 2019-04-06 NOTE — Progress Notes (Signed)
  Provider location: [facility]  I connected with Dana Knight  on 04/07/19 at 10:10 AM EDT by [employer] Video Encounter at home and verified that I am speaking with the correct person using two identifiers.   I discussed the limitations, risks, security and privacy concerns of performing an evaluation and management service virtually and the availability of in person appointments. I also discussed with the patient that there may be a patient responsible charge related to this service. The patient expressed understanding and agreed to proceed. Subjective:    Patient ID: Dana Knight is a 43 y.o. female presenting with No chief complaint on file.  on 04/06/2019  HPI: Not feeling well. Seen virutally on 10/1 with similar complaints. Declined covid testing. Has several contacts at work and home who are positive. Placed on Z-pack and Flonase and continues to have same complaints. Headache, feels sluggish. Coughing has improved. She feels tired and SOB with exertion.  Review of Systems  Constitutional: Positive for fatigue. Negative for chills and fever.  Respiratory: Positive for cough and shortness of breath.   Cardiovascular: Negative for chest pain.  Gastrointestinal: Negative for abdominal pain, nausea and vomiting.  Genitourinary: Negative for dysuria.  Skin: Negative for rash.      Objective:    There were no vitals taken for this visit. General:  Alert, oriented and cooperative. Patient appears to be in no acute distress.  Mental Status: Normal mood and affect. Normal behavior. Normal judgment and thought content.   Respiratory: Normal respiratory effort, no problems with respiration noted  Rest of physical exam deferred due to type of encounter     Assessment & Plan:   Problem List Items Addressed This Visit      Unprioritized   Acute non-recurrent maxillary sinusitis    Sounds like COVID--MyChart monitoring message sent to pt--arrange for testing,  quarantining discussed, symptomatic treatment and warning signs discussed.       Other Visit Diagnoses    Cough    -  Primary   Relevant Orders   Temperature monitoring   Fatigue due to exposure, subsequent encounter       Relevant Orders   Temperature monitoring   Acute nonintractable headache, unspecified headache type       Relevant Orders   Temperature monitoring      I provided 13 minutes of face-to-face time during this encounter. Return if symptoms worsen or fail to improve.  Dana Knight 04/07/2019 10:17 AM

## 2019-04-07 ENCOUNTER — Other Ambulatory Visit: Payer: Self-pay

## 2019-04-07 ENCOUNTER — Encounter (INDEPENDENT_AMBULATORY_CARE_PROVIDER_SITE_OTHER): Payer: Self-pay

## 2019-04-07 ENCOUNTER — Telehealth: Payer: Self-pay

## 2019-04-07 DIAGNOSIS — Z20822 Contact with and (suspected) exposure to covid-19: Secondary | ICD-10-CM

## 2019-04-07 NOTE — Telephone Encounter (Signed)
Patient advise on weakness and diarrhea per protocol:  If diarrhea remains the same: encourage patient to drink oral fluids and bland foods.   Avoid alcohol, spicy foods, caffeine or fatty foods that could make diarrhea worse.   Continue to monitor for signs of dehydration (increased thirst decreased urine output, yellow urine, dry skin, headache or dizziness).   Advise patient to try OTC medication (Imodium, kaopectate, Pepto-Bismol) as per manufacturer's instructions  If worsening diarrhea occurs and becomes severe (6-7 bowel movements a day): notify PCP   If diarrhea last greater than 7 days: notify PCP   IF SIGNS OF DEHYDRATION OCCUR (INCREASED THIRST, DECREASED URINE OUTPUT, YELLOW URINE, DRY SKIN, HEADACHE OR DIZZINESS) ADVISE PATIENT TO CALL 911 AND SEEK TREATMENT IN THE ED  IF PATIENT HAS WORSENING WEAKNESS WITH INABILITY TO STAND OR IF PATIENT HAS TO HOLD ON TO SOMETHING TO GET BALANCE, ADVISE PATIENT TO CALL 911 AND SEEK TREATMENT IN ED   Patient had a appointment with her pcp and will be going to testing site today . Patient verbalized understanding and agrees with plan.

## 2019-04-07 NOTE — Assessment & Plan Note (Signed)
Sounds like COVID--MyChart monitoring message sent to pt--arrange for testing, quarantining discussed, symptomatic treatment and warning signs discussed.

## 2019-04-08 LAB — NOVEL CORONAVIRUS, NAA: SARS-CoV-2, NAA: NOT DETECTED

## 2019-04-10 ENCOUNTER — Encounter (INDEPENDENT_AMBULATORY_CARE_PROVIDER_SITE_OTHER): Payer: Self-pay

## 2019-06-21 ENCOUNTER — Encounter: Payer: Managed Care, Other (non HMO) | Admitting: Family Medicine

## 2019-06-23 ENCOUNTER — Other Ambulatory Visit (HOSPITAL_COMMUNITY)
Admission: RE | Admit: 2019-06-23 | Discharge: 2019-06-23 | Disposition: A | Discharge status: Home / Self Care | Payer: Managed Care, Other (non HMO) | Source: Ambulatory Visit | Attending: Family Medicine | Admitting: Family Medicine

## 2019-06-23 ENCOUNTER — Ambulatory Visit (INDEPENDENT_AMBULATORY_CARE_PROVIDER_SITE_OTHER): Payer: Managed Care, Other (non HMO) | Admitting: Family Medicine

## 2019-06-23 ENCOUNTER — Encounter: Payer: Self-pay | Admitting: Family Medicine

## 2019-06-23 ENCOUNTER — Other Ambulatory Visit: Payer: Self-pay

## 2019-06-23 VITALS — BP 118/68 | HR 63 | Wt 168.0 lb

## 2019-06-23 DIAGNOSIS — N760 Acute vaginitis: Secondary | ICD-10-CM | POA: Diagnosis not present

## 2019-06-23 DIAGNOSIS — F32A Depression, unspecified: Secondary | ICD-10-CM

## 2019-06-23 DIAGNOSIS — N898 Other specified noninflammatory disorders of vagina: Secondary | ICD-10-CM | POA: Diagnosis not present

## 2019-06-23 DIAGNOSIS — Z124 Encounter for screening for malignant neoplasm of cervix: Secondary | ICD-10-CM

## 2019-06-23 DIAGNOSIS — B9689 Other specified bacterial agents as the cause of diseases classified elsewhere: Secondary | ICD-10-CM

## 2019-06-23 DIAGNOSIS — J0100 Acute maxillary sinusitis, unspecified: Secondary | ICD-10-CM | POA: Diagnosis not present

## 2019-06-23 DIAGNOSIS — F329 Major depressive disorder, single episode, unspecified: Secondary | ICD-10-CM

## 2019-06-23 LAB — POCT WET PREP (WET MOUNT)
Clue Cells Wet Prep Whiff POC: POSITIVE
Trichomonas Wet Prep HPF POC: ABSENT

## 2019-06-23 MED ORDER — METRONIDAZOLE 500 MG PO TABS: 500.0000 mg | ORAL_TABLET | Freq: Two times a day (BID) | ORAL | 0 refills | Status: AC

## 2019-06-23 MED ORDER — MIRTAZAPINE 15 MG PO TABS: 15.0000 mg | ORAL_TABLET | Freq: Every day | ORAL | 0 refills | Status: DC

## 2019-06-23 MED ORDER — FLUTICASONE PROPIONATE 50 MCG/ACT NA SUSP: 1.0000 | Freq: Every day | NASAL | 3 refills | Status: DC

## 2019-06-23 NOTE — Patient Instructions (Addendum)
It was a pleasure meeting you today.  I will call you with the results if they are abnormal.  I have also refilled your Mirtazipine and Flonase.  Pschologytoday.com is a good website to connect with a professional to help you with insomnia, anxiety/depression.      Pap Test Why am I having this test? A Pap test, also called a Pap smear, is a screening test to check for signs of:  Cancer of the vagina, cervix, and uterus. The cervix is the lower part of the uterus that opens into the vagina.  Infection.  Changes that may be a sign that cancer is developing (precancerous changes). Women need this test on a regular basis. In general, you should have a Pap test every 3 years until you reach menopause or age 42. Women aged 30-60 may choose to have their Pap test done at the same time as an HPV (human papillomavirus) test every 5 years (instead of every 3 years). Your health care provider may recommend having Pap tests more or less often depending on your medical conditions and past Pap test results. What kind of sample is taken?  Your health care provider will collect a sample of cells from the surface of your cervix. This will be done using a small cotton swab, plastic spatula, or brush. This sample is often collected during a pelvic exam, when you are lying on your back on an exam table with feet in footrests (stirrups). In some cases, fluids (secretions) from the cervix or vagina may also be collected. How do I prepare for this test?  Be aware of where you are in your menstrual cycle. If you are menstruating on the day of the test, you may be asked to reschedule.  You may need to reschedule if you have a known vaginal infection on the day of the test.  Follow instructions from your health care provider about: ? Changing or stopping your regular medicines. Some medicines can cause abnormal test results, such as digitalis and tetracycline. ? Avoiding douching or taking a bath the day  before or the day of the test. Tell a health care provider about:  Any allergies you have.  All medicines you are taking, including vitamins, herbs, eye drops, creams, and over-the-counter medicines.  Any blood disorders you have.  Any surgeries you have had.  Any medical conditions you have.  Whether you are pregnant or may be pregnant. How are the results reported? Your test results will be reported as either abnormal or normal. A false-positive result can occur. A false positive is incorrect because it means that a condition is present when it is not. A false-negative result can occur. A false negative is incorrect because it means that a condition is not present when it is. What do the results mean? A normal test result means that you do not have signs of cancer of the vagina, cervix, or uterus. An abnormal result may mean that you have:  Cancer. A Pap test by itself is not enough to diagnose cancer. You will have more tests done in this case.  Precancerous changes in your vagina, cervix, or uterus.  Inflammation of the cervix.  An STD (sexually transmitted disease).  A fungal infection.  A parasite infection. Talk with your health care provider about what your results mean. Questions to ask your health care provider Ask your health care provider, or the department that is doing the test:  When will my results be ready?  How will I  get my results?  What are my treatment options?  What other tests do I need?  What are my next steps? Summary  In general, women should have a Pap test every 3 years until they reach menopause or age 42.  Your health care provider will collect a sample of cells from the surface of your cervix. This will be done using a small cotton swab, plastic spatula, or brush.  In some cases, fluids (secretions) from the cervix or vagina may also be collected. This information is not intended to replace advice given to you by your health care  provider. Make sure you discuss any questions you have with your health care provider. Document Revised: 02/08/2017 Document Reviewed: 02/08/2017 Elsevier Patient Education  2020 Else[employer] Inc.     Managing Anxiety, Adult After being diagnosed with an anxiety disorder, you may be relieved to know why you have felt or behaved a certain way. You may also feel overwhelmed about the treatment ahead and what it will mean for your life. With care and support, you can manage this condition and recover from it. How to manage lifestyle changes Managing stress and anxiety  Stress is your body's reaction to life changes and events, both good and bad. Most stress will last just a few hours, but stress can be ongoing and can lead to more than just stress. Although stress can play a major role in anxiety, it is not the same as anxiety. Stress is usually caused by something external, such as a deadline, test, or competition. Stress normally passes after the triggering event has ended.  Anxiety is caused by something internal, such as imagining a terrible outcome or worrying that something will go wrong that will devastate you. Anxiety often does not go away even after the triggering event is over, and it can become long-term (chronic) worry. It is important to understand the differences between stress and anxiety and to manage your stress effectively so that it does not lead to an anxious response. Talk with your health care provider or a counselor to learn more about reducing anxiety and stress. He or she may suggest tension reduction techniques, such as:  Music therapy. This can include creating or listening to music that you enjoy and that inspires you.  Mindfulness-based meditation. This involves being aware of your normal breaths while not trying to control your breathing. It can be done while sitting or walking.  Centering prayer. This involves focusing on a word, phrase, or sacred image that means  something to you and brings you peace.  Deep breathing. To do this, expand your stomach and inhale slowly through your nose. Hold your breath for 3-5 seconds. Then exhale slowly, letting your stomach muscles relax.  Self-talk. This involves identifying thought patterns that lead to anxiety reactions and changing those patterns.  Muscle relaxation. This involves tensing muscles and then relaxing them. Choose a tension reduction technique that suits your lifestyle and personality. These techniques take time and practice. Set aside 5-15 minutes a day to do them. Therapists can offer counseling and training in these techniques. The training to help with anxiety may be covered by some insurance plans. Other things you can do to manage stress and anxiety include:  Keeping a stress/anxiety diary. This can help you learn what triggers your reaction and then learn ways to manage your response.  Thinking about how you react to certain situations. You may not be able to control everything, but you can control your response.  Making  time for activities that help you relax and not feeling guilty about spending your time in this way.  Visual imagery and yoga can help you stay calm and relax.  Medicines Medicines can help ease symptoms. Medicines for anxiety include:  Anti-anxiety drugs.  Antidepressants. Medicines are often used as a primary treatment for anxiety disorder. Medicines will be prescribed by a health care provider. When used together, medicines, psychotherapy, and tension reduction techniques may be the most effective treatment. Relationships Relationships can play a big part in helping you recover. Try to spend more time connecting with trusted friends and family members. Consider going to couples counseling, taking family education classes, or going to family therapy. Therapy can help you and others better understand your condition. How to recognize changes in your anxiety Everyone  responds differently to treatment for anxiety. Recovery from anxiety happens when symptoms decrease and stop interfering with your daily activities at home or work. This may mean that you will start to:  Have better concentration and focus. Worry will interfere less in your daily thinking.  Sleep better.  Be less irritable.  Have more energy.  Have improved memory. It is important to recognize when your condition is getting worse. Contact your health care provider if your symptoms interfere with home or work and you feel like your condition is not improving. Follow these instructions at home: Activity  Exercise. Most adults should do the following: ? Exercise for at least 150 minutes each week. The exercise should increase your heart rate and make you sweat (moderate-intensity exercise). ? Strengthening exercises at least twice a week.  Get the right amount and quality of sleep. Most adults need 7-9 hours of sleep each night. Lifestyle   Eat a healthy diet that includes plenty of vegetables, fruits, whole grains, low-fat dairy products, and lean protein. Do not eat a lot of foods that are high in solid fats, added sugars, or salt.  Make choices that simplify your life.  Do not use any products that contain nicotine or tobacco, such as cigarettes, e-cigarettes, and chewing tobacco. If you need help quitting, ask your health care provider.  Avoid caffeine, alcohol, and certain over-the-counter cold medicines. These may make you feel worse. Ask your pharmacist which medicines to avoid. General instructions  Take over-the-counter and prescription medicines only as told by your health care provider.  Keep all follow-up visits as told by your health care provider. This is important. Where to find support You can get help and support from these sources:  Self-help groups.  Online and [employer].  A trusted spiritual leader.  Couples counseling.  Family education  classes.  Family therapy. Where to find more information You may find that joining a support group helps you deal with your anxiety. The following sources can help you locate counselors or support groups near you:  Mental Health America: www.mentalhealthamerica.net  Anxiety and Depression Association of [place] (ADAA): ProgramCam.de  [employer] Mental [employer]ness (NAMI): www.nami.org Contact a health care provider if you:  Have a hard time staying focused or finishing daily tasks.  Spend many hours a day feeling worried about everyday life.  Become exhausted by worry.  Start to have headaches, feel tense, or have nausea.  Urinate more than normal.  Have diarrhea. Get help right away if you have:  A racing heart and shortness of breath.  Thoughts of hurting yourself or others. If you ever feel like you may hurt yourself or others, or have thoughts about taking  your own life, get help right away. You can go to your nearest emergency department or call:  Your local emergency services (911 in the [place]  A suicide crisis helpline, such as the [place] at (443)105-5619. This is open 24 hours a day. Summary  Taking steps to learn and use tension reduction techniques can help calm you and help prevent triggering an anxiety reaction.  When used together, medicines, psychotherapy, and tension reduction techniques may be the most effective treatment.  Family, friends, and partners can play a big part in helping you recover from an anxiety disorder. This information is not intended to replace advice given to you by your health care provider. Make sure you discuss any questions you have with your health care provider. Document Revised: 11/01/2018 Document Reviewed: 11/01/2018 Elsevier Patient Education  [place].     Insomnia Insomnia is a sleep disorder that makes it difficult to fall asleep or stay asleep. Insomnia can cause fatigue,  low energy, difficulty concentrating, mood swings, and poor performance at work or school. There are three different ways to classify insomnia:  Difficulty falling asleep.  Difficulty staying asleep.  Waking up too early in the morning. Any type of insomnia can be long-term (chronic) or short-term (acute). Both are common. Short-term insomnia usually lasts for three months or less. Chronic insomnia occurs at least three times a week for longer than three months. What are the causes? Insomnia may be caused by another condition, situation, or substance, such as:  Anxiety.  Certain medicines.  Gastroesophageal reflux disease (GERD) or other gastrointestinal conditions.  Asthma or other breathing conditions.  Restless legs syndrome, sleep apnea, or other sleep disorders.  Chronic pain.  Menopause.  Stroke.  Abuse of alcohol, tobacco, or illegal drugs.  Mental health conditions, such as depression.  Caffeine.  Neurological disorders, such as Alzheimer's disease.  An overactive thyroid (hyperthyroidism). Sometimes, the cause of insomnia may not be known. What increases the risk? Risk factors for insomnia include:  Gender. Women are affected more often than men.  Age. Insomnia is more common as you get older.  Stress.  Lack of exercise.  Irregular work schedule or working night shifts.  Traveling between different time zones.  Certain medical and mental health conditions. What are the signs or symptoms? If you have insomnia, the main symptom is having trouble falling asleep or having trouble staying asleep. This may lead to other symptoms, such as:  Feeling fatigued or having low energy.  Feeling nervous about going to sleep.  Not feeling rested in the morning.  Having trouble concentrating.  Feeling irritable, anxious, or depressed. How is this diagnosed? This condition may be diagnosed based on:  Your symptoms and medical history. Your health care  provider may ask about: ? Your sleep habits. ? Any medical conditions you have. ? Your mental health.  A physical exam. How is this treated? Treatment for insomnia depends on the cause. Treatment may focus on treating an underlying condition that is causing insomnia. Treatment may also include:  Medicines to help you sleep.  Counseling or therapy.  Lifestyle adjustments to help you sleep better. Follow these instructions at home: Eating and drinking   Limit or avoid alcohol, caffeinated beverages, and cigarettes, especially close to bedtime. These can disrupt your sleep.  Do not eat a large meal or eat spicy foods right before bedtime. This can lead to digestive discomfort that can make it hard for you to sleep. Sleep habits  Keep a sleep diary to help you and your health care provider figure out what could be causing your insomnia. Write down: ? When you sleep. ? When you wake up during the night. ? How well you sleep. ? How rested you feel the next day. ? Any side effects of medicines you are taking. ? What you eat and drink.  Make your bedroom a dark, comfortable place where it is easy to fall asleep. ? Put up shades or blackout curtains to block light from outside. ? Use a white noise machine to block noise. ? Keep the temperature cool.  Limit screen use before bedtime. This includes: ? Watching TV. ? Using your smartphone, tablet, or computer.  Stick to a routine that includes going to bed and waking up at the same times every day and night. This can help you fall asleep faster. Consider making a quiet activity, such as reading, part of your nighttime routine.  Try to avoid taking naps during the day so that you sleep better at night.  Get out of bed if you are still awake after 15 minutes of trying to sleep. Keep the lights down, but try reading or doing a quiet activity. When you feel sleepy, go back to bed. General instructions  Take over-the-counter and  prescription medicines only as told by your health care provider.  Exercise regularly, as told by your health care provider. Avoid exercise starting several hours before bedtime.  Use relaxation techniques to manage stress. Ask your health care provider to suggest some techniques that may work well for you. These may include: ? Breathing exercises. ? Routines to release muscle tension. ? Visualizing peaceful scenes.  Make sure that you drive carefully. Avoid driving if you feel very sleepy.  Keep all follow-up visits as told by your health care provider. This is important. Contact a health care provider if:  You are tired throughout the day.  You have trouble in your daily routine due to sleepiness.  You continue to have sleep problems, or your sleep problems get worse. Get help right away if:  You have serious thoughts about hurting yourself or someone else. If you ever feel like you may hurt yourself or others, or have thoughts about taking your own life, get help right away. You can go to your nearest emergency department or call:  Your local emergency services (911 in the [place]  A suicide crisis helpline, such as the National Suicide Prevention Lifeline at 845-614-1994. This is open 24 hours a day. Summary  Insomnia is a sleep disorder that makes it difficult to fall asleep or stay asleep.  Insomnia can be long-term (chronic) or short-term (acute).  Treatment for insomnia depends on the cause. Treatment may focus on treating an underlying condition that is causing insomnia.  Keep a sleep diary to help you and your health care provider figure out what could be causing your insomnia. This information is not intended to replace advice given to you by your health care provider. Make sure you discuss any questions you have with your health care provider. Document Revised: 05/14/2017 Document Reviewed: 03/11/2017 Elsevier Patient Education  2020 [employer].

## 2019-06-25 DIAGNOSIS — Z124 Encounter for screening for malignant neoplasm of cervix: Secondary | ICD-10-CM

## 2019-06-25 HISTORY — DX: Encounter for screening for malignant neoplasm of cervix: Z12.4

## 2019-06-25 NOTE — Progress Notes (Signed)
  Patient Name: Dana Knight Date of Birth: June 16, 1975 Date of Visit: 06/23/2019 PCP: Dana Allan, MD  Chief Complaint: need Pap and med refill  Subjective: [name] is a pleasant 44 y.o. with medical history significant for Insomnia, Bipolar presenting today for Pap smear and medication refill.   Health Maintenance Last PAP 2017 wnl.  Pt reports no abnormal vaginal bleeding but does endorse recent vaginal foul smelling discharge.Sexually active with 1 female partner and using IUD for contraception.  Depression Stable with Remeron.  No suicidal or homicidal ideation.   ROS: Per HPI.   I have reviewed the patient's medical, surgical, family, and social history as appropriate.  Vitals:   06/23/19 1109  BP: 118/68  Pulse: 63  SpO2: 96%    Pelvic exam:  No adnexal masses or cervical tenderness on palpation SVE: Cervix friable, no lesions noted, IUD strings visible, vaginal vault normal with minimal discharge  Pap smear for cervical cancer screening History LSIL, last PAP normal. -PAP test completed and sent for cytology -HPV sent -GC/C sent -Wet mount positive for BV -Flagyl 500mg  BID x7/7 -Repeat 3 years pending results  Depression Stable -Continue Remeron -encourage counseling, psychologytoday.com website given to patient  Return to care as needed.   , MD  Family [facility] Teaching Service

## 2019-06-25 NOTE — Assessment & Plan Note (Addendum)
History LSIL, last PAP normal. -PAP test completed and sent for cytology -HPV sent -GC/C sent -Wet mount positive for BV -Flagyl 500mg  BID x7/7 -Repeat 3 years pending results

## 2019-06-30 LAB — CYTOLOGY - PAP
Comment: NEGATIVE
Diagnosis: NEGATIVE
Diagnosis: REACTIVE
High risk HPV: NEGATIVE

## 2019-08-01 ENCOUNTER — Ambulatory Visit: Payer: Managed Care, Other (non HMO) | Admitting: Family Medicine

## 2019-10-03 ENCOUNTER — Encounter: Payer: Self-pay | Admitting: Family Medicine

## 2019-12-01 ENCOUNTER — Other Ambulatory Visit: Payer: Self-pay | Admitting: Family Medicine

## 2019-12-01 DIAGNOSIS — F32A Depression, unspecified: Secondary | ICD-10-CM

## 2019-12-14 ENCOUNTER — Telehealth: Payer: Self-pay | Admitting: Family Medicine

## 2019-12-14 NOTE — Telephone Encounter (Signed)
Pt is calling and would like to have a referral to see and ortho doctor. She said that her hand is hurting from an injury ten years ago. Please call to discuss. jw

## 2019-12-15 NOTE — Telephone Encounter (Signed)
She will need to book an appointment in to be seen in clinic for assessment. Thank you  Dana Allan, MD [facility]

## 2019-12-19 NOTE — Telephone Encounter (Signed)
Pt has appointment scheduled with PCP on 12/20/2019. Dana Knight, CMA

## 2019-12-19 NOTE — Patient Instructions (Addendum)
Thank you for coming to see me today. It was a pleasure. Today we talked about:  Right hand pain  Start Naproxen twice daily, DO NOT TAKE any Ibuprofen or other products containing NSAIDS while taking Naproxen Tylenol twice a day Hand brace at night  Xray hand, I will call you if abnormal if not we can discuss at next visit.  Please follow-up with PCP in 1-2 weeks  Please schedule an appointment with your psychiatrist. I sent a referral to [facility] to help with anxiety and depression management   If you have any questions or concerns, please do not hesitate to call the office at [phone].  Best,   Dana Allan, MD Family Medicine Residency    Tendinitis  Tendinitis is inflammation of a tendon. A tendon is a strong cord of tissue that connects muscle to bone. Tendinitis can affect any tendon, but it most commonly affects the:  Shoulder tendon (rotator cuff).  Ankle tendon (Achilles tendon).  Elbow tendon (triceps tendon).  Tendons in the wrist. What are the causes? This condition may be caused by:  Overusing a tendon or muscle. This is common.  Age-related wear and tear.  Injury.  Inflammatory conditions, such as arthritis.  Certain medicines. What increases the risk? You are more likely to develop this condition if you do activities that involve the same movements over and over again (repetitive motions). What are the signs or symptoms? Symptoms of this condition may include:  Pain.  Tenderness.  Mild swelling.  Decreased range of motion. How is this diagnosed? This condition is diagnosed with a physical exam. You may also have tests, such as:  Ultrasound. This uses sound waves to make an image of the inside of your body in the affected area.  MRI. How is this treated? This condition may be treated by resting, icing, applying pressure (compression), and raising (elevating) the affected area above the level of your heart. This is known as RICE therapy.  Treatment may also include:  Medicines to help reduce inflammation or to help reduce pain.  Exercises or physical therapy to strengthen and stretch the tendon.  A brace or splint.  Surgery. This is rarely needed. Follow these instructions at home: If you have a splint or brace:  Wear the splint or brace as told by your health care provider. Remove it only as told by your health care provider.  Loosen the splint or brace if your fingers or toes tingle, become numb, or turn cold and blue.  Keep the splint or brace clean.  If the splint or brace is not waterproof: ? Do not let it get wet. ? Cover it with a watertight covering when you take a bath or shower. Managing pain, stiffness, and swelling  If directed, put ice on the affected area. ? If you have a removable splint or brace, remove it as told by your health care provider. ? Put ice in a plastic bag. ? Place a towel between your skin and the bag. ? Leave the ice on for 20 minutes, 2-3 times a day.  Move the fingers or toes of the affected limb often, if this applies. This can help to prevent stiffness and lessen swelling.  If directed, raise (elevate) the affected area above the level of your heart while you are sitting or lying down.  If directed, apply heat to the affected area before you exercise. Use the heat source that your health care provider recommends, such as a moist heat pack or  a heating pad.     ? Place a towel between your skin and the heat source. ? Leave the heat on for 20-30 minutes. ? Remove the heat if your skin turns bright red. This is especially important if you are unable to feel pain, heat, or cold. You may have a greater risk of getting burned. Driving  Do not drive or use heavy machinery while taking prescription pain medicine.  Ask your health care provider when it is safe to drive if you have a splint or brace on any part of your arm or leg. Activity  Rest the affected area as told by your  health care provider.  Return to your normal activities as told by your health care provider. Ask your health care provider what activities are safe for you.  Avoid using the affected area while you are experiencing symptoms of tendinitis.  Do exercises as told by your health care provider. General instructions  If you have a splint, do not put pressure on any part of the splint until it is fully hardened. This may take several hours.  Wear an elastic bandage or compression wrap only as told by your health care provider.  Take over-the-counter and prescription medicines only as told by your health care provider.  Keep all follow-up visits as told by your health care provider. This is important. Contact a health care provider if:  Your symptoms do not improve.  You develop new, unexplained problems, such as numbness in your hands. Summary  Tendinitis is inflammation of a tendon.  You are more likely to develop this condition if you do activities that involve the same movements over and over again.  This condition may be treated by resting, icing, applying pressure (compression), and elevating the area above the level of your heart. This is known as RICE therapy.  Avoid using the affected area while you are experiencing symptoms of tendinitis. This information is not intended to replace advice given to you by your health care provider. Make sure you discuss any questions you have with your health care provider. Document Revised: 12/07/2017 Document Reviewed: 10/20/2017 Elsevier Patient Education  2020 [employer].   Therapy and Counseling Resources Most providers on this list will take Medicaid. Patients with commercial insurance or Medicare should contact their insurance company to get a list of in network providers.  [facility] [address], [address]  [place], [place] [address]       [phone]     [facility] [address] [place], [place]  [phone]    Jovita Kussmaul [facility] [address], [place], [place] [phone]  [facility]:  [address] [place] [phone]  [facility]:  [address], [place] [phone]  [facility] (under & uninsured) [address]   [place] [phone]    [email or web address]    [facility] -[address]     Phone:  [phone]     [facility]-  [address]  [phone]   [facility] [address]      [place], [place] [phone]  [facility]: [address] [place], [place], [place]  [phone]   [facility] (Spanish therapist) [address] [place]  [address]   [place] [address]    [phone]    [facility]   [address],  [place], [place]  [phone]   [facility]  [address] [place]  [phone]  [facility]  [address] [place], [place]        [phone]  [facility] under & uninsured [place],  [address], [place] [phone]):  Mon - Fri from 8 AM - 3 PM  [facility] [address],  (Spanish)   [address], [place]: [phone]) 8:30 - 12; 1 - 2:30  [facility] [employer],  [address] [place], [place]    ([phone]):8:30 - 12; 2 - 3PM  [facility] [employer],  [address]  [place]; [phone]):   Mon - Fri 8 AM - 5 PM  [facility] [address] [place]  MWF 12:30 to 3:00 or call to schedule an appointment  [phone]  Specific Provider options Psychology Today  [email or web address]/us 1. click on find a therapist  2. enter your zip code 3. left side and select or tailor a therapist for your specific need.   [facility] Provider  Directory [email or web address]  (Medicaid)   Follow all drop down to find a provider  Social Support program [facility] [place] [phone] or [employer] [address], [place], [place] Recovery support and educational   In home counseling [facility] Telephone: [phone]  office in [place] [email or web address]   Does not take reg. Medicaid or Medicare private insurance [facility], [place] health Choice, [place], [place], [place], [place], [place] Health Choice  24- Hour Availability:   [place] [facility]   [phone] or [phone]   [facility] Service of the [facility] [phone]  [facility]  [phone]    [facility]  [phone] (after hours)   Therapeutic Alternative/Mobile Crisis   [phone]   [phone] National Suicide Hotline  (986)465-2989 1-275-170-0174)   Call 911 or go to emergency room   [facility]  [phone]);  [place] and ([phone]  [phone]); [place], [place], [place], [place], [place], [place], [place]

## 2019-12-19 NOTE — Progress Notes (Signed)
    SUBJECTIVE:   CHIEF COMPLAINT / HPI: Right hand pain  Right Hand Pain  With his right hand pain is been ongoing for years.  In 2012 she had right hand injury secondary to dog bite.  Since that time she has had issues with her right hand but over the past few months has been getting worse.  She reports using a brace that sometimes helps.  She also reports that when her significant other massages her hand it feels a bit better.  She reports pain in the middle of her right hand on the palmar surface with shooting pain going up the arm.  She describes the pain as tingling like a wound is healing.  She works as a Insurance underwriter with computers and a lot of repetitive movement with her pain and.  She has taken Tylenol and ibuprofen intermittently which does not seem to help.  Denies any recent trauma, fevers or swelling.  Depression Patient denies any suicidal or homicidal ideation.  She takes Remeron as needed.  She reports being fatigue more lately.  Of note she does work early a.m. shift as well as having a lot of outside responsibilities.  She has not yet established a relationship with a psychiatrist.  Reports that she spoke with Ms. Sammuel Hines social worker but has admitted that she has not continued given busy lifestyle. PHQ 9 today 15.  PERTINENT  PMH / PSH:  Previous right hand injury Bipolar affective disorder  OBJECTIVE:   BP 104/76   Pulse 66   Ht 5\' 5"  (1.651 m)   Wt 164 lb (74.4 kg)   SpO2 100%   BMI 27.29 kg/m   General: Alert and oriented, no apparent distresss MSK: Upper extremity strength 5/5 bilaterally, Lower extremity strength 5/5 bilaterally Right hand: ROM within normal limits.  No thenar atrophy, negative Tinel's and Phalen's test.  Pulses present.,  Good strength, no decrease in sensation, tenderness to palpation over thenar area.  Psych: Behavior and speech appropriate to situation, no suicidal ideation or homicidal ideation  ASSESSMENT/PLAN:    Hand pain, right Considered in differentials carpal tunnel syndrome, fractures but most likely pain secondary to osteoarthritis from overuse. -X-ray right hand -Naproxen 500 mg twice daily x14/7 -Tylenol CR 1300 mg twice daily x14/7 -Hand brace at night -Follow-up with PCP as needed  Bipolar affective disorder, current episode mixed (HCC) No suicidal/homicidal thoughts or plan.  Patient reports have some fatigue and a lot of ongoing stressors at home and work.  Currently stable. -Advised to seek out appointment with psychiatrist -Referral to [facility] for management with depression and anxiety techniques. -Resources provided for mental health providers and 24-hour crisis line -Follow-up 1 to 2 weeks with PCP.  Healthcare maintenance Hepatitis C screening blood work today     10-29-2002, MD [facility]

## 2019-12-20 ENCOUNTER — Ambulatory Visit (INDEPENDENT_AMBULATORY_CARE_PROVIDER_SITE_OTHER): Payer: 59 | Admitting: Family Medicine

## 2019-12-20 ENCOUNTER — Other Ambulatory Visit: Payer: Self-pay

## 2019-12-20 ENCOUNTER — Ambulatory Visit
Admission: RE | Admit: 2019-12-20 | Discharge: 2019-12-20 | Disposition: A | Discharge status: Home / Self Care | Payer: 59 | Source: Ambulatory Visit | Attending: Family Medicine | Admitting: Family Medicine

## 2019-12-20 ENCOUNTER — Encounter: Payer: Self-pay | Admitting: Family Medicine

## 2019-12-20 VITALS — BP 104/76 | HR 66 | Ht 65.0 in | Wt 164.0 lb

## 2019-12-20 DIAGNOSIS — F329 Major depressive disorder, single episode, unspecified: Secondary | ICD-10-CM

## 2019-12-20 DIAGNOSIS — M79641 Pain in right hand: Secondary | ICD-10-CM

## 2019-12-20 DIAGNOSIS — F3160 Bipolar disorder, current episode mixed, unspecified: Secondary | ICD-10-CM | POA: Diagnosis not present

## 2019-12-20 DIAGNOSIS — Z0000 Encounter for general adult medical examination without abnormal findings: Secondary | ICD-10-CM | POA: Diagnosis not present

## 2019-12-20 MED ORDER — NAPROXEN 500 MG PO TABS: 500.0000 mg | ORAL_TABLET | Freq: Two times a day (BID) | ORAL | 0 refills | Status: AC

## 2019-12-20 MED ORDER — ACETAMINOPHEN ER 650 MG PO TBCR: 1300.0000 mg | EXTENDED_RELEASE_TABLET | Freq: Two times a day (BID) | ORAL | 0 refills | Status: AC

## 2019-12-20 MED ORDER — FUTURO RIGHT HAND WRIST BRACE MISC: 0 refills | Status: DC

## 2019-12-21 LAB — HCV INTERPRETATION

## 2019-12-21 LAB — HCV AB W REFLEX TO QUANT PCR: HCV Ab: <0.1 s/co ratio (ref 0.0–0.9)

## 2019-12-22 ENCOUNTER — Encounter: Payer: Self-pay | Admitting: Family Medicine

## 2019-12-23 ENCOUNTER — Encounter: Payer: Self-pay | Admitting: Family Medicine

## 2019-12-23 DIAGNOSIS — M79641 Pain in right hand: Secondary | ICD-10-CM | POA: Insufficient documentation

## 2019-12-23 DIAGNOSIS — Z0000 Encounter for general adult medical examination without abnormal findings: Secondary | ICD-10-CM

## 2019-12-23 HISTORY — DX: Encounter for general adult medical examination without abnormal findings: Z00.00

## 2019-12-23 NOTE — Assessment & Plan Note (Signed)
Considered in differentials carpal tunnel syndrome, fractures but most likely pain secondary to osteoarthritis from overuse. -X-ray right hand -Naproxen 500 mg twice daily x14/7 -Tylenol CR 1300 mg twice daily x14/7 -Hand brace at night -Follow-up with PCP as needed

## 2019-12-23 NOTE — Assessment & Plan Note (Addendum)
Hepatitis C screening blood work today

## 2019-12-23 NOTE — Assessment & Plan Note (Signed)
No suicidal/homicidal thoughts or plan.  Patient reports have some fatigue and a lot of ongoing stressors at home and work.  Currently stable. -Advised to seek out appointment with psychiatrist -Referral to [facility] for management with depression and anxiety techniques. -Resources provided for mental health providers and 24-hour crisis line -Follow-up 1 to 2 weeks with PCP.

## 2019-12-27 ENCOUNTER — Telehealth: Payer: Self-pay | Admitting: Family Medicine

## 2019-12-27 NOTE — Chronic Care Management (AMB) (Signed)
  Care Management   Note  12/27/2019 Name: Dana Knight MRN: [id number] DOB: Mar 21, 1976  Dana Knight is a 44 y.o. year old female who is a primary care patient of Dana Allan, MD. I reached out to Dana Knight by phone today in response to a referral sent by Dana Knight's health plan.    Dana Knight was given information about care management services today including:  1. Care management services include personalized support from designated clinical staff supervised by her physician, including individualized plan of care and coordination with other care providers 2. 24/7 contact phone numbers for assistance for urgent and routine care needs. 3. The patient may stop care management services at any time by phone call to the office staff.  Patient agreed to services and verbal consent obtained.   Follow up plan: Telephone appointment with care management team member scheduled for: 01/03/2020  [facility] Guide, Embedded Care Coordination [facility]  [place], [place] [address] Direct Dial: [phone] [email or web address]: [email or web address]

## 2020-01-03 ENCOUNTER — Telehealth: Payer: 59

## 2020-01-03 ENCOUNTER — Other Ambulatory Visit: Payer: Self-pay

## 2020-01-03 ENCOUNTER — Ambulatory Visit: Payer: Self-pay | Admitting: Licensed Clinical Social Worker

## 2020-01-03 NOTE — Chronic Care Management (AMB) (Signed)
    Clinical Social Work  Care Management Outreach   01/03/2020 Name: Dana Knight MRN: [id number] DOB: 06/26/1975  Dana Knight is a 44 y.o. year old female who is a primary care patient of Dana Allan, MD .  The Care Management team was consulted for assistance with Mental Health Counseling and connecting with psychiatry.  LCSW reached out to [name] today by phone to introduce self, assess needs and offer Care Management services and interventions.  Patient unable to talk long and wants to reschedule phone appointment   Plan: Patient will review her insurance find out if she needs a referral and ask for a list of in-network providers.   phone appointment rescheduled for Friday 01/05/2020  Review of patient status, including review of consultants reports, relevant laboratory and other test results, and collaboration with appropriate care team members and the patient's provider was performed as part of comprehensive patient evaluation and provision of care management services.    Sammuel Hines, LCSW Care Management & Coordination  [facility] / [facility]   [phone] 2:30 PM

## 2020-01-04 NOTE — Progress Notes (Deleted)
    SUBJECTIVE:   CHIEF COMPLAINT / HPI:   ***  PERTINENT  PMH / PSH: ***  OBJECTIVE:   There were no vitals taken for this visit.  ***  ASSESSMENT/PLAN:   No problem-specific Assessment & Plan notes found for this encounter.     Kerria Sapien, MD [facility]  

## 2020-01-05 ENCOUNTER — Ambulatory Visit: Payer: 59 | Admitting: Licensed Clinical Social Worker

## 2020-01-05 ENCOUNTER — Ambulatory Visit: Payer: 59 | Admitting: Family Medicine

## 2020-01-05 ENCOUNTER — Other Ambulatory Visit: Payer: Self-pay

## 2020-01-05 DIAGNOSIS — Z139 Encounter for screening, unspecified: Secondary | ICD-10-CM

## 2020-01-05 DIAGNOSIS — F439 Reaction to severe stress, unspecified: Secondary | ICD-10-CM

## 2020-01-05 NOTE — Chronic Care Management (AMB) (Signed)
Care Management   Clinical Social Work initial Note  01/05/2020 Name: Dana Knight MRN: [id number] DOB: May 31, 1976 Dana Knight is a 44 y.o. year old female who sees Dana Allan, MD for primary care. The Care Management team was consulted by PCP to assist the patient with connecting to provider to manage symptoms of anxiety and depression .  LCSW reached out to [name] today by phone to introduce self, assess needs and barriers to care.   Assessment: Patient is pleasant and engaged in conversation however presented with many distractions.  Experiencing difficulty with managing symptoms of anxiety and depression which seems to be exacerbated by work and home stressors. Not able to accomplish a lot during this encounter.        Plan: Another phone appointment scheduled July 27th at 10:00 when patient gets off of work.  Review of patient status, including review of consultants reports, relevant laboratory and other test results, and collaboration with appropriate care team members and the patient's provider was performed as part of comprehensive patient evaluation and provision of chronic care management services.    Advance Directive Status:  Not addressed in this encounter SDOH (Social Determinants of Health) assessments performed: Yes:   SDOH Interventions     Most Recent Value  SDOH Interventions  SDOH Interventions for the Following Domains Depression  Depression Interventions/Treatment  Counseling, Currently on Treatment      ; Goals Addressed            This Visit's Progress   . Anxiety and Depression managment       CARE PLAN ENTRY (see longitudinal plan of care for additional care plan information)  Current Barriers:  . Patient acknowledges deficits with connecting to mental health provider to manage symptoms of depression and anxiety.  . Patient is experiencing symptoms of  anxiety and depression which seem to be exacerbated by life stressors.     . Patient needs  Support, Education, and Care Coordination in order to meet unmet mental health needs  Clinical Social Work Goal(s):  Marland Kitchen Over the next 30 days, patient will work with SW to connect for ongoing counseling.  Interventions:  . Assessed patient's  previous treatment, needs and barriers to care . Provided basic mental health support, education and assessment . Reviewed mental health medications with patient prescribed by PCP and discussed compliance  . Other interventions include: Motivational Interviewing  Patient Self Care Activities & Deficits:  . Patient is unable to independently navigate community resource options without care coordination support . Patient is motivated for treatment Initial goal documentation        Outpatient Encounter Medications as of 01/05/2020  Medication Sig Note  . azithromycin (ZITHROMAX Z-PAK) 250 MG tablet Take 2 tablets on day 1 (10/1) followed by 1 tablet daily for the next 4 days (10/2-10/5).   Clinical research associate Bandages & Supports (FUTURO RIGHT HAND WRIST BRACE) MISC Use at night   . fluticasone (FLONASE) 50 MCG/ACT nasal spray Place 1 spray into both nostrils daily. 1 spray in each nostril every day   . ibuprofen (ADVIL) 200 MG tablet Take 1 tablet (200 mg total) by mouth every 6 (six) hours as needed for fever, headache or moderate pain.   Marland Kitchen levonorgestrel (MIRENA) 20 MCG/24HR IUD 1 each by Intrauterine route once. 02/05/2018: replaced November 2018  . mirtazapine (REMERON) 15 MG tablet TAKE 1 TABLET BY MOUTH EVERYDAY AT BEDTIME   . QUEtiapine (SEROQUEL) 50 MG tablet TAKE 1 TABLET BY MOUTH  EVERYDAY AT BEDTIME    No facility-administered encounter medications on file as of 01/05/2020.      Sammuel Hines, LCSW Care Management & Coordination  [facility] / [facility]   [phone] 10:27 AM

## 2020-01-09 ENCOUNTER — Other Ambulatory Visit: Payer: Self-pay

## 2020-01-09 ENCOUNTER — Ambulatory Visit: Payer: 59 | Admitting: Licensed Clinical Social Worker

## 2020-01-09 DIAGNOSIS — F439 Reaction to severe stress, unspecified: Secondary | ICD-10-CM

## 2020-01-09 DIAGNOSIS — Z7189 Other specified counseling: Secondary | ICD-10-CM

## 2020-01-09 NOTE — Chronic Care Management (AMB) (Signed)
Care Management   Clinical Social Work Follow Up   01/09/2020 Name: Dana Knight MRN: [id number] DOB: Jun 18, 1975 Referred by: Dana Allan, MD  Reason for referral : Care Coordination (depression/ anxiety)  Dana Knight is a 44 y.o. year old female who is a primary care patient of Dana Allan, MD.  Reason for follow-up: assess for barriers and progress with connecting to mental health provider for counseling and medication managment .   Assessment: Patient continues to experience difficulty with managing at work and home. Patient is not making progress towards goal of connecting with provider. She continue to feel overwhelmed.  Recommendation: Patient may benefit from and is in agreement for an evaluation for new medication and a full evaluation of her mental health diagnosis   Plan: Patient would like continued follow-up from CCM LCSW. F/U call in one week  Advance Directive Status: ; not addressed during this encounter.  SDOH (Social Determinants of Health) assessments performed: Yes ;  SDOH Interventions     Most Recent Value  SDOH Interventions  Depression Interventions/Treatment  Referral to Psychiatry, Counseling      Goals Addressed            This Visit's Progress   . Anxiety and Depression managment       CARE PLAN ENTRY (see longitudinal plan of care for additional care plan information)  Current Barriers:  . Patient acknowledges deficits with connecting to mental health provider to manage symptoms of depression and anxiety.  . Patient is experiencing symptoms of  anxiety and depression which seem to be exacerbated by life stressors.     . Patient needs Support, Education, and Care Coordination in order to meet unmet mental health needs  . Difficulty with focus, forgetting things, getting worse over past 5 years, impacting work and home Clinical Social Work Goal(s):  Marland Kitchen Over the next 30 days, patient will work with SW to connect for ongoing counseling.    Interventions:  . Assessed patient's  previous treatment, needs and barriers to care . Provided basic mental health support, education and interventions ( EMMI education provided- Relieving Stress and Breathing to Relax) . Discussed several options for long term counseling based on need and insurance. Assisted patient with narrowing the options down to ([facility] [phone]) . Assisted patient with making call to schedule appointment.  Waiting for return call . Reviewed mental health medications with patient prescribed by PCP and discussed compliance ( not taking medication daily) . Other interventions include: Motivational Interviewing ,Psychoeducation and/or Health Education,Solution-Focused Strategies Patient Self Care Activities & Deficits:  . Patient is unable to independently navigate community resource options without care coordination support . Patient is able to implement clinical interventions discussed today  . Patient is motivated for treatment Please see past updates related to this goal by clicking on the "Past Updates" button in the selected goal         Outpatient Encounter Medications as of 01/09/2020  Medication Sig Note  . azithromycin (ZITHROMAX Z-PAK) 250 MG tablet Take 2 tablets on day 1 (10/1) followed by 1 tablet daily for the next 4 days (10/2-10/5).   Clinical research associate Bandages & Supports (FUTURO RIGHT HAND WRIST BRACE) MISC Use at night   . fluticasone (FLONASE) 50 MCG/ACT nasal spray Place 1 spray into both nostrils daily. 1 spray in each nostril every day   . ibuprofen (ADVIL) 200 MG tablet Take 1 tablet (200 mg total) by mouth every 6 (six) hours as needed for  fever, headache or moderate pain.   Marland Kitchen levonorgestrel (MIRENA) 20 MCG/24HR IUD 1 each by Intrauterine route once. 02/05/2018: replaced November 2018  . mirtazapine (REMERON) 15 MG tablet TAKE 1 TABLET BY MOUTH EVERYDAY AT BEDTIME   . QUEtiapine (SEROQUEL) 50 MG tablet TAKE 1 TABLET BY  MOUTH EVERYDAY AT BEDTIME    No facility-administered encounter medications on file as of 01/09/2020.   Review of patient status, including review of consultants reports, relevant laboratory and other test results, and collaboration with appropriate care team members and the patient's provider was performed as part of comprehensive patient evaluation and provision of care management services.    Sammuel Hines, LCSW Care Management & Coordination  [facility] / [facility]   [phone] 12:15 PM

## 2020-01-15 ENCOUNTER — Ambulatory Visit: Payer: 59 | Admitting: Family Medicine

## 2020-01-16 ENCOUNTER — Telehealth: Payer: 59

## 2020-01-16 ENCOUNTER — Telehealth: Payer: Self-pay | Admitting: Licensed Clinical Social Worker

## 2020-01-16 ENCOUNTER — Other Ambulatory Visit: Payer: Self-pay

## 2020-01-16 NOTE — Chronic Care Management (AMB) (Signed)
    Clinical Social Work  Care Management Outreach   01/16/2020 Name: Dana Knight MRN: [id number] DOB: 1975/07/12  Dana Knight is a 44 y.o. year old female who is a primary care patient of Dana Allan, MD .  The Care Management team was consulted for assistance with Mental Health Counseling and Resources.   F/U call to Dana Knight to see if she was able to connect to resources discussed. Patient was at work and unable to talk.  Asked LCSW to call back.  Called back.  The outreach was unsuccessful. Unable to leave a message do to voice message not being set up.  Plan: if no return call is received will ask CCM care guide to contact patient to see if she is interested in rescheduling the phone appointment.  Review of patient status, including review of consultants reports, relevant laboratory and other test results, and collaboration with appropriate care team members and the patient's provider was performed as part of comprehensive patient evaluation and provision of care management services.    Sammuel Hines, LCSW Care Management & Coordination  [facility] / [facility]   [phone] 10:51 AM

## 2020-01-26 ENCOUNTER — Telehealth: Payer: Self-pay | Admitting: *Deleted

## 2020-01-26 NOTE — Chronic Care Management (AMB) (Signed)
  Care Management   Note  01/26/2020 Name: Dana Knight MRN: [id number] DOB: Jan 06, 1976  Dana Knight is a 44 y.o. year old female who is a primary care patient of Dana Allan, MD and is actively engaged with the care management team. I reached out to [name] by phone today to assist with re-scheduling a follow up visit with the Licensed Clinical Social Worker.  Follow up plan: Unsuccessful telephone outreach attempt made.The care management team will reach out to the patient again over the next 7-14 days. If patient returns call to provider office, please advise to call Embedded Care Management Care Guide Gwenevere Ghazi at [phone].  Gwenevere Ghazi  Care Guide, Embedded Care Coordination [facility] Management

## 2020-02-05 ENCOUNTER — Ambulatory Visit: Payer: 59 | Admitting: Family Medicine

## 2020-02-07 NOTE — Chronic Care Management (AMB) (Signed)
  Care Management   Note  02/07/2020 Name: [name] MRN: [id number] DOB: 1975/07/16  Dana Knight is a 44 y.o. year old female who is a primary care patient of Dana Allan, MD and is actively engaged with the care management team. I reached out to [name] by phone today to assist with re-scheduling a follow up visit with the Licensed Clinical Social Worker.  Follow up plan: Unsuccessful telephone outreach attempt made. The care management team will reach out to the patient again over the next 7 days. If patient returns call to provider office, please advise to call Embedded Care Management Care Guide Gwenevere Ghazi at [phone].  Gwenevere Ghazi  Care Guide, Embedded Care Coordination [facility] Management

## 2020-02-14 NOTE — Chronic Care Management (AMB) (Signed)
  Care Management   Note  02/14/2020 Name: Dana Knight MRN: [id number] DOB: Aug 23, 1975  Dana Knight is a 44 y.o. year old female who is a primary care patient of Dana Allan, MD and is actively engaged with the care management team. I reached out to Dana Knight by phone today to assist with re-scheduling a follow up visit with the Licensed Clinical Social Worker  Follow up plan: Telephone appointment with care management team member scheduled for: 02/22/2020  Dana Knight  Care Guide, Embedded Care Coordination [facility]  Care Management

## 2020-02-21 ENCOUNTER — Other Ambulatory Visit: Payer: Self-pay

## 2020-02-21 ENCOUNTER — Ambulatory Visit
Admission: EM | Admit: 2020-02-21 | Discharge: 2020-02-21 | Disposition: A | Discharge status: Home / Self Care | Payer: 59 | Attending: Physician Assistant | Admitting: Physician Assistant

## 2020-02-21 DIAGNOSIS — R05 Cough: Secondary | ICD-10-CM | POA: Diagnosis not present

## 2020-02-21 DIAGNOSIS — Z1152 Encounter for screening for COVID-19: Secondary | ICD-10-CM

## 2020-02-21 DIAGNOSIS — R059 Cough, unspecified: Secondary | ICD-10-CM

## 2020-02-21 DIAGNOSIS — J029 Acute pharyngitis, unspecified: Secondary | ICD-10-CM

## 2020-02-21 DIAGNOSIS — J3489 Other specified disorders of nose and nasal sinuses: Secondary | ICD-10-CM

## 2020-02-21 MED ORDER — IPRATROPIUM BROMIDE 0.06 % NA SOLN
2.0000 | Freq: Four times a day (QID) | NASAL | 0 refills | Status: DC
Start: 2020-02-21 — End: 2020-12-08

## 2020-02-21 MED ORDER — BENZONATATE 200 MG PO CAPS
200.0000 mg | ORAL_CAPSULE | Freq: Three times a day (TID) | ORAL | 0 refills | Status: DC
Start: 2020-02-21 — End: 2020-04-02

## 2020-02-21 MED ORDER — FLUTICASONE PROPIONATE 50 MCG/ACT NA SUSP
2.0000 | Freq: Every day | NASAL | 0 refills | Status: DC
Start: 2020-02-21 — End: 2020-12-08

## 2020-02-21 NOTE — ED Provider Notes (Signed)
[facility]    CSN: [id number] Arrival date & time: 02/21/20  5027      History   Chief Complaint Chief Complaint  Patient presents with  . Cough    HPI Dana Knight is a 44 y.o. female.   44 year old female comes in for 5 day of URI symptoms. Cough, sore throat, nasal congestion, headaches. Denies fever, chills, body aches. Denies abdominal pain, nausea, vomiting, diarrhea. Denies shortness of breath, loss of taste/smell. Never smoker. otc cold medicine without relief. Son with positive COVID exposure and currently with symptoms.      Past Medical History:  Diagnosis Date  . Abnormal Pap smear   . Anxiety   . History of chlamydia   . HSV (herpes simplex virus) anogenital infection   . No pertinent past medical history     Patient Active Problem List   Diagnosis Date Noted  . Hand pain, right 12/23/2019  . Healthcare maintenance 12/23/2019  . Pap smear for cervical cancer screening 06/25/2019  . Acute non-recurrent maxillary sinusitis 03/16/2019  . Pelvic cramping 09/21/2018  . Bipolar affective disorder, current episode mixed (HCC) 04/06/2018  . Bacterial vaginosis 06/22/2017  . Loss of weight 11/30/2016  . Insomnia 04/21/2013  . ABNORMAL PAP SMEAR, LGSIL 02/01/2007    Past Surgical History:  Procedure Laterality Date  . COLPOSCOPY  2010  . DILATION AND CURETTAGE OF UTERUS    . NO PAST SURGERIES      OB History    Gravida  5   Para  3   Term  3   Preterm  0   AB  2   Living  3     SAB  0   TAB  0   Ectopic  0   Multiple  0   Live Births  3            Home Medications    Prior to Admission medications   Medication Sig Start Date End Date Taking? Authorizing Provider  benzonatate (TESSALON) 200 MG capsule Take 1 capsule (200 mg total) by mouth every 8 (eight) hours. 02/21/20   Cathie Hoops, Shiori Adcox V, PA-C  fluticasone (FLONASE) 50 MCG/ACT nasal spray Place 2 sprays into both nostrils daily. 02/21/20   Cathie Hoops, Markee Remlinger V, PA-C  ibuprofen  (ADVIL) 200 MG tablet Take 1 tablet (200 mg total) by mouth every 6 (six) hours as needed for fever, headache or moderate pain. 03/16/19   Peggyann Shoals C, DO  ipratropium (ATROVENT) 0.06 % nasal spray Place 2 sprays into both nostrils 4 (four) times daily. 02/21/20   Belinda Fisher, PA-C  levonorgestrel (MIRENA) 20 MCG/24HR IUD 1 each by Intrauterine route once.    [provider]    Family History Family History  Problem Relation Age of Onset  . Stroke Mother   . Diabetes Father   . Thyroid disease Maternal Aunt   . Diabetes Maternal Grandmother     Social History Social History   Tobacco Use  . Smoking status: Never Smoker  . Smokeless tobacco: Never Used  Substance Use Topics  . Alcohol use: Yes    Comment: occasional  . Drug use: No     Allergies   Diflucan [fluconazole]   Review of Systems Review of Systems  Reason unable to perform ROS: See HPI as above.     Physical Exam Triage Vital Signs ED Triage Vitals  Enc Vitals Group     BP 02/21/20 0843 121/76  Pulse Rate 02/21/20 0843 73     Resp 02/21/20 0843 18     Temp 02/21/20 0843 98.7 F (37.1 C)     Temp Source 02/21/20 0843 Oral     SpO2 02/21/20 0843 98 %     Weight --      Height --      Head Circumference --      Peak Flow --      Pain Score 02/21/20 0903 0     Pain Loc --      Pain Edu? --      Excl. in GC? --    No data found.  Updated Vital Signs BP 121/76 (BP Location: Left Arm)   Pulse 73   Temp 98.7 F (37.1 C) (Oral)   Resp 18   SpO2 98%   Physical Exam Constitutional:      General: She is not in acute distress.    Appearance: Normal appearance. She is well-developed. She is not ill-appearing, toxic-appearing or diaphoretic.  HENT:     Head: Normocephalic and atraumatic.     Right Ear: Tympanic membrane, ear canal and external ear normal. Tympanic membrane is not erythematous or bulging.     Left Ear: Tympanic membrane, ear canal and external ear normal. Tympanic  membrane is not erythematous or bulging.     Nose:     Right Turbinates: Swollen.     Left Turbinates: Swollen.     Right Sinus: Maxillary sinus tenderness present. No frontal sinus tenderness.     Left Sinus: Maxillary sinus tenderness present. No frontal sinus tenderness.     Mouth/Throat:     Mouth: Mucous membranes are moist.     Pharynx: Oropharynx is clear. Uvula midline.  Eyes:     Conjunctiva/sclera: Conjunctivae normal.     Pupils: Pupils are equal, round, and reactive to light.  Cardiovascular:     Rate and Rhythm: Normal rate and regular rhythm.  Pulmonary:     Effort: Pulmonary effort is normal. No accessory muscle usage, prolonged expiration, respiratory distress or retractions.     Breath sounds: No decreased air movement or transmitted upper airway sounds. No decreased breath sounds.     Comments: LCTAB Musculoskeletal:     Cervical back: Normal range of motion and neck supple.  Skin:    General: Skin is warm and dry.  Neurological:     Mental Status: She is alert and oriented to person, place, and time.      UC Treatments / Results  Labs (all labs ordered are listed, but only abnormal results are displayed) Labs Reviewed  NOVEL CORONAVIRUS, NAA    EKG   Radiology No results found.  Procedures Procedures (including critical care time)  Medications Ordered in UC Medications - No data to display  Initial Impression / Assessment and Plan / UC Course  I have reviewed the triage vital signs and the nursing notes.  Pertinent labs & imaging results that were available during my care of the patient were reviewed by me and considered in my medical decision making (see chart for details).    COVID PCR test ordered. Patient to quarantine until testing results return. No alarming signs on exam. LCTAB. Symptomatic treatment discussed.  Push fluids.  Return precautions given.  Patient expresses understanding and agrees to plan.  Final Clinical Impressions(s) /  UC Diagnoses   Final diagnoses:  Cough  Rhinorrhea  Sore throat  Encounter for screening for COVID-19   ED Prescriptions  Medication Sig Dispense Auth. Provider   benzonatate (TESSALON) 200 MG capsule Take 1 capsule (200 mg total) by mouth every 8 (eight) hours. 21 capsule Dehlia Kilner V, PA-C   ipratropium (ATROVENT) 0.06 % nasal spray Place 2 sprays into both nostrils 4 (four) times daily. 15 mL Tavaris Eudy V, PA-C   fluticasone (FLONASE) 50 MCG/ACT nasal spray Place 2 sprays into both nostrils daily. 1 g Belinda Fisher, PA-C     PDMP not reviewed this encounter.   Belinda Fisher, PA-C 02/21/20 [phone]

## 2020-02-21 NOTE — Discharge Instructions (Addendum)
COVID PCR testing ordered. I would like you to quarantine until testing results. Tessalon for cough. Start flonase, atrovent nasal spray for nasal congestion/drainage. You can use over the counter nasal saline rinse such as neti pot for nasal congestion. Keep hydrated, your urine should be clear to pale yellow in color. Tylenol/motrin for fever and pain. If experiencing shortness of breath, trouble breathing, go to the emergency department for further evaluation needed.   For sore throat/cough try using a honey-based tea. Use 3 teaspoons of honey with juice squeezed from half lemon. Place shaved pieces of ginger into 1/2-1 cup of water and warm over stove top. Then mix the ingredients and repeat every 4 hours as needed.

## 2020-02-21 NOTE — ED Triage Notes (Signed)
Pt c/o headaches, cough, sore throat, and nasal congestion since Saturday.

## 2020-02-22 ENCOUNTER — Ambulatory Visit: Payer: 59 | Admitting: Licensed Clinical Social Worker

## 2020-02-22 NOTE — Chronic Care Management (AMB) (Signed)
    Clinical Social Work  Care Management Outreach   02/22/2020 Name: ORIANNA BISKUP MRN: [id number] DOB: 1976/02/24  [name] is a 44 y.o. year old female who is a primary care patient of Dana Allan, MD .  The Care Management team was consulted for assistance with Mental Health Counseling and Resources and connecting to mental health provider.  LCSW reached out to [name] today by phone for ongoing assessment of needs Care Management services. Patient not feeling well and would like to reschedule appointment. Plan: appointment rescheduled Sept. 15th   Review of patient status, including review of consultants reports, relevant laboratory and other test results, and collaboration with appropriate care team members and the patient's provider was performed as part of comprehensive patient evaluation and provision of care management services.    Sammuel Hines, LCSW Care Management & Coordination  [facility] / [facility]   [phone] 9:21 AM

## 2020-02-23 LAB — SARS-COV-2, NAA 2 DAY TAT

## 2020-02-23 LAB — NOVEL CORONAVIRUS, NAA: SARS-CoV-2, NAA: NOT DETECTED

## 2020-02-28 ENCOUNTER — Ambulatory Visit: Payer: 59 | Admitting: Licensed Clinical Social Worker

## 2020-02-28 DIAGNOSIS — Z7189 Other specified counseling: Secondary | ICD-10-CM

## 2020-02-28 DIAGNOSIS — F329 Major depressive disorder, single episode, unspecified: Secondary | ICD-10-CM

## 2020-02-28 NOTE — Chronic Care Management (AMB) (Signed)
Care Management   Clinical Social Work Follow Up   02/28/2020 Knight: Dana Knight MRN: [id number] DOB: [birth year] Referred by: Dana Allan, MD  Reason for referral : Care Coordination (connect for counseling)  Dana Knight is a 44 y.o. year old female who is a primary care patient of Dana Allan, MD.  Reason for follow-up: assess for barriers and progress with care plan .    Assessment: Patient continues to experience symptoms of depression and anxiety which seems to be exacerbated by life stressors of work and home. Patient has not been able to make progress towards goal. See care plan below for appointments scheduled.   Patient would like continued follow-up from CCM LCSW. Plan: LCSW will F/U with patient in 2 to 3 weeks  Advance Directive Status: ; not addressed during this encounter.  SDOH (Social Determinants of Health) assessments performed: No new needs identified   Goals Addressed            This Visit's Progress    Anxiety and Depression managment   On track    CARE PLAN ENTRY (see longitudinal plan of care for additional care plan information)  Current Barriers:   Patient acknowledges deficits with connecting to mental health provider to manage symptoms of depression and anxiety.   Patient is experiencing symptoms of  anxiety and depression which seem to be exacerbated by life stressors.      Patient needs Support, Education, and Care Coordination in order to meet unmet mental health needs   Difficulty with focus, forgetting things, getting worse over past 5 years, impacting work and home Clinical Social Work Goal(s):   Over the next 30 days, patient will continue with ongoing counseling.  Interventions:   Assessed patient's progress, needs and barriers to care  Provided basic mental health support, education and interventions (- Relieving Stress and Breathing to Relax)  Discussed several options for long term counseling based on need and insurance.  Assisted patient with narrowing the options down to [facility] care counseling )  Assisted patient with making call to schedule appointment. Counseling appointment Sept 27th at 3:00 and medication management appointment Friday Nov. 19th at 4:00)  Reviewed mental health medications with patient prescribed by PCP and discussed compliance ( not taking medication daily)  Other interventions include: Motivational Interviewing ,Psychoeducation and/or Health Education,Solution-Focused Strategies Patient Self Care Activities & Deficits:   Patient is unable to independently navigate community resource options without care coordination support  Patient is able to implement clinical interventions discussed today and will keep appointments  Patient is motivated for treatment Please see past updates related to this goal by clicking on the "Past Updates" button in the selected goal         Outpatient Encounter Medications as of 02/28/2020  Medication Sig Note   benzonatate (TESSALON) 200 MG capsule Take 1 capsule (200 mg total) by mouth every 8 (eight) hours.    fluticasone (FLONASE) 50 MCG/ACT nasal spray Place 2 sprays into both nostrils daily.    ibuprofen (ADVIL) 200 MG tablet Take 1 tablet (200 mg total) by mouth every 6 (six) hours as needed for fever, headache or moderate pain.    ipratropium (ATROVENT) 0.06 % nasal spray Place 2 sprays into both nostrils 4 (four) times daily.    levonorgestrel (MIRENA) 20 MCG/24HR IUD 1 each by Intrauterine route once. 02/05/2018: replaced November 2018   No facility-administered encounter medications on file as of 02/28/2020.   Review of patient status, including review of consultants reports,  relevant laboratory and other test results, and collaboration with appropriate care team members and the patient's provider was performed as part of comprehensive patient evaluation and provision of care management services.    Sammuel Hines, LCSW Care Management &  Coordination  [facility] / [facility]   [phone] 2:13 PM

## 2020-03-21 ENCOUNTER — Ambulatory Visit: Payer: 59 | Admitting: Licensed Clinical Social Worker

## 2020-03-21 DIAGNOSIS — Z7189 Other specified counseling: Secondary | ICD-10-CM

## 2020-03-21 NOTE — Chronic Care Management (AMB) (Signed)
  Care Management   Clinical Social Work Follow Up   03/21/2020 Name: Dana Knight MRN: [id number] DOB: 09-29-1975 Referred by: Dana Knight  Reason for referral : Care Coordination (mental health support)  Dana Knight is a 44 y.o. year old female who is a primary care patient of Dana Knight.  Reason for follow-up: assess for barriers and progress with connecting for ongoing counseling and medication managment .    Assessment: Patient continues to experience difficulty with symptoms of depression.  LCSW assisted with connecting to counseling however agency has rescheduled with patient two times.  She is finding it more difficult to focus at work and has taken two weeks off.  Dana Knight reports she has resume taking Remeron 15mg  , does not take it daily due to it causing her to over sleep at times. She is scheduled to have her first therapy session Monday Oct 11th with [facility].  They will also evaluate her for medication management on Nov. 19th. Interventions:  - activity or exercise based on tolerance encouraged; Behavioral Activation  - medication list reviewed for depression  - attendance at mental health appointments reviewed - barriers to treatment reviewed and addressed - reflective listening  Plan: Patient would like continued follow-up from CCM LCSW. F/U with patient  Advance Directive Status: not addressed during this encounter.  SDOH (Social Determinants of Health) assessments performed:  No needs identified   Goals Addressed            This Visit's Progress   .           04-29-1985 Begin and Stick with Counseling       Follow Up Date 03/29/2020    - keep 90 percent of counseling appointments with [facility] Counseling and your medication management appointment with them Friday Nov. 19th at 4:00) ( I am so sorry they have rescheduled with  several times. -continue with your exercise program - set structure time for self at the end of your day   Why is this  important?   Beating depression may take some time.  If you don't feel better right away, don't give up on your treatment plan.        Outpatient Encounter Medications as of 03/21/2020  Medication Sig Note  . benzonatate (TESSALON) 200 MG capsule Take 1 capsule (200 mg total) by mouth every 8 (eight) hours.   . fluticasone (FLONASE) 50 MCG/ACT nasal spray Place 2 sprays into both nostrils daily.   05/21/2020 ibuprofen (ADVIL) 200 MG tablet Take 1 tablet (200 mg total) by mouth every 6 (six) hours as needed for fever, headache or moderate pain.   Marland Kitchen ipratropium (ATROVENT) 0.06 % nasal spray Place 2 sprays into both nostrils 4 (four) times daily.   Marland Kitchen levonorgestrel (MIRENA) 20 MCG/24HR IUD 1 each by Intrauterine route once. 02/05/2018: replaced November 2018   No facility-administered encounter medications on file as of 03/21/2020.   Review of patient status, including review of consultants reports, relevant laboratory and other test results, and collaboration with appropriate care team members and the patient's provider was performed as part of comprehensive patient evaluation and provision of care management services.    05/21/2020, LCSW Care Management & Coordination  [facility] / [facility]   [phone] 12:33 PM

## 2020-03-21 NOTE — Patient Instructions (Signed)
  Ms. Scripter  it was nice speaking with you. Please call me directly if you have questions about the goals we discussed.  Goals Addressed            This Visit's Progress   .           Marland Kitchen Begin and Stick with Counseling       Follow Up Date 03/29/2020    - keep 90 percent of counseling appointments with [facility] Counseling and your medication management appointment with them Friday Nov. 19th at 4:00) ( I am so sorry they have rescheduled with  several times. -continue with your exercise program - set structure time for self at the end of your day   Why is this important?   Beating depression may take some time.  If you don't feel better right away, don't give up on your treatment plan.       Ms. Langi received Care Management services today:  1. Care Management services include personalized support from designated clinical staff supervised by her physician, including individualized plan of care and coordination with other care providers 2. 24/7 contact [phone] for assistance for urgent and routine care needs. 3. Care Management are voluntary services and be declined at any time by calling the office.  Patient verbalizes understanding of instructions provided today.  Follow up plan: SW will follow up with patient by phone over the next 1 to 2 weeks  Soundra Pilon, LCSW  [phone]

## 2020-03-22 ENCOUNTER — Ambulatory Visit: Payer: 59 | Admitting: Family Medicine

## 2020-03-29 ENCOUNTER — Telehealth: Payer: Self-pay | Admitting: Licensed Clinical Social Worker

## 2020-03-29 NOTE — Chronic Care Management (AMB) (Signed)
    Clinical Social Work  Care Management Outreach   03/29/2020 Name: Dana Knight MRN: [id number] DOB: [birth year]  Dana Knight is a 44 y.o. year old female who is a primary care patient of Dana Allan, MD .   F/U phone call to Dana Knight today to assess needs, and progress with connecting to counselor.  The outreach was unsuccessful. A HIPPA compliant phone message was left for the patient providing contact information and requesting a return call.   Plan: LCSW will wait for return call, if no return is received will meet with patient during office visit with provider next week  Review of patient status, including review of consultants reports, relevant laboratory and other test results, and collaboration with appropriate care team members and the patient's provider was performed as part of comprehensive patient evaluation and provision of care management services.    Sammuel Hines, LCSW Care Management & Coordination  [facility] / [facility]   [phone] 11:10 AM

## 2020-04-02 ENCOUNTER — Other Ambulatory Visit: Payer: Self-pay

## 2020-04-02 ENCOUNTER — Ambulatory Visit (INDEPENDENT_AMBULATORY_CARE_PROVIDER_SITE_OTHER): Payer: 59 | Admitting: Family Medicine

## 2020-04-02 ENCOUNTER — Encounter: Payer: Self-pay | Admitting: Family Medicine

## 2020-04-02 VITALS — BP 100/60 | HR 74 | Ht 65.0 in | Wt 162.1 lb

## 2020-04-02 DIAGNOSIS — F3160 Bipolar disorder, current episode mixed, unspecified: Secondary | ICD-10-CM

## 2020-04-02 DIAGNOSIS — Z23 Encounter for immunization: Secondary | ICD-10-CM | POA: Diagnosis not present

## 2020-04-02 NOTE — Progress Notes (Signed)
    SUBJECTIVE:   CHIEF COMPLAINT / HPI:   Bipolar depression Patient has a history of bipolar disorder, unclear which type. States she has been feeling very overwhelmed and depressed recently and is requesting a medical leave of absence from work. Her depression has been gradually worsening over the past 1 year. Patient's family member (her son's grandmother) passed away unexpectedly this past year and this has placed additional stress on her. States she feels a lot of responsibility for her other family members (particularly her son's father and her children: ages 67, 50, and 5). Feels she hasn't been able to grieve properly because she's been extremely busy taking care of others and working at [employer]. Patient states she feels overwhelmed and unappreciated. Will sometimes go all day without eating and typically doesn't sleep well at night. Denies periods of elevated mood, no hx of mania to her knowledge. Although does endorse spending sprees and states she has a lot of debt from this. Denies SI or thoughts of self harm.   She was prescribed Remeron in the past and finds it very helpful, although she takes it inconsistently. Her job requires shift work and Remeron makes her drowsy so this is why she doesn't take it daily.  She is followed by [facility] for both therapy and medication management. Has upcoming appointment on Nov. 19th for medication management. Has not been able to schedule therapy on regular basis and expresses interest in other counseling resources.   PERTINENT  PMH / PSH: Bipolar disorder  OBJECTIVE:   BP 100/60   Pulse 74   Ht 5\' 5"  (1.651 m)   Wt 162 lb 2 oz (73.5 kg)   SpO2 99%   BMI 26.98 kg/m   Gen: alert, well-appearing, NAD Heart: RRR, normal S1/S2 without m/r/g Lungs: normal WOB, lungs CTAB Neuro: grossly intact Psych: appropriate affect, proper hygiene, normal speech, normal though content, no hallucinations/delusions  ASSESSMENT/PLAN:    Bipolar affective disorder Unchanged from prior. Overall not well controlled. PHQ9 score 15 today, negative to question #9. No self-harm, SI, HI, hallucinations or delusions. Not currently manic. Patient taking Mirtazapine intermittently and receiving occasional therapy via [facility] -f/u with [facility] on Nov 19th for medication management -will discuss alternative therapy options with Nov 21 who is currently working with patient -will complete FMLA paperwork to allow patient to better address her mental health  Discussed case with Dr. Sammuel Hines.  Manson Passey, MD [facility]

## 2020-04-02 NOTE — Assessment & Plan Note (Addendum)
Unchanged from prior. Overall not well controlled. PHQ9 score 15 today, negative to question #9. No self-harm, SI, HI, hallucinations or delusions. Not currently manic. Patient taking Mirtazapine intermittently and receiving occasional therapy via [facility] -f/u with [facility] on Nov 19th for medication management -will discuss alternative therapy options with Sammuel Hines who is currently working with patient -will complete FMLA paperwork to allow patient to better address her mental health

## 2020-04-02 NOTE — Patient Instructions (Signed)
It was great to meet you!  Our plans for today:  - Please have your employer fax FMLA paperwork to our office and I will fill it out for you. The fax number is [phone]. - Be sure to attend your November 19th appointment at the [facility].  Take care and seek immediate care sooner if you develop any concerns.   Dr. Estil D[facility]

## 2020-04-05 ENCOUNTER — Ambulatory Visit: Payer: 59 | Admitting: Licensed Clinical Social Worker

## 2020-04-05 DIAGNOSIS — Z7189 Other specified counseling: Secondary | ICD-10-CM

## 2020-04-05 NOTE — Chronic Care Management (AMB) (Signed)
   Clinical Social Work  Care Management referral   04/05/2020 Name: Dana Knight MRN: [id number] DOB: 08/01/1975  Dana Knight is a 44 y.o. year old female who is a primary care patient of Dana Allan, MD .  F/U call to [name] today to assess needs and barriers with connecting for counseling.    Assessment: Patient is making progress towards goal.  States she has counseling appointment Monday and will speak with her employer next week if more time is needed from work.  Intervention:    Assessment of needs and barriers completed;  . Other interventions include:Motivational Interviewing;Emotional/Supportive Counseling   Plan: Patient agreed to services provided today, however does not require or desire additional follow up.Patient states she will call office or LCSW if needed.  SDOH (Social Determinants of Health) assessments performed; No needs identified   Goals Addressed            This Visit's Progress   . Begin and Stick with Counseling   On track     - keep counseling appointments with [facility]  Oct 25th and your medication management appointment with them Friday Nov. 19th at 4:00) -continue to set structure time for self at the end of your day   Why is this important?   Beating depression may take some time.  If you don't feel better right away, don't give up on your treatment plan.       Outpatient Encounter Medications as of 04/05/2020  Medication Sig Note  . fluticasone (FLONASE) 50 MCG/ACT nasal spray Place 2 sprays into both nostrils daily.   Marland Kitchen ibuprofen (ADVIL) 200 MG tablet Take 1 tablet (200 mg total) by mouth every 6 (six) hours as needed for fever, headache or moderate pain.   Marland Kitchen ipratropium (ATROVENT) 0.06 % nasal spray Place 2 sprays into both nostrils 4 (four) times daily.   Marland Kitchen levonorgestrel (MIRENA) 20 MCG/24HR IUD 1 each by Intrauterine route once. 02/05/2018: replaced November 2018   No facility-administered encounter  medications on file as of 04/05/2020.   Review of patient status, including review of consultants reports, relevant laboratory and other test results, and collaboration with appropriate care team members and the patient's provider was performed as part of comprehensive patient evaluation and provision of care management services.    Sammuel Hines, LCSW Care Management & Coordination  [facility] / [facility]   [phone] 12:45 PM

## 2020-04-05 NOTE — Patient Instructions (Addendum)
°  Dana Knight  it was nice speaking with you. Please call me directly [phone] if you have questions about the goals we discussed. Goals Addressed            This Visit's Progress    Begin and Stick with Counseling   On track     - keep counseling appointments with [facility]  Oct 25th and your medication management appointment with them Friday Nov. 19th at 4:00) -continue to set structure time for self at the end of your day   Why is this important?   Beating depression may take some time.  If you don't feel better right away, don't give up on your treatment plan.      Dana Knight received Care Management services today:  1. Care Management services include personalized support from designated clinical staff supervised by her physician, including individualized plan of care and coordination with other care providers 2. 24/7 contact [phone] for assistance for urgent and routine care needs. 3. Care Management are voluntary services and be declined at any time by calling the office.  Patient verbalizes understanding of instructions provided today.  Follow up plan: Client will call office as needed, no follow-up scheduled   Soundra Pilon, LCSW

## 2020-04-17 ENCOUNTER — Telehealth: Payer: Self-pay | Admitting: Family Medicine

## 2020-04-17 ENCOUNTER — Ambulatory Visit: Payer: 59 | Admitting: Licensed Clinical Social Worker

## 2020-04-17 DIAGNOSIS — F329 Major depressive disorder, single episode, unspecified: Secondary | ICD-10-CM

## 2020-04-17 DIAGNOSIS — Z7189 Other specified counseling: Secondary | ICD-10-CM

## 2020-04-17 NOTE — Telephone Encounter (Signed)
Patient is calling to check on the status of her FMLA paperwork. She said it was supposed to be faxed from [place] last week. I did not see it in Dr. Claris Che box. I informed patient she could be working on it or we might have have received it. Pt is going to ask them to refax it to be sure because it is due 04-23-20.  Please call patient to let her know when it has been completed. Best call back is [phone]

## 2020-04-17 NOTE — Patient Instructions (Signed)
°  Dana Knight  it was nice speaking with you. Please call me directly [phone] if you have questions about the goals we discussed. Goals Addressed            This Visit's Progress    COMPLETED: Anxiety and Depression managment       Start Counseling       Patient Activities:   implement interventions discussed today to decreases symptoms of anxiety and depression and increase knowledge and/or ability of: coping skills, self-management skills, and stress reduction.  implement clinical interventions discussed today relaxed breathing and exercise  Keep appointment with [facility] Nov. 22nd at 10:00; they will schedule your appointment for medication management  which has been pushed back to Jan.   Review the [facility] Education e-mailed on Movement : Emotional Health and Breathing to Relax  Schedule F/U appointment with  Dr. Clent Ridges or Dr. Anner Crete      Dana Knight received Care Management services today:  1. Care Management services include personalized support from designated clinical staff supervised by her physician, including individualized plan of care and coordination with other care providers 2. 24/7 contact [phone] for assistance for urgent and routine care needs. 3. Care Management are voluntary services and be declined at any time by calling the office.  Patient verbalizes understanding of instructions provided today.  Follow up plan: Client will call LCSW as needed  Soundra Pilon, LCSW

## 2020-04-17 NOTE — Chronic Care Management (AMB) (Signed)
Care Management   Clinical Social Work Follow Up   04/17/2020 Name: Dana Knight MRN: [id number] DOB: 10/25/1975 Referred by: Dana Allan, MD  Reason for referral : Care Coordination (mental health needs)  Dana Knight is a 44 y.o. year old female who is a primary care patient of Dana Allan, MD.  Reason for follow-up: assess for barriers and progress with connecting for mental health treatment .    Assessment: Patient continues to experience difficulty with sorting out things and becoming easily overwhelmed. She is taking mirtazpine daily as prescribed and noticed an improvement since being out of work past few weeks. Patient upset with counseling agency for rescheduling her appointments twice and she missed the appointment with all of the confusion. Per [facility] since patient missed appointment she cannot see pscy until she completes assessment and meet with her counselor. Other frustration is insurance not sending FMLA paperwork to [facility] office.  Patient is scheduled to return to work Nov. 9th when PCP wrote her out of work.  She feels like she is ready to try to return.   Recommendation: Patient may benefit from, and is in agreement to scheduled f/u with PCP to get refill on current medication until able to see psych. Reminded patient to bring the bottle to appointment. Plan: Patient states she will call LCSW as needed.  LCSW will f/u as needed.   Advance Directive Status:; not addressed during this encounter.  SDOH (Social Determinants of Health) assessments performed: ; No new needs identified   Patient Care Plan: Connect with mental health provider  Problem Identified: Depression   Goal: Reduce and manage Depressive Symptoms   Start Date: 01/04/2020  This Visit's Progress: On track  Priority: High  Current Barriers: missed appointment with [facility] and not has to wait until Nov.  Will run out of medication before she is able to see psych for medication management. Clinical  Goal(s): Over the next 60 days, patient will work with SW to reduce or manage symptoms of anxiety and depression until connected for ongoing counseling.  Interventions:  . Assessed patient's previous treatment, needs, coping, current treatment, support and barriers to care . Provided basic mental health support, education and interventions  . Collaborated with [facility] regarding patient appointments for counseling and medication management . Discussed options for long term counseling based on need and insurance and barriers with [facility].  . Reviewed mental health medications with patient prescribed by PCP and discussed compliance . Other interventions include: Motivational Interviewing  Patient Activities:  . implement interventions discussed today to decreases symptoms of anxiety and depression and increase knowledge and/or ability of: coping skills, self-management skills, and stress reduction. . implement clinical interventions discussed today  . Keep appointment with [facility] Nov. 22nd at 10:00; they will schedule your appointment for medication management  which has been pushed back to Jan.  . Review the [facility] Education I e-mailed on Movement : Emotional Health and Breathing to Relax . Schedule F/U appointment with  Dr. Clent Ridges or Dr. Anner Crete   Problem Identified: Symptoms (Depression)       Outpatient Encounter Medications as of 04/17/2020  Medication Sig Note  . fluticasone (FLONASE) 50 MCG/ACT nasal spray Place 2 sprays into both nostrils daily.   Marland Kitchen ibuprofen (ADVIL) 200 MG tablet Take 1 tablet (200 mg total) by mouth every 6 (six) hours as needed for fever, headache or moderate pain.   Marland Kitchen ipratropium (ATROVENT) 0.06 % nasal spray Place 2 sprays into both  nostrils 4 (four) times daily.   Marland Kitchen levonorgestrel (MIRENA) 20 MCG/24HR IUD 1 each by Intrauterine route once. 02/05/2018: replaced November 2018   No facility-administered encounter medications on file as of 04/17/2020.    Review of patient status, including review of consultants reports, relevant laboratory and other test results, and collaboration with appropriate care team members and the patient's provider was performed as part of comprehensive patient evaluation and provision of care management services.   Sammuel Hines, LCSW Care Management & Coordination  [facility] / [facility]   [phone] 2:31 PM

## 2020-04-17 NOTE — Telephone Encounter (Signed)
I have not seen this patient in quite some time.  Her last visit was with Dr Anner Crete.  Her condition is not managed here at the clinic and she should be assessed by Mental health provider.  If the FMLA is not for mental health concerns she will need to be seen in clinic for evaluation.  Thanks Dana Allan, MD Family Medicine Residency

## 2020-04-23 ENCOUNTER — Ambulatory Visit: Payer: 59 | Admitting: Licensed Clinical Social Worker

## 2020-04-23 DIAGNOSIS — Z7189 Other specified counseling: Secondary | ICD-10-CM

## 2020-04-23 NOTE — Patient Instructions (Signed)
°  Ms. Hampe  it was nice speaking with you. Please call me directly [phone] if you have questions about the goals we discussed. Goals Addressed            This Visit's Progress    Start Counseling   On track    Patient Self Care Activities:   implement interventions to decreases symptoms of anxiety and depression and increase knowledge and/or ability of: coping skills, self-management skills, and stress reduction.  Keep appointment with [facility] Nov. 22nd at 10:00; they will schedule your appointment for medication management  which has been pushed back to Jan.   Review the [facility] Education I e-mailed on Movement : Emotional Health and Breathing to Relax  Keep appointment with  Dr. Clent Ridges in Nov.       Ms. Andrey Campanile received Care Management services today:  1. Care Management services include personalized support from designated clinical staff supervised by her physician, including individualized plan of care and coordination with other care providers 2. 24/7 contact [phone] for assistance for urgent and routine care needs. 3. Care Management are voluntary services and be declined at any time by calling the office.  Patient verbalizes understanding of instructions provided today.  Follow up plan: SW will follow up with patient by phone over the next 1 to 2 weeks  Soundra Pilon, LCSW

## 2020-04-23 NOTE — Telephone Encounter (Signed)
Clinical info completed on FMLA form.  Place form in Dr. Well's box for completion.  Timathy Newberry Zimmerman Rumple, CMA  

## 2020-04-23 NOTE — Telephone Encounter (Signed)
Contacted pt to give her the below information and she became very upset because she thought she was doing everything right in order to get the forms completed.  She is and has only been treated here at our office per pt and it looks like in a note from 11/3 with Sammuel Hines that she is going to set up something with psych but was to follow up with PCP for refills of medication until then.  She has an appointment scheduled on 11/29 but forms need to be completed before that visit. Routing to PCP to be advised.Edom Schmuhl Zimmerman Rumple, CMA

## 2020-04-23 NOTE — Telephone Encounter (Signed)
I have this paper work and I will be placing in Dr. Anner Crete box for completion per last office visit note.Can Dana Knight, CMA

## 2020-04-23 NOTE — Chronic Care Management (AMB) (Signed)
Care Management   Clinical Social Work Follow Up   04/23/2020 Name: AKEIBA AXELSON MRN: [id number] DOB: 09-18-1975 Referred by: Dana Allan, MD  Reason for referral : Care Coordination  BEN SANZ is a 44 y.o. year old female who is a primary care patient of Dana Allan, MD.  Reason for follow-up: Received call from patient upset about not being able to get FMLA paperwork completed.  Patient last saw Dr. Anner Crete who was in agreement that pateitn needed additional time to manage her mental health needs.     Assessment: Patient returned to work today but continues to experience difficulty with managing.  She continues to take medication but will run out before she is able to see psychiatry with [facility]. Patient needs paperwork so that she can get paid for the time she was previously out of work.  Employer was suppose to send papers to office, she realized when she returned to work today the papers were never received.  Patient is making progress towards goal of managing symptoms however not have the papers was a trigger for her today. .  Advance Directive Status:  not addressed during this encounter.  SDOH (Social Determinants of Health) assessments performed:  No new needs identified   Patient Care Plan: Social Work  Problem Identified: symptoms of Depression   Goal: Reduce and manage Depressive Symptoms   Start Date: 01/04/2020  Recent Progress: On track  Priority: High  Note:   Current Barriers: missed appointment with [facility] and not has to wait until Nov.  Will run out of medication before she is able to see psych for medication management. Clinical Goal(s): Over the next 60 days, patient will work with SW to reduce or manage symptoms of anxiety and depression until connected for ongoing counseling.  Interventions:  . Assessed patient's needs, coping and barriers  . Provided basic mental health support, education and interventions  . Collaborated with CMA to address needs  and concerns with FMLA paper work for patient . Other interventions include: Motivational Interviewing  Patient Self Care Activities:  . implement interventions to decreases symptoms of anxiety and depression and increase knowledge and/or ability of: coping skills, self-management skills, and stress reduction. Marland Kitchen Keep appointment with [facility] Nov. 22nd at 10:00; they will schedule your appointment for medication management  which has been pushed back to Jan.  . Review the [facility] Education I e-mailed on Movement : Emotional Health and Breathing to Relax . Keep appointment with  Dr. Clent Ridges in Nov. Plan:  LCSW will f/u with patient in one week.  Patient will call office if needed      Outpatient Encounter Medications as of 04/23/2020  Medication Sig Note  . fluticasone (FLONASE) 50 MCG/ACT nasal spray Place 2 sprays into both nostrils daily.   Marland Kitchen ibuprofen (ADVIL) 200 MG tablet Take 1 tablet (200 mg total) by mouth every 6 (six) hours as needed for fever, headache or moderate pain.   Marland Kitchen ipratropium (ATROVENT) 0.06 % nasal spray Place 2 sprays into both nostrils 4 (four) times daily.   Marland Kitchen levonorgestrel (MIRENA) 20 MCG/24HR IUD 1 each by Intrauterine route once. 02/05/2018: replaced November 2018   No facility-administered encounter medications on file as of 04/23/2020.   Review of patient status, including review of consultants reports, relevant laboratory and other test results, and collaboration with appropriate care team members and the patient's provider was performed as part of comprehensive patient evaluation and provision of care management services.   Gavin Pound  Bonnielee Haff Care Management & Coordination  [facility] / [facility]   [phone] 1:05 PM

## 2020-04-24 NOTE — Telephone Encounter (Signed)
LVM to call office back so we can get a list of her medications updated at the request of Sammuel Hines.  Will also send a [employer] as well.Dana Knight, CMA

## 2020-04-29 ENCOUNTER — Other Ambulatory Visit: Payer: Self-pay | Admitting: Family Medicine

## 2020-04-29 ENCOUNTER — Telehealth: Payer: Self-pay

## 2020-04-29 DIAGNOSIS — F329 Major depressive disorder, single episode, unspecified: Secondary | ICD-10-CM

## 2020-04-29 MED ORDER — MIRTAZAPINE 15 MG PO TABS: 15.0000 mg | ORAL_TABLET | Freq: Every day | ORAL | 0 refills | Status: DC

## 2020-04-29 NOTE — Telephone Encounter (Signed)
Received completed FMLA paperwork in nurse box. Faxed to provided number on 04/25/20. Contacted patient and informed that form was ready for pick up. Copy made and placed in batch scanning.   Veronda Prude, RN

## 2020-05-08 ENCOUNTER — Ambulatory Visit: Payer: 59 | Admitting: Licensed Clinical Social Worker

## 2020-05-08 DIAGNOSIS — Z7189 Other specified counseling: Secondary | ICD-10-CM

## 2020-05-08 NOTE — Chronic Care Management (AMB) (Signed)
Care Management   Follow Up Clinical Social Work General Note  05/08/2020 Name: Dana Knight MRN: [id number] DOB: May 09, 1976  Dana Knight is enrolled in a Managed Medicaid plan: No. Outreach attempt today was successful.   Dana Knight is a 44 y.o. year old female who is a primary care patient of Dana Allan, MD. The Care Management team was consulted to assist the patient with Mental Health Counseling and Resources.   Assessment: Patient is making progress.  She completed assessment at [facility] and has f/u appointments for counseling and medication management in Jan.  Recent life changes: returned to work Concerns during this encounter: none Plan: No f/u scheduled. Patient will contact LCSW as needed.  If no needs are identified in 60 days, LCSW will disconnect from care team.  SDOH (Social Determinants of Health) screening performed today: None.  Advanced Directives Status: N See Care Plan and Vynca application for related entries.;Not addressed in this encounter.   Patient Care Plan: Social Work  Problem Identified: symptoms of Depression   Goal: Reduce and manage Depressive Symptoms   Start Date: 01/04/2020  This Visit's Progress: On track  Recent Progress: On track  Priority: High  Note:   Current Barriers: missed appointment with [facility] in Nov.  Will run out of medication before she is able to see psych for medication management. Clinical Goal(s): Over the next 60 days, patient will work with SW to reduce or manage symptoms of anxiety and depression until connected for ongoing counseling.  Interventions:  . Assessed patient's needs, coping and barriers  . Other interventions include: Motivational Interviewing  Patient Self Care Activities:  . implement interventions discussed to decreases symptoms of anxiety and depression and increase knowledge and/or ability of: coping skills, self-management skills, and stress reduction. Marland Kitchen Keep appointment with [facility] in Dec. And Jan   . Review the [facility] Education I e-mailed on Movement : Emotional Health and Breathing to Relax . Keep appointment with  Dr. Clent Ridges in Nov  Plan: No f/u scheduled. Patient will contact LCSW as needed    Outpatient Encounter Medications as of 05/08/2020  Medication Sig Note  . fluticasone (FLONASE) 50 MCG/ACT nasal spray Place 2 sprays into both nostrils daily.   Marland Kitchen ibuprofen (ADVIL) 200 MG tablet Take 1 tablet (200 mg total) by mouth every 6 (six) hours as needed for fever, headache or moderate pain.   Marland Kitchen ipratropium (ATROVENT) 0.06 % nasal spray Place 2 sprays into both nostrils 4 (four) times daily.   Marland Kitchen levonorgestrel (MIRENA) 20 MCG/24HR IUD 1 each by Intrauterine route once. 02/05/2018: replaced November 2018  . mirtazapine (REMERON) 15 MG tablet Take 1 tablet (15 mg total) by mouth at bedtime.    No facility-administered encounter medications on file as of 05/08/2020.   [name] was given information about Care Management services today including:  1. Care Management services include personalized support from designated clinical staff supervised by her physician, including individualized plan of care and coordination with other care providers 2. 24/7 contact phone numbers for assistance for urgent and routine care needs. 3. The patient may stop care management services at any time (effective at the end of the month) by phone call to the office staff.  Patient agreed to services and verbal consent obtained.   Soundra Pilon, LCSW

## 2020-05-08 NOTE — Patient Instructions (Signed)
  Ms. Kovacich  it was nice speaking with you. Please call me directly [phone] if you have questions about the goals we discussed. Goals Addressed            This Visit's Progress   . Start Counseling   On track    Patient Self Care Activities:  . implement interventions discussed to decreases symptoms of anxiety and depression and increase knowledge and/or ability of: coping skills, self-management skills, and stress reduction. Marland Kitchen Keep appointment with [facility] in Dec. And Jan   . Review the [facility] Education I e-mailed on Movement : Emotional Health and Breathing to Relax . Keep appointment with  Dr. Clent Ridges in Nov       Ms. Beale received Care Management services today:  1. Care Management services include personalized support from designated clinical staff supervised by her physician, including individualized plan of care and coordination with other care providers 2. 24/7 contact [phone] for assistance for urgent and routine care needs. 3. Care Management are voluntary services and be declined at any time by calling the office. Patient verbalizes understanding of instructions provided today.    Follow up plan: Client will call LCSW as needed no f/u scheduled  Soundra Pilon, LCSW

## 2020-05-13 ENCOUNTER — Ambulatory Visit: Payer: 59 | Admitting: Family Medicine

## 2020-05-20 ENCOUNTER — Ambulatory Visit (INDEPENDENT_AMBULATORY_CARE_PROVIDER_SITE_OTHER): Payer: 59 | Admitting: Family Medicine

## 2020-05-20 ENCOUNTER — Other Ambulatory Visit (HOSPITAL_COMMUNITY)
Admission: RE | Admit: 2020-05-20 | Discharge: 2020-05-20 | Disposition: A | Discharge status: Home / Self Care | Payer: 59 | Source: Ambulatory Visit | Attending: Family Medicine | Admitting: Family Medicine

## 2020-05-20 ENCOUNTER — Encounter: Payer: Self-pay | Admitting: Family Medicine

## 2020-05-20 ENCOUNTER — Other Ambulatory Visit: Payer: Self-pay

## 2020-05-20 VITALS — BP 110/80 | Ht 65.0 in | Wt 165.4 lb

## 2020-05-20 DIAGNOSIS — N898 Other specified noninflammatory disorders of vagina: Secondary | ICD-10-CM | POA: Insufficient documentation

## 2020-05-20 DIAGNOSIS — F3160 Bipolar disorder, current episode mixed, unspecified: Secondary | ICD-10-CM | POA: Diagnosis not present

## 2020-05-20 DIAGNOSIS — B9689 Other specified bacterial agents as the cause of diseases classified elsewhere: Secondary | ICD-10-CM

## 2020-05-20 DIAGNOSIS — N760 Acute vaginitis: Secondary | ICD-10-CM | POA: Diagnosis not present

## 2020-05-20 LAB — POCT WET PREP (WET MOUNT)
Clue Cells Wet Prep Whiff POC: POSITIVE
Trichomonas Wet Prep HPF POC: ABSENT

## 2020-05-20 MED ORDER — ESCITALOPRAM OXALATE 5 MG PO TABS: 10.0000 mg | ORAL_TABLET | Freq: Every day | ORAL | 1 refills | Status: DC

## 2020-05-20 MED ORDER — HYDROXYZINE HCL 10 MG PO TABS: 5.0000 mg | ORAL_TABLET | Freq: Three times a day (TID) | ORAL | 0 refills | Status: DC | PRN

## 2020-05-20 NOTE — Progress Notes (Signed)
    SUBJECTIVE:   CHIEF COMPLAINT / HPI: medication refill and vaginal odor  Vaginal odor Patient reports foul vaginal odor for few days.  No vaginal itching or irritation.  She is currently sexually active and has IUD inserted.  Denies any abdominal pain or urinary symptoms.  No abnormal vaginal bleeding.  Depression/anxiety Patient is requesting refill for mirtazapine.  She uses medication at night only on a as needed basis.  She has not been able to see psychiatry.  She reports she has an appointment with Rolly Salter at the right center this Thursday.  She denies any suicidal ideation or homicidal ideation.  She is currently in the process of moving out of her home as she feels unsafe there secondary to the area she is living in.  PERTINENT  PMH / PSH: Bipolar  OBJECTIVE:   BP 110/80   Ht 5\' 5"  (1.651 m)   Wt 165 lb 6 oz (75 kg)   BMI 27.52 kg/m    General: Alert and oriented, no apparent distress  EVE: No lesions or abrasions noted SVE: Cervix normal, IUD strings visible, small amount yellowish discharge noted in vaginal vault, normal vaginal mucosa Bimanual: No CMT or adnexal masses noted. Psych: Behavior and speech appropriate to situation, no SI/HI  ASSESSMENT/PLAN:   Bipolar affective disorder, current episode mixed (HCC) PHQ score 14, GAD 9.  Symptoms not well controlled with mirtazapine.  Patient has appointment with right center this Thursday.  Has not been able to establish relationship with psychiatrist.  In collaboration with patient we decided to start Lexapro 10 mg daily and Atarax 10 mg at night. -She will follow-up with Tuesday her therapist on Thursday. -Given her social situation at home and all of her current stressors I think will be reasonable for her to have some time off work until she is able to follow-up with a psychiatrist and have better management of her symptoms. -CCM is following, appreciate any recommendations. -Follow-up appointment made for December 14  with me next week, at that time she will bring in her FMLA forms.  Bacterial vaginosis Current complaint of vaginal odor. Wet mount positive for BV Flagyl 500 mg twice daily x7/7 Follow-up as needed.     December 16, MD [facility]

## 2020-05-20 NOTE — Patient Instructions (Addendum)
Outpatient Mental Health Providers (No Insurance required or Self Pay)  [facility]  [address] [place], [place] [phone] Crisis [phone]  [facility]) can see uninsured folks for outpatient therapy [email or web address] [address] [place], [place] [address] [phone]  [facility]    Walk-in Mon-Fri, 8am-3pm [email or web address] [address], [place], [place]  [phone]   [address]   [phone] [facility] for psych med management, there may be a wait- if [facility] is working with clients for OPT, they will coordinate with [facility] for psych  [facility]   Walk-in-Clinic: Monday- Friday 9:00 AM - 4:00 PM [address]   [place], [place] [phone]  [facility] the [place] ([employer]) walk in M-F 8am-12pm and  1pm-3pm [place]- [address]     [phone]  [employer] -[address]  Phone: [phone]  [employer] (Mental Health and substance challenges) [address]   [place] [phone]    [email or web address]    [facility]  [place] -[address] [place], [place]     Phone:  [phone] [place]-  [address]  [phone]   [facility]  [address] [place]  [phone] PrepaidHoliday.ch   [facility] ( virtual or zoom therapy) [email or web address]  [address]  [phone]    [facility] [phone]  grief counseling, dementia and caregiver support    [facility] Walk-in MWF 12:30 to 3:00     [address] [place] [address]  [phone]  www.[email or web address] call to schedule an appointment    Mental Health [facility] Classes ,Support group, Peer support services, [address], [place], [place] [address] [phone]   PhotoSolver.pl           National Alliance on Mental Illness (NAMI) [place]- Wellness classes, Support groups        [address], [place], [place] [address] [phone]   [email or web address]   [facility]  (Psycho-social Rehabilitation clubhouse, Individual and group therapy) [address] [place], [place] [address]   [phone]  24- Hour Availability:  [facility] [phone] or [phone] * [facility] the [employer] (Domestic Violence, Rape, etc. )[phone] Vesta Mixer [phone] or [phone] * [facility] [place] [facility] [phone] (after hours) *Therapeutic Alternative Mobile Crisis Unit [phone] *[place] National Suicide Hotline [phone] Len Childs)   Follow up with Rolly Salter on Thursday.   I will see you on the 14th of dec at 4pm  I will call you with the results of your test.  Have a great week  Dana Allan, MD [facility] Medicine Residency

## 2020-05-21 ENCOUNTER — Encounter: Payer: Self-pay | Admitting: Family Medicine

## 2020-05-21 LAB — CERVICOVAGINAL ANCILLARY ONLY
Chlamydia: NEGATIVE
Comment: NEGATIVE
Comment: NORMAL
Neisseria Gonorrhea: NEGATIVE

## 2020-05-21 MED ORDER — METRONIDAZOLE 500 MG PO TABS: 500.0000 mg | ORAL_TABLET | Freq: Two times a day (BID) | ORAL | 0 refills | Status: AC

## 2020-05-25 ENCOUNTER — Encounter: Payer: Self-pay | Admitting: Family Medicine

## 2020-05-25 ENCOUNTER — Telehealth: Payer: Self-pay | Admitting: Family Medicine

## 2020-05-25 NOTE — Telephone Encounter (Signed)
Called patient to follow-up on recent medication addition of Lexapro and Atarax.  She reports she sees some improvement.  No SI or HI.  She will follow-up with me on Tuesday, December 14 and bring in FMLA forms at that time.  I had sent my chart message to notify her of results wet prep and to ensure she has started her medications.  She reports she had not received message.  She is now aware that antibiotics are at the pharmacy for her to pick up to start for her recent positive results of BV.  Dana Allan, MD Family Medicine Residency

## 2020-05-25 NOTE — Assessment & Plan Note (Signed)
Current complaint of vaginal odor. Wet mount positive for BV Flagyl 500 mg twice daily x7/7 Follow-up as needed.

## 2020-05-25 NOTE — Assessment & Plan Note (Signed)
PHQ score 14, GAD 9.  Symptoms not well controlled with mirtazapine.  Patient has appointment with right center this Thursday.  Has not been able to establish relationship with psychiatrist.  In collaboration with patient we decided to start Lexapro 10 mg daily and Atarax 10 mg at night. -She will follow-up with Rolly Salter her therapist on Thursday. -Given her social situation at home and all of her current stressors I think will be reasonable for her to have some time off work until she is able to follow-up with a psychiatrist and have better management of her symptoms. -CCM is following, appreciate any recommendations. -Follow-up appointment made for December 14 with me next week, at that time she will bring in her FMLA forms.

## 2020-05-28 ENCOUNTER — Encounter: Payer: Self-pay | Admitting: Family Medicine

## 2020-05-28 ENCOUNTER — Ambulatory Visit (INDEPENDENT_AMBULATORY_CARE_PROVIDER_SITE_OTHER): Payer: 59 | Admitting: Family Medicine

## 2020-05-28 ENCOUNTER — Other Ambulatory Visit: Payer: Self-pay

## 2020-05-28 DIAGNOSIS — F3160 Bipolar disorder, current episode mixed, unspecified: Secondary | ICD-10-CM | POA: Diagnosis not present

## 2020-05-28 NOTE — Progress Notes (Signed)
    SUBJECTIVE:   CHIEF COMPLAINT / HPI:  F/u depression  Started Lexapro and Atartax at last visit. Tolerating medications. Can see some small improvement. Denies any SI/HI.  Plans to continue on medication to see if it will help.  Feels that therapy is helping.  PERTINENT  PMH / PSH:  Bipolar  OBJECTIVE:   BP 112/72   Pulse 77   Wt 163 lb 6.4 oz (74.1 kg)   SpO2 100%   BMI 27.19 kg/m    General: Alert and oriented, no apparent distress  Cardiovascular: RRR with no murmurs noted Respiratory: CTA bilaterally  Psych: Behavior and speech appropriate to situation  ASSESSMENT/PLAN:   Bipolar affective disorder, current episode mixed (HCC) PHQ 9 and GAD reviewed and improved.  Tolerating medications well.  Therapy going well. -Continue Lexapro and Atarax -Continue Therapy -FMLA forms will be completed and faxed on Thurs. Will let patient know when completed. -Mental health and Crisis line provided -Follow up appointment scheduled for Jan 18/22 with me     Dana Allan, MD [facility] Medicine Center

## 2020-05-28 NOTE — Patient Instructions (Signed)
Follow up with me Jan 18 at 920.  Continue therapy with Hailey as scheduled.  MyChart me if you need anything.  Dana Allan, MD Family Medicine Residency   Outpatient Mental Health Providers (No Insurance required or Self Pay)  [facility]  [address] [place], [place] [phone] Crisis [phone]  [facility]) can see uninsured folks for outpatient therapy [email or web address] [address] [place], [place] [address] [phone]  [facility]    Walk-in Mon-Fri, 8am-3pm [email or web address] Scull [address], [place], [place]  [phone]   [address]  [place] [phone] [facility] [place] [facility] for psych med management, there may be a wait- if [facility] is working with clients for OPT, they will coordinate with [facility] for psych  [facility]   Walk-in-Clinic: Monday- Friday 9:00 AM - 4:00 PM [address]   [place], [place] [phone]  [facility] [place] ([employer]) walk in M-F 8am-12pm and  1pm-3pm [place]- [address]     [phone]  [employer] -[address]  Phone: [phone]  [employer] (Mental Health and substance challenges) [address]   [place] [phone]    [email or web address]    [facility]  [facility] [place], [place]     Phone:  [phone] [place]-  [address] [place]  [phone]   [facility]  [address] [place]  [phone] PrepaidHoliday.ch   [facility] Commun[facility] ( virtual or zoom therapy) [email or web address]  [address] [place]  [phone]    [facility] [phone]  grief counseling, dementia and caregiver support    Alcohol [facility] Drug Services Walk-in MWF 12:30 to 3:00     [address] [place] [address]  [phone]  www.ADSyes.org call to  schedule an appointment    Mental Health [facility] Classes ,Support group, Peer support services, [address] [place], [place] [address] [phone]  PhotoSolver.pl           National Alliance on Mental Illness (NAMI) [place]- Wellness classes, Support groups        [address], [place], [place] [address] [phone]   [email or web address]   [facility]  (Psycho-social Rehabilitation clubhouse, Individual and group therapy) [address] [place], [place] [address]   [phone]  24- Hour Availability:  [facility] [phone] or [phone] * [facility] [employer] (Domestic Violence, Rape, etc. )[phone] Vesta Mixer [phone] or [phone] * [facility] [place] [facility] [phone] only) [phone] (after hours) *Therapeutic Alternative Mobile Crisis Unit [phone] *[place] National Suicide Hotline [phone] Len Childs)

## 2020-05-30 ENCOUNTER — Encounter: Payer: Self-pay | Admitting: Family Medicine

## 2020-05-31 ENCOUNTER — Encounter: Payer: Self-pay | Admitting: Family Medicine

## 2020-05-31 NOTE — Assessment & Plan Note (Signed)
PHQ 9 and GAD reviewed and improved.  Tolerating medications well.  Therapy going well. -Continue Lexapro and Atarax -Continue Therapy -FMLA forms will be completed and faxed on Thurs. Will let patient know when completed. -Mental health and Crisis line provided -Follow up appointment scheduled for Jan 18/22 with me

## 2020-06-12 ENCOUNTER — Other Ambulatory Visit: Payer: Self-pay | Admitting: Family Medicine

## 2020-06-29 NOTE — Progress Notes (Unsigned)
  Attempted video conference x 2. No answer. Called patient and no answer. LVM to reschedule appointment. Also sent MyChart message.  Dana Allan, MD Family Medicine Residency

## 2020-07-02 ENCOUNTER — Telehealth (INDEPENDENT_AMBULATORY_CARE_PROVIDER_SITE_OTHER): Payer: 59 | Admitting: Family Medicine

## 2020-07-02 ENCOUNTER — Encounter: Payer: Self-pay | Admitting: Family Medicine

## 2020-07-02 DIAGNOSIS — Z91199 Patient's noncompliance with other medical treatment and regimen due to unspecified reason: Secondary | ICD-10-CM

## 2020-07-02 DIAGNOSIS — Z5329 Procedure and treatment not carried out because of patient's decision for other reasons: Secondary | ICD-10-CM

## 2020-07-03 ENCOUNTER — Telehealth: Payer: Self-pay | Admitting: Licensed Clinical Social Worker

## 2020-07-03 NOTE — Chronic Care Management (AMB) (Signed)
    Clinical Social Work  Care Management  Unsuccessful Phone Outreach    07/03/2020 Name: KEEANNA VILLAFRANCA MRN: [id number] DOB: March 28, 1976  HAVA MASSINGALE is a 45 y.o. year old female who is a primary care patient of Dana Allan, MD . An unsuccessful telephone outreach was attempted today. The patient was referred to the case management team for assistance with care management and care coordination.   F/U phone call today to assess needs, and progress with care plan goals.   A HIPPA compliant phone message was left for the patient providing contact information and requesting a return call.   Plan: LCSW will wait for return call. If no return will f/u in 1 to 2 weeks.  Review of patient status, including review of consultants reports, relevant laboratory and other test results, and collaboration with appropriate care team members and the patient's provider was performed as part of comprehensive patient evaluation and provision of care management services.    Sammuel Hines, LCSW Care Management & Coordination  [facility] Family Medicine / [facility]   [phone] 2:03 PM

## 2020-09-12 ENCOUNTER — Ambulatory Visit: Payer: Self-pay | Admitting: Licensed Clinical Social Worker

## 2020-09-12 NOTE — Chronic Care Management (AMB) (Signed)
    Clinical Social Work  Care Management  Unsuccessful Phone Outreach    09/12/2020 Name: Dana Knight MRN: [id number] DOB: 1975/07/09  [name] is a 45 y.o. year old female who is a primary care patient of Dana Allan, MD .   F/U phone call today to assess needs, and progress with care plan goals.  Telephone outreach was unsuccessful, a HIPPA compliant phone message was left for the patient providing contact information and requesting a return call.    The patient was referred to the case management team for assistance with care management and care coordination. The patient's primary care provider has been notified of our unsuccessful attempts to make or maintain contact with the patient.  The care management team is pleased to engage with this patient at any time in the future should she be interested in assistance from the care management team.    Plan: LCSW will discontinue outreach calls.  If no call is received in 30 days, will disconnect from care team.  Review of patient status, including review of consultants reports, relevant laboratory and other test results, and collaboration with appropriate care team members and the patient's provider was performed as part of comprehensive patient evaluation and provision of care management services.    Sammuel Hines, LCSW Care Management & Coordination  [facility] Family [facility] / [facility]   [phone] 2:51 PM

## 2020-09-21 ENCOUNTER — Emergency Department (HOSPITAL_BASED_OUTPATIENT_CLINIC_OR_DEPARTMENT_OTHER)
Admission: EM | Admit: 2020-09-21 | Discharge: 2020-09-21 | Disposition: A | Discharge status: Home / Self Care | Payer: 59 | Attending: Emergency Medicine | Admitting: Emergency Medicine

## 2020-09-21 ENCOUNTER — Encounter (HOSPITAL_BASED_OUTPATIENT_CLINIC_OR_DEPARTMENT_OTHER): Payer: Self-pay | Admitting: Emergency Medicine

## 2020-09-21 ENCOUNTER — Other Ambulatory Visit: Payer: Self-pay

## 2020-09-21 DIAGNOSIS — F419 Anxiety disorder, unspecified: Secondary | ICD-10-CM | POA: Diagnosis not present

## 2020-09-21 DIAGNOSIS — M25511 Pain in right shoulder: Secondary | ICD-10-CM | POA: Diagnosis not present

## 2020-09-21 DIAGNOSIS — M62838 Other muscle spasm: Secondary | ICD-10-CM

## 2020-09-21 DIAGNOSIS — R079 Chest pain, unspecified: Secondary | ICD-10-CM | POA: Diagnosis not present

## 2020-09-21 LAB — BASIC METABOLIC PANEL
Anion gap: 9 (ref 5–15)
BUN: 8 mg/dL (ref 6–20)
CO2: 26 mmol/L (ref 22–32)
Calcium: 9.3 mg/dL (ref 8.9–10.3)
Chloride: 101 mmol/L (ref 98–111)
Creatinine, Ser: 0.72 mg/dL (ref 0.44–1.00)
GFR, Estimated: >60 mL/min (ref >60)
Glucose, Bld: 91 mg/dL (ref 70–99)
Potassium: 3.7 mmol/L (ref 3.5–5.1)
Sodium: 136 mmol/L (ref 135–145)

## 2020-09-21 LAB — CBC WITH DIFFERENTIAL/PLATELET
Abs Immature Granulocytes: 0 10*3/uL (ref 0.00–0.07)
Basophils Absolute: 0 10*3/uL (ref 0.0–0.1)
Basophils Relative: 1 %
Eosinophils Absolute: 0.1 10*3/uL (ref 0.0–0.5)
Eosinophils Relative: 1 %
HCT: 43.5 % (ref 36.0–46.0)
Hemoglobin: 14.8 g/dL (ref 12.0–15.0)
Immature Granulocytes: 0 %
Lymphocytes Relative: 47 %
Lymphs Abs: 2.4 10*3/uL (ref 0.7–4.0)
MCH: 31.4 pg (ref 26.0–34.0)
MCHC: 34 g/dL (ref 30.0–36.0)
MCV: 92.2 fL (ref 80.0–100.0)
Monocytes Absolute: 0.5 10*3/uL (ref 0.1–1.0)
Monocytes Relative: 9 %
Neutro Abs: 2 10*3/uL (ref 1.7–7.7)
Neutrophils Relative %: 42 %
Platelets: 310 10*3/uL (ref 150–400)
RBC: 4.72 MIL/uL (ref 3.87–5.11)
RDW: 11.7 % (ref 11.5–15.5)
WBC: 4.9 10*3/uL (ref 4.0–10.5)
nRBC: 0 % (ref 0.0–0.2)

## 2020-09-21 LAB — TROPONIN I (HIGH SENSITIVITY): Troponin I (High Sensitivity): <2 ng/L (ref <18)

## 2020-09-21 MED ORDER — LIDOCAINE 5 % EX PTCH: 1.0000 | MEDICATED_PATCH | CUTANEOUS | 0 refills | Status: DC

## 2020-09-21 MED ORDER — IBUPROFEN 800 MG PO TABS: 800.0000 mg | ORAL_TABLET | Freq: Three times a day (TID) | ORAL | 0 refills | Status: DC

## 2020-09-21 MED ORDER — LIDOCAINE 5 % EX PTCH
2.0000 | MEDICATED_PATCH | CUTANEOUS | Status: DC
  Administered 2020-09-21: 2 via TRANSDERMAL
  Filled 2020-09-21: qty 2

## 2020-09-21 MED ORDER — ACETAMINOPHEN 500 MG PO TABS
1000.0000 mg | ORAL_TABLET | Freq: Once | ORAL | Status: AC
  Administered 2020-09-21: 1000 mg via ORAL
  Filled 2020-09-21: qty 2

## 2020-09-21 MED ORDER — IBUPROFEN 800 MG PO TABS
800.0000 mg | ORAL_TABLET | Freq: Once | ORAL | Status: AC
  Administered 2020-09-21: 800 mg via ORAL
  Filled 2020-09-21: qty 1

## 2020-09-21 NOTE — ED Triage Notes (Signed)
Pt c/o lower back pain that started yesterday. Pt states that today the pain is now in her chest and that it feels like someone is sitting on her chest. c/o HA at this time also. Pt was at work when this pain started. Pt denies any SOB at this time. Pt aaox3, ambulatory with steady gait, NAD noted, no respiratory distress noted. Provider at bedside.

## 2020-09-21 NOTE — ED Provider Notes (Signed)
[facility] EMERGENCY DEPARTMENT Provider Note   CSN: [id number] Arrival date & time: 09/21/20  0232     History Chief Complaint  Patient presents with  . Chest Pain    Dana Knight is a 45 y.o. female.  The history is provided by the patient.  Shoulder Pain Location:  Shoulder Shoulder location:  R shoulder Injury: no   Pain details:    Quality:  Cramping   Radiates to:  R arm and chest   Severity:  Moderate   Onset quality:  Gradual   Duration:  1 day   Timing:  Constant   Progression:  Worsening Dislocation: no   Foreign body present:  No foreign bodies Prior injury to area:  No Relieved by:  Nothing Worsened by:  Nothing Ineffective treatments:  None tried Associated symptoms: stiffness   Associated symptoms: no back pain, no decreased range of motion, no fatigue, no fever, no muscle weakness, no neck pain, no numbness, no swelling and no tingling   Risk factors: no concern for non-accidental trauma   Patient with anxiety presents with right scapular pain that is now in the R shoulder and into the chest.  Worse with movement and palpation.  No f/c/r.  No weakness, no numbness.  No DOE, no exertional symptoms.  No n/v/d.      Past Medical History:  Diagnosis Date  . Abnormal Pap smear   . Anxiety   . History of chlamydia   . HSV (herpes simplex virus) anogenital infection   . No pertinent past medical history     Patient Active Problem List   Diagnosis Date Noted  . Hand pain, right 12/23/2019  . Healthcare maintenance 12/23/2019  . Pap smear for cervical cancer screening 06/25/2019  . Acute non-recurrent maxillary sinusitis 03/16/2019  . Pelvic cramping 09/21/2018  . Bipolar affective disorder, current episode mixed (HCC) 04/06/2018  . Bacterial vaginosis 06/22/2017  . Loss of weight 11/30/2016  . Insomnia 04/21/2013  . ABNORMAL PAP SMEAR, LGSIL 02/01/2007    Past Surgical History:  Procedure Laterality Date  . COLPOSCOPY  2010  .  DILATION AND CURETTAGE OF UTERUS    . NO PAST SURGERIES       OB History    Gravida  5   Para  3   Term  3   Preterm  0   AB  2   Living  3     SAB  0   IAB  0   Ectopic  0   Multiple  0   Live Births  3           Family History  Problem Relation Age of Onset  . Stroke Mother   . Diabetes Father   . Thyroid disease Maternal Aunt   . Diabetes Maternal Grandmother     Social History   Tobacco Use  . Smoking status: Never Smoker  . Smokeless tobacco: Never Used  Substance Use Topics  . Alcohol use: Yes    Comment: occasional  . Drug use: No    Home Medications Prior to Admission medications   Medication Sig Start Date End Date Taking? Authorizing Provider  escitalopram (LEXAPRO) 5 MG tablet TAKE 2 TABLETS BY MOUTH EVERY DAY 06/12/20   Dana Allan, MD  fluticasone [facility]) 50 MCG/ACT nasal spray Place 2 sprays into both nostrils daily. 02/21/20   Cathie Hoops, Amy V, PA-C  hydrOXYzine (ATARAX/VISTARIL) 10 MG tablet Take 0.5 tablets (5 mg total) by mouth every 8 (  eight) hours as needed. 05/20/20   Dana AllanWalsh, Tanya, MD  ibuprofen (ADVIL) 200 MG tablet Take 1 tablet (200 mg total) by mouth every 6 (six) hours as needed for fever, headache or moderate pain. 03/16/19   Peggyann ShoalsAnderson, Hannah C, DO  ipratropium (ATROVENT) 0.06 % nasal spray Place 2 sprays into both nostrils 4 (four) times daily. 02/21/20   Belinda FisherYu, Amy V, PA-C  levonorgestrel (MIRENA) 20 MCG/24HR IUD 1 each by Intrauterine route once.    [provider]    Allergies    Diflucan [fluconazole]  Review of Systems   Review of Systems  Constitutional: Negative for diaphoresis, fatigue and fever.  HENT: Negative for congestion.   Eyes: Negative for visual disturbance.  Respiratory: Negative for shortness of breath.   Cardiovascular: Negative for palpitations and leg swelling.  Gastrointestinal: Negative for abdominal pain.  Genitourinary: Negative for difficulty urinating.  Musculoskeletal: Positive for  arthralgias and stiffness. Negative for back pain and neck pain.  Skin: Negative for rash.  Neurological: Negative for dizziness.  Psychiatric/Behavioral: Negative for agitation.  All other systems reviewed and are negative.   Physical Exam Updated Vital Signs BP 124/75   Pulse 65   Temp (P) 97.8 F (36.6 C) (Oral)   Resp 14   Ht (P) 5\' 5"  (1.651 m)   Wt (P) 70.4 kg   SpO2 100%   BMI (P) 25.83 kg/m   Physical Exam Vitals and nursing note reviewed.  Constitutional:      General: She is not in acute distress.    Appearance: Normal appearance.  HENT:     Head: Normocephalic and atraumatic.     Nose: Nose normal.  Eyes:     Conjunctiva/sclera: Conjunctivae normal.     Pupils: Pupils are equal, round, and reactive to light.  Cardiovascular:     Rate and Rhythm: Normal rate and regular rhythm.     Pulses: Normal pulses.     Heart sounds: Normal heart sounds.  Pulmonary:     Effort: Pulmonary effort is normal.     Breath sounds: Normal breath sounds.  Abdominal:     General: Abdomen is flat. Bowel sounds are normal.     Palpations: Abdomen is soft.     Tenderness: There is no abdominal tenderness. There is no guarding.  Musculoskeletal:        General: Normal range of motion.       Arms:     Cervical back: Normal range of motion and neck supple. No tenderness.  Skin:    General: Skin is warm and dry.     Capillary Refill: Capillary refill takes less than 2 seconds.  Neurological:     General: No focal deficit present.     Mental Status: She is alert and oriented to person, place, and time.     Sensory: No sensory deficit.     Motor: No weakness.     Deep Tendon Reflexes: Reflexes normal.  Psychiatric:        Mood and Affect: Mood normal.        Behavior: Behavior normal.     ED Results / Procedures / Treatments   Labs (all labs ordered are listed, but only abnormal results are displayed) Results for orders placed or performed during the hospital encounter of  09/21/20  CBC with Differential/Platelet  Result Value Ref Range   WBC 4.9 4.0 - 10.5 K/uL   RBC 4.72 3.87 - 5.11 MIL/uL   Hemoglobin 14.8 12.0 - 15.0 g/dL  HCT 43.5 36.0 - 46.0 %   MCV 92.2 80.0 - 100.0 fL   MCH 31.4 26.0 - 34.0 pg   MCHC 34.0 30.0 - 36.0 g/dL   RDW 66.4 40.3 - 47.4 %   Platelets 310 150 - 400 K/uL   nRBC 0.0 0.0 - 0.2 %   Neutrophils Relative % 42 %   Neutro Abs 2.0 1.7 - 7.7 K/uL   Lymphocytes Relative 47 %   Lymphs Abs 2.4 0.7 - 4.0 K/uL   Monocytes Relative 9 %   Monocytes Absolute 0.5 0.1 - 1.0 K/uL   Eosinophils Relative 1 %   Eosinophils Absolute 0.1 0.0 - 0.5 K/uL   Basophils Relative 1 %   Basophils Absolute 0.0 0.0 - 0.1 K/uL   Immature Granulocytes 0 %   Abs Immature Granulocytes 0.00 0.00 - 0.07 K/uL  Basic metabolic panel  Result Value Ref Range   Sodium 136 135 - 145 mmol/L   Potassium 3.7 3.5 - 5.1 mmol/L   Chloride 101 98 - 111 mmol/L   CO2 26 22 - 32 mmol/L   Glucose, Bld 91 70 - 99 mg/dL   BUN 8 6 - 20 mg/dL   Creatinine, Ser 2.59 0.44 - 1.00 mg/dL   Calcium 9.3 8.9 - 56.3 mg/dL   GFR, Estimated >87 >56 mL/min   Anion gap 9 5 - 15   No results found.  EKG EKG Interpretation  Date/Time:  Saturday Kayman Snuffer 09 2022 02:51:24 EDT Ventricular Rate:  92 PR Interval:  137 QRS Duration: 85 QT Interval:  396 QTC Calculation: 428 R Axis:   61 Text Interpretation: Sinus rhythm Confirmed by Nicanor Alcon, Abbegail Matuska ([id number]) on 09/21/2020 3:59:54 AM   Radiology No results found.  Procedures Procedures   Medications Ordered in ED Medications  lidocaine (LIDODERM) 5 % 2 patch (2 patches Transdermal Patch Applied 09/21/20 0256)  acetaminophen (TYLENOL) tablet 1,000 mg (1,000 mg Oral Given 09/21/20 0255)  ibuprofen (ADVIL) tablet 800 mg (800 mg Oral Given 09/21/20 0255)    ED Course  I have reviewed the triage vital signs and the nursing notes.  Pertinent labs & imaging results that were available during my care of the patient were reviewed by me and  considered in my medical decision making (see chart for details).    Pain is very classically MSK in nature.  It is reproduced with movement and palpation.  Given time course of one day one troponin and negative ekg is sufficient to exclude ACS.  HEART score is 1 very low risk for MACE.  PERC negative wells 0 highly doubt PE in this low risk patient.    Dana Knight was evaluated in Emergency Department on 09/21/2020 for the symptoms described in the history of present illness. She was evaluated in the context of the global COVID-19 pandemic, which necessitated consideration that the patient might be at risk for infection with the SARS-CoV-2 virus that causes COVID-19. Institutional protocols and algorithms that pertain to the evaluation of patients at risk for COVID-19 are in a state of rapid change based on information released by regulatory bodies including the CDC and federal and state organizations. These policies and algorithms were followed during the patient's care in the ED.  Final Clinical Impression(s) / ED Diagnoses Return for intractable cough, coughing up blood, fevers >100.4 unrelieved by medication, shortness of breath, intractable vomiting, chest pain, shortness of breath, weakness, numbness, changes in speech, facial asymmetry, abdominal pain, passing out, Inability to tolerate liquids or food, cough,  altered mental status or any concerns. No signs of systemic illness or infection. The patient is nontoxic-appearing on exam and vital signs are within normal limits.  I have reviewed the triage vital signs and the nursing notes. Pertinent labs & imaging results that were available during my care of the patient were reviewed by me and considered in my medical decision making (see chart for details). After history, exam, and medical workup I feel the patient has been appropriately medically screened and is safe for discharge home. Pertinent diagnoses were discussed with the patient. Patient  was given return precautions.   Lenin Kuhnle, MD 09/21/20 1610

## 2020-12-01 NOTE — Patient Instructions (Signed)
Thank you for coming to see me today. It was a pleasure.   Start Seroquel 50 mg daily, take before going to bed.  We will get some labs today.  If they are abnormal or we need to do something about them, I will call you.  If they are normal, I will send you a message on MyChart (if it is active) or a letter in the mail.  If you don't hear from [place] in 2 weeks, please call the office at the number below.   I have placed an order for your mammogram.  Please call [facility] at [phone] to schedule your appointment within one week.   I have placed an order for Chest xray.  Please go to [facility] at [employer] or at [facility] to have this completed.  You do not need an appointment, but if you would like to call them beforehand, their number is [phone].  We will contact you with your results afterwards.   Referral to psychiatry   Please follow-up with PCP in 2 weeks, I will MyChart you next week to check in.    If you have any questions or concerns, please do not hesitate to call the office at [phone].  Best,   Dana Allan, MD      24- Hour Availability:  [facility] [phone] or [phone] * [facility] (Domestic Violence, Rape, etc. )[phone] Dana Knight [phone] or [phone] * [facility] [phone] (after hours) *[facility] Mobile Crisis Unit [phone] *[place] National Suicide Hotline [phone] Dana Knight)

## 2020-12-01 NOTE — Progress Notes (Signed)
a   SUBJECTIVE:   CHIEF COMPLAINT / HPI: multiple concerns  Cough/Weight loss Had COVID x 2.  Last infected 04/22.  Has not been feeling well since that time. Cough with increasing phlegm.  No shortness of breath.  Reports living situation is not great and thinks may have mold.  Air circulation is poor. Endorses decrease in appetite and unintentional weight loss.  Feels hungry but looses appetite when meal in front of her. Denies any fevers, abdominal pain, bloody stool, diarrhea, or hematuria.  No family history of colon cancer.  Reports first cousin with breast cancer. No recent travel.    Mood disorder Stopped Lexapro as it made her feel funny.  Reports that the only thing that has worked for her is Remeron that she used to take as needed.  She reports only 4-5 hours sleep at night sometimes.  Feels frustrated with her children more often and feels like she is constantly going with no time to rest.  Has not been able to see psychiatrist as she has never had time to make appointment.  She works nights and up most of the day.  Denies any suicidal or homicidal thoughts.  No visual or auditory hallucinations.  Reports she has a sister with BiPolar and doesn't think she has ever been diagnosed with the same thing.    PERTINENT  PMH / PSH:  Mood Disorder Unintentional weight loss   OBJECTIVE:   BP 126/79   Pulse 82   Ht 5\' 5"  (1.651 m)   Wt 151 lb 4 oz (68.6 kg)   SpO2 100%   BMI 25.17 kg/m    General: Alert, no acute distress Cardio: Normal S1 and S2, RRR, no r/m/g Pulm: CTAB, normal work of breathing Abdomen: Bowel sounds normal. Abdomen soft and non-tender.  Extremities: No peripheral edema.  Psych:  speech is fast but organized, makes good eye contact, normal thought process.   ASSESSMENT/PLAN:   Cough Likely post viral cough.  Exam benign. Low suspicion for malignancy given no history of tobacco use Albuterol Inhaler q6h as needed Mucinex BID as needed Chest xray given  weight loss Follow up if no improvement in symptoms  Loss of weight Has had history of unintentional weight loss in setting of uncontrolled depression.  Given that patient has self discontinued Lexapro and continues to have weight loss will work up to rule out other etiologies -TSH, CBC, CMP, HIV, A1c, Quant-TB gold and Chest xray today -Mammogram screening -Follow up with results  Bipolar affective disorder, current episode mixed (HCC) MDQ positive. PHQ 16 and GAD 7 today.  Has self discontinued Lexapro secondary to not feeling well Will trial Seroquel 50 mg daily Referral to Psychiatry for further evaluation Follow up next week with phone call Scheduled appointment 07/06 24 hr Crisis line provided Strict return precautions provided  Health Maintenance PAP due 2026 Mammogram ordered today Hep C screening completed 2021 HIV screening today COVID Vaccine will address at next visit  2022, MD [facility] Medicine [facility]

## 2020-12-04 ENCOUNTER — Encounter: Payer: Self-pay | Admitting: Family Medicine

## 2020-12-04 ENCOUNTER — Ambulatory Visit (INDEPENDENT_AMBULATORY_CARE_PROVIDER_SITE_OTHER): Payer: 59 | Admitting: Family Medicine

## 2020-12-04 ENCOUNTER — Other Ambulatory Visit: Payer: Self-pay

## 2020-12-04 VITALS — BP 126/79 | HR 82 | Ht 65.0 in | Wt 151.2 lb

## 2020-12-04 DIAGNOSIS — R634 Abnormal weight loss: Secondary | ICD-10-CM | POA: Diagnosis not present

## 2020-12-04 DIAGNOSIS — R059 Cough, unspecified: Secondary | ICD-10-CM | POA: Diagnosis not present

## 2020-12-04 DIAGNOSIS — F3160 Bipolar disorder, current episode mixed, unspecified: Secondary | ICD-10-CM

## 2020-12-04 MED ORDER — ALBUTEROL SULFATE HFA 108 (90 BASE) MCG/ACT IN AERS: 2.0000 | INHALATION_SPRAY | Freq: Four times a day (QID) | RESPIRATORY_TRACT | 0 refills | Status: DC | PRN

## 2020-12-04 MED ORDER — QUETIAPINE FUMARATE 50 MG PO TABS: 50.0000 mg | ORAL_TABLET | Freq: Every day | ORAL | 0 refills | Status: DC

## 2020-12-04 MED ORDER — GUAIFENESIN ER 600 MG PO TB12: 600.0000 mg | ORAL_TABLET | Freq: Two times a day (BID) | ORAL | 0 refills | Status: AC

## 2020-12-05 ENCOUNTER — Ambulatory Visit
Admission: RE | Admit: 2020-12-05 | Discharge: 2020-12-05 | Disposition: A | Discharge status: Home / Self Care | Payer: 59 | Source: Ambulatory Visit | Attending: Family Medicine | Admitting: Family Medicine

## 2020-12-05 DIAGNOSIS — R634 Abnormal weight loss: Secondary | ICD-10-CM

## 2020-12-05 LAB — HEMOGLOBIN A1C
Est. average glucose Bld gHb Est-mCnc: 114 mg/dL
Hgb A1c MFr Bld: 5.6 % (ref 4.8–5.6)

## 2020-12-08 ENCOUNTER — Encounter: Payer: Self-pay | Admitting: Family Medicine

## 2020-12-08 DIAGNOSIS — R059 Cough, unspecified: Secondary | ICD-10-CM | POA: Insufficient documentation

## 2020-12-08 NOTE — Assessment & Plan Note (Signed)
Has had history of unintentional weight loss in setting of uncontrolled depression.  Given that patient has self discontinued Lexapro and continues to have weight loss will work up to rule out other etiologies -TSH, CBC, CMP, HIV, A1c, Quant-TB gold and Chest xray today -Mammogram screening -Follow up with results

## 2020-12-08 NOTE — Assessment & Plan Note (Addendum)
Likely post viral cough.  Exam benign. Low suspicion for malignancy given no history of tobacco use Albuterol Inhaler q6h as needed Mucinex BID as needed Chest xray given weight loss Follow up if no improvement in symptoms

## 2020-12-08 NOTE — Assessment & Plan Note (Addendum)
MDQ positive. PHQ 16 and GAD 7 today.  Has self discontinued Lexapro secondary to not feeling well Will trial Seroquel 50 mg daily Referral to Psychiatry for further evaluation Follow up next week with phone call Scheduled appointment 07/06 24 hr Crisis line provided Strict return precautions provided

## 2020-12-09 LAB — CBC WITH DIFFERENTIAL/PLATELET
Basophils Absolute: 0 10*3/uL (ref 0.0–0.2)
Basos: 1 %
EOS (ABSOLUTE): 0.1 10*3/uL (ref 0.0–0.4)
Eos: 1 %
Hematocrit: 38.9 % (ref 34.0–46.6)
Hemoglobin: 13.3 g/dL (ref 11.1–15.9)
Immature Grans (Abs): 0 10*3/uL (ref 0.0–0.1)
Immature Granulocytes: 0 %
Lymphocytes Absolute: 2.8 10*3/uL (ref 0.7–3.1)
Lymphs: 48 %
MCH: 31.4 pg (ref 26.6–33.0)
MCHC: 34.2 g/dL (ref 31.5–35.7)
MCV: 92 fL (ref 79–97)
Monocytes Absolute: 0.5 10*3/uL (ref 0.1–0.9)
Monocytes: 9 %
Neutrophils Absolute: 2.3 10*3/uL (ref 1.4–7.0)
Neutrophils: 41 %
Platelets: 338 10*3/uL (ref 150–450)
RBC: 4.23 x10E6/uL (ref 3.77–5.28)
RDW: 11.9 % (ref 11.7–15.4)
WBC: 5.7 10*3/uL (ref 3.4–10.8)

## 2020-12-09 LAB — COMPREHENSIVE METABOLIC PANEL
ALT: 9 IU/L (ref 0–32)
AST: 17 IU/L (ref 0–40)
Albumin/Globulin Ratio: 1.7 (ref 1.2–2.2)
Albumin: 4.7 g/dL (ref 3.8–4.8)
Alkaline Phosphatase: 53 IU/L (ref 44–121)
BUN/Creatinine Ratio: 12 (ref 9–23)
BUN: 10 mg/dL (ref 6–24)
Bilirubin Total: 0.6 mg/dL (ref 0.0–1.2)
CO2: 24 mmol/L (ref 20–29)
Calcium: 10.2 mg/dL (ref 8.7–10.2)
Chloride: 103 mmol/L (ref 96–106)
Creatinine, Ser: 0.81 mg/dL (ref 0.57–1.00)
Globulin, Total: 2.7 g/dL (ref 1.5–4.5)
Glucose: 107 mg/dL — ABNORMAL HIGH (ref 65–99)
Potassium: 4.5 mmol/L (ref 3.5–5.2)
Sodium: 148 mmol/L — ABNORMAL HIGH (ref 134–144)
Total Protein: 7.4 g/dL (ref 6.0–8.5)
eGFR: 93 mL/min/{1.73_m2} (ref >59)

## 2020-12-09 LAB — QUANTIFERON-TB GOLD PLUS
QuantiFERON Mitogen Value: >10 IU/mL
QuantiFERON Nil Value: 0.04 IU/mL
QuantiFERON TB1 Ag Value: 0.04 IU/mL
QuantiFERON TB2 Ag Value: 0.05 IU/mL
QuantiFERON-TB Gold Plus: NEGATIVE

## 2020-12-09 LAB — TSH: TSH: 1.6 u[IU]/mL (ref 0.450–4.500)

## 2020-12-09 LAB — HIV ANTIBODY (ROUTINE TESTING W REFLEX): HIV Screen 4th Generation wRfx: NONREACTIVE

## 2020-12-12 ENCOUNTER — Telehealth: Payer: Self-pay | Admitting: Family Medicine

## 2020-12-12 NOTE — Telephone Encounter (Signed)
Spoke with patient and is tolerating Seroquel well.  Reports feeling good.  No SI/HI.  Discussed recent lab work and diagnostics all wnl.  Reports still having cough and Albuterol has helped.  Cough now worse at night.  Appointment scheduled for July 6 for follow up.  Will reassess for GERD symptoms and possibly trial Pepcid.  Dana Allan, MD [facility] Res[facility]

## 2020-12-16 NOTE — Progress Notes (Signed)
    SUBJECTIVE:   CHIEF COMPLAINT / HPI:   Presents for follow up for Mood disorder. Seen in clinic on 06/22 and Seroquel 50 mg was initiated for positive MDQ screening and symptoms of BiPolar.  Since then patient reports improvement in symptoms.  Mood has improved.  Not as frustrated and feels like she can function better at work.  Appetite has improved and has gained 2 lbs since last visit..  She is sleeping better after work.   Associated symptoms include none. Denies any SI/HI, auditory or visual hallucinations.   PERTINENT  PMH / PSH:  Bipolar   OBJECTIVE:   BP 110/60   Pulse 61   Ht 5\' 5"  (1.651 m)   Wt 152 lb (68.9 kg)   LMP 12/18/2020 (Exact Date)   SpO2 95%   BMI 25.29 kg/m    General: Alert, no acute distress Cardio: Normal S1 and S2, RRR, no r/m/g Pulm: CTAB, normal work of breathing Psych:  Mood improved, good eye contact, no hyperactivity, speech clear and normal, though process normal.   ASSESSMENT/PLAN:   Bipolar affective disorder, current episode mixed (HCC) Feeling better since starting on Seroquel.  Mo[place] has improved, appetite and sleep also improved.  Discussed that will need to continue to monitor symptoms to adjust medications if needed.  -Will continue Seroquel 50 mg daily -Advised to search for therapist for additional symptom management -Follow up in 2 weeks or sooner if needed -24 hr crisis line provided     02/18/2021, MD [facility]

## 2020-12-16 NOTE — Patient Instructions (Signed)
Thank you for coming to see me today. It was a pleasure.   Glad you are feeling better.  Continue Seroquel 50 mg daily  I recommend finding a therapist soon.  You can look up some therapists on [email or web address]   Please follow-up with PCP in 2 weeks  If you have any questions or concerns, please do not hesitate to call the office at [phone].  Best,   Dana Allan, MD     24- Hour Availability:  [facility]   [phone] or [phone]  [facility] [phone]  [facility] Crisis Service  [phone]   [facility] Crisis Services  [phone] (after hours)  [facility] Alternative/Mobile Crisis   [phone]  [place] National Suicide Hotline  [phone] Len Childs)  Call 911 or go to emergency room  [facility]  [phone]);  [place] and [employer]  [phone]); [place], [place], [place], [place], [place], [place], [place]

## 2020-12-18 ENCOUNTER — Encounter: Payer: Self-pay | Admitting: Family Medicine

## 2020-12-18 ENCOUNTER — Other Ambulatory Visit: Payer: Self-pay

## 2020-12-18 ENCOUNTER — Ambulatory Visit (INDEPENDENT_AMBULATORY_CARE_PROVIDER_SITE_OTHER): Payer: 59 | Admitting: Family Medicine

## 2020-12-18 VITALS — BP 110/60 | HR 61 | Ht 65.0 in | Wt 152.0 lb

## 2020-12-18 DIAGNOSIS — F3160 Bipolar disorder, current episode mixed, unspecified: Secondary | ICD-10-CM

## 2020-12-23 ENCOUNTER — Encounter: Payer: Self-pay | Admitting: Family Medicine

## 2020-12-23 NOTE — Assessment & Plan Note (Signed)
Feeling better since starting on Seroquel.  Dana Knight has improved, appetite and sleep also improved.  Discussed that will need to continue to monitor symptoms to adjust medications if needed.  -Will continue Seroquel 50 mg daily -Advised to search for therapist for additional symptom management -Follow up in 2 weeks or sooner if needed -24 hr crisis line provided

## 2021-01-02 ENCOUNTER — Other Ambulatory Visit: Payer: Self-pay | Admitting: Family Medicine

## 2021-01-06 NOTE — Progress Notes (Deleted)
    SUBJECTIVE:   CHIEF COMPLAINT / HPI:   Presents for follow up for **. Seen in clinic on ** and treated with **.  Since then patient reports improvement in symptoms **. Associated symptoms include **.    PERTINENT  PMH / PSH: ***  OBJECTIVE:   LMP 12/18/2020 (Exact Date)    General: Alert, no acute distress Cardio: Normal S1 and S2, RRR, no r/m/g Pulm: CTAB, normal work of breathing Abdomen: Bowel sounds normal. Abdomen soft and non-tender.  Extremities: No peripheral edema.  Neuro: Cranial nerves grossly intact   ASSESSMENT/PLAN:   No problem-specific Assessment & Plan notes found for this encounter.     Dana Allan, MD [facility]

## 2021-01-08 ENCOUNTER — Ambulatory Visit: Payer: 59 | Admitting: Family Medicine

## 2021-01-09 ENCOUNTER — Ambulatory Visit
Admission: RE | Admit: 2021-01-09 | Discharge: 2021-01-09 | Disposition: A | Discharge status: Home / Self Care | Payer: 59 | Source: Ambulatory Visit | Attending: Family Medicine | Admitting: Family Medicine

## 2021-01-09 ENCOUNTER — Other Ambulatory Visit: Payer: Self-pay

## 2021-01-09 DIAGNOSIS — R634 Abnormal weight loss: Secondary | ICD-10-CM

## 2021-01-27 ENCOUNTER — Other Ambulatory Visit: Payer: Self-pay | Admitting: Family Medicine

## 2021-02-11 ENCOUNTER — Ambulatory Visit (INDEPENDENT_AMBULATORY_CARE_PROVIDER_SITE_OTHER): Payer: 59 | Admitting: Family Medicine

## 2021-02-11 ENCOUNTER — Encounter: Payer: Self-pay | Admitting: Family Medicine

## 2021-02-11 ENCOUNTER — Other Ambulatory Visit: Payer: Self-pay

## 2021-02-11 VITALS — BP 118/78 | HR 77 | Ht 65.0 in | Wt 154.0 lb

## 2021-02-11 DIAGNOSIS — F39 Unspecified mood [affective] disorder: Secondary | ICD-10-CM | POA: Diagnosis not present

## 2021-02-11 NOTE — Patient Instructions (Addendum)
Thank you for coming to see me today. It was a pleasure.   Continue current medications  IUD removal and reinsertion due 2023 in Dec Please follow-up with PCP in 6 months  If you have any questions or concerns, please do not hesitate to call the office at [phone].  Best,   Dana Allan, MD    Psychiatry Resource List (Adults and Children) Most of these providers will take Medicaid. please consult your insurance for a complete and updated list of available providers. When calling to make an appointment have your insurance information available to confirm you are covered.  [facility]ics:   [place]: [address]     [phone] [place]: [address] [place],        [phone] [place]: [address],    [phone] [place] Sharper: [address],                   [phone]  [facility]  (Psychiatry & counseling ; adults & children ; will take Medicaid) [address], [place], [place]        [phone]   [facility]  (Psychiatry only; Adults only, will take Medicaid)  [address], [place], [place] [address]       [phone]   [facility] (Psychiatry & counseling ; adults & children ; will take Medicaid [address]  [place] [address]   [phone]    (Spanish therapist)  [facility]  Psychiatry & counseling; Adults and children;  [address]    [place], [place] [address]    [phone]  [facility] (Psychiatry & counseling; adults & children; Medicare no Medicaid)  [address]   [place], [place]  [address]      [phone]    [facility] (up to age 81)  Psychiatry & counseling ,will take Medicaid, must do counseling to receive psychiatry services  [address] [place] [address]        [phone]  [facility] (Psychiatry & counseling;  adults & children; will take Medicaid) [address],  [place], [place]  [phone]  [facility]---  Walk-in Mon-Fri, 8:30-5:00 (will take Medicaid)  [address] [place], [place]  [phone]    [facility] --- Walk-In Mon-Friday 8am-3pm ( will take Medicaid, Psychiatry, Adults & children,  [address] [place], [place]   [phone]   [facility] [place]--, Walk-in M-F 8am-12pm and 1pm -3pm   (Counseling, Psychiatry, will take Medicaid, adults & children)  [address] [place], [place]  [phone]

## 2021-02-11 NOTE — Progress Notes (Signed)
    SUBJECTIVE:   CHIEF COMPLAINT / HPI: follow up for Mood   Presents for follow up for  mood disorder. Seen in clinic on 07/06 and was tolerating Seroquel 50 mg with good improvement in mood. Since then patient reports significant improvement in symptoms.  Sleeping and appetite has improved.  Has gained 2lbs since last visit and is happy with this gain.  She reports last week was a little stressful as he son was admitted to hospital for septic arthritis. He was discharged today so she is now on her way home with him.  Denies any SI/HI.  Reports mood is good and wants to continue on current dosing of Seroquel.  Has not been able to establish therapist or psychiatrist yet.   PERTINENT  PMH / PSH:  Mood disorder  OBJECTIVE:   BP 118/78   Pulse 77   Ht 5\' 5"  (1.651 m)   Wt 154 lb (69.9 kg)   SpO2 100%   BMI 25.63 kg/m    General: Alert, no acute distress Cardio: Normal S1 and S2, RRR, no r/m/g Pulm: CTAB, normal work of breathing Abdomen: Bowel sounds normal. Abdomen soft and non-tender.  Extremities: No peripheral edema.  Psych: mood good, maintains good eye contact, speech normal, no SI/HI   ASSESSMENT/PLAN:   Mood disorder (HCC) Tolerating medication.  No SI/HI -Continue Seroquel 50 mg qhs -Encouraged to seek therapist -Will monitor for any hypomania, consider adding SSRI if any symptoms occur -Follow up in 6 months or sooner if needed     , MD [facility]

## 2021-02-13 ENCOUNTER — Encounter: Payer: Self-pay | Admitting: Family Medicine

## 2021-02-13 DIAGNOSIS — F39 Unspecified mood [affective] disorder: Secondary | ICD-10-CM | POA: Insufficient documentation

## 2021-02-13 NOTE — Assessment & Plan Note (Signed)
Tolerating medication.  No SI/HI -Continue Seroquel 50 mg qhs -Encouraged to seek therapist -Will monitor for any hypomania, consider adding SSRI if any symptoms occur -Follow up in 6 months or sooner if needed

## 2021-02-25 ENCOUNTER — Other Ambulatory Visit: Payer: Self-pay | Admitting: Family Medicine

## 2021-02-25 ENCOUNTER — Encounter: Payer: Self-pay | Admitting: Family Medicine

## 2021-02-25 MED ORDER — PERMETHRIN 5 % EX CREA: 1.0000 "application " | TOPICAL_CREAM | Freq: Once | CUTANEOUS | 1 refills | Status: AC

## 2021-02-25 NOTE — Progress Notes (Signed)
Prescription for Premethrin 5% cream sent to pharmacy x 1 refill  Dana Allan, MD [facility] Medicine Residency

## 2021-03-24 ENCOUNTER — Encounter: Payer: Self-pay | Admitting: Family Medicine

## 2021-03-25 NOTE — Telephone Encounter (Signed)
She needs to schedule appointment to ne evaluated.  Thanks  Dana Allan, MD Family Medicine Res[facility]

## 2021-07-02 ENCOUNTER — Ambulatory Visit (INDEPENDENT_AMBULATORY_CARE_PROVIDER_SITE_OTHER): Payer: 59 | Admitting: Family Medicine

## 2021-07-02 ENCOUNTER — Encounter: Payer: Self-pay | Admitting: Family Medicine

## 2021-07-02 ENCOUNTER — Other Ambulatory Visit: Payer: Self-pay

## 2021-07-02 VITALS — BP 117/74 | HR 70 | Ht 65.0 in | Wt 158.2 lb

## 2021-07-02 DIAGNOSIS — R5383 Other fatigue: Secondary | ICD-10-CM

## 2021-07-02 DIAGNOSIS — H9202 Otalgia, left ear: Secondary | ICD-10-CM | POA: Diagnosis not present

## 2021-07-02 DIAGNOSIS — F39 Unspecified mood [affective] disorder: Secondary | ICD-10-CM | POA: Diagnosis not present

## 2021-07-02 DIAGNOSIS — L259 Unspecified contact dermatitis, unspecified cause: Secondary | ICD-10-CM

## 2021-07-02 LAB — POCT URINE PREGNANCY: Preg Test, Ur: NEGATIVE

## 2021-07-02 MED ORDER — CETIRIZINE HCL 10 MG PO TABS: 10.0000 mg | ORAL_TABLET | Freq: Every day | ORAL | 11 refills | Status: DC

## 2021-07-02 MED ORDER — HYDROCORTISONE 1 % EX OINT: 1.0000 "application " | TOPICAL_OINTMENT | Freq: Two times a day (BID) | CUTANEOUS | 0 refills | Status: AC

## 2021-07-02 MED ORDER — FLUTICASONE PROPIONATE 50 MCG/ACT NA SUSP: 2.0000 | Freq: Every day | NASAL | 6 refills | Status: AC

## 2021-07-02 MED ORDER — QUETIAPINE FUMARATE 50 MG PO TABS: 75.0000 mg | ORAL_TABLET | Freq: Every day | ORAL | 0 refills | Status: DC

## 2021-07-02 NOTE — Progress Notes (Signed)
° ° °  SUBJECTIVE:   CHIEF COMPLAINT / HPI: increased fatigue and rash  Reports worsening fatigue for past few months  Not sleeping well., 5-6 hrs night.   Endorses missing some doses of Seroquel.  Works third shift.  Having a lot of issues t home with kids. Denies any weight loss, decrease in appetite, SI/HI. Reports that when using Remeron intermittently in the past had helped with sleep. Has not been able to find mental health provider or therapist.  Time is limited.  Rash Noticed rash on left side of neck about 1 week ago.  Itchy.  Denies any fevers, change in body lotion, creams, laundry detergents.  No new environment or changes in food. No recent hot tubs, swimming or travel.  Left ear pain  Ongoing for about 1 week.  Sore throat. Denies any fevers, ear discharge, decrease in hearing, cough, difficulty swallowing, weight loss. Previously prescribed Zyrtec and Flonase but not taking now. No recent sick contacts.  PERTINENT  PMH / PSH:  Mood disorder  OBJECTIVE:   BP 117/74    Pulse 70    Ht 5\' 5"  (1.651 m)    Wt 158 lb 3.2 oz (71.8 kg)    SpO2 100%    BMI 26.33 kg/m    General: Alert, no acute distress HEENT: TM's visible bilaterally, serous fluid visible posterior left TM. No bulging or erythema.  No visible discharge. Left Anterior cervical lymphadenopathy Derm: large scaling hypopigmented patch on right upper anterior chest wall   Psych: Mood norma, makes good eye contact, speech slightly faster than normal, no SI/HI   ASSESSMENT/PLAN:   Mood disorder (HCC) Increase Seroquel 75 mg daily CCM referral to help with connecting with psychiatrist Mental health resources provided Strict return precautions Follow up in 2 weeks  Contact dermatitis Unknown trigger.   -Hydrocortisone cream BID x 7 days then as needed -Follow up if symptoms worsen               Ear pain Likely seasonal allergies -Restart Cetirizine 10 mg daily -Restart Flonase daily -Follow up if no  improvement in symptoms     , MD [facility]

## 2021-07-02 NOTE — Patient Instructions (Signed)
Thank you for coming to see me today. It was a pleasure.   Seroquel increased to 75 mg daily  Start Flonase 2 sprays to each nare daily Start Cetirizine 10 mg daily  Will refer to [facility] health here at [facility] to help with setting up appointment with Psychiatrist  Hydrocortisone cream to rash for 7 days  Please follow-up with 2 weeks  If you have any questions or concerns, please do not hesitate to call the office at [phone].  Best,   Dana Allan, MD

## 2021-07-05 ENCOUNTER — Encounter: Payer: Self-pay | Admitting: Family Medicine

## 2021-07-05 DIAGNOSIS — H9209 Otalgia, unspecified ear: Secondary | ICD-10-CM | POA: Insufficient documentation

## 2021-07-05 DIAGNOSIS — L259 Unspecified contact dermatitis, unspecified cause: Secondary | ICD-10-CM | POA: Insufficient documentation

## 2021-07-05 HISTORY — DX: Otalgia, unspecified ear: H92.09

## 2021-07-05 HISTORY — DX: Unspecified contact dermatitis, unspecified cause: L25.9

## 2021-07-05 NOTE — Assessment & Plan Note (Signed)
Likely seasonal allergies -Restart Cetirizine 10 mg daily -Restart Flonase daily -Follow up if no improvement in symptoms

## 2021-07-05 NOTE — Assessment & Plan Note (Addendum)
Unknown trigger.   -Hydrocortisone cream BID x 7 days then as needed -Follow up if symptoms worsen

## 2021-07-05 NOTE — Assessment & Plan Note (Signed)
Increase Seroquel 75 mg daily CCM referral to help with connecting with psychiatrist Mental health resources provided Strict return precautions Follow up in 2 weeks

## 2021-07-08 ENCOUNTER — Telehealth: Payer: Self-pay | Admitting: *Deleted

## 2021-07-08 NOTE — Chronic Care Management (AMB) (Signed)
°  Care Management   Note  07/08/2021 Name: Dana Knight MRN: [id number] DOB: 10/29/1975  Dana Knight is a 46 y.o. year old female who is a primary care patient of Dana Allan, MD. I reached out to [name] by phone today in response to a referral sent by Ms. [name]'s primary care provider.   [name] was given information about care management services today including:  Care management services include personalized support from designated clinical staff supervised by her physician, including individualized plan of care and coordination with other care providers 24/7 contact phone numbers for assistance for urgent and routine care needs. The patient may stop care management services at any time by phone call to the office staff.  Patient agreed to services and verbal consent obtained.   Follow up plan: Telephone appointment with care management team member scheduled for:07/16/21  [facility] Guide, Embedded Care Coordination [facility]   Care Management  Direct Dial: [phone]

## 2021-07-16 ENCOUNTER — Ambulatory Visit: Payer: 59 | Admitting: Licensed Clinical Social Worker

## 2021-07-17 NOTE — Chronic Care Management (AMB) (Signed)
°   Social Work Note  07/16/2021 Name: Dana Knight MRN: [id number] DOB: 08-25-1975  Dana Knight is a 46 y.o. year old female who is a primary care patient of Dana Allan, MD.  The Care Management team was consulted for assistance with chronic disease management and care coordination needs.  Dana Knight was given information about Care Management services today including:  Care Management services include personalized support from designated clinical staff supervised by her physician, including individualized plan of care and coordination with other care providers 24/7 contact phone numbers for assistance for urgent and routine care needs. The patient may stop care management services at any time (effective at the end of the month) by phone call to the office staff.  Patient agreed to services and consent obtained.   Engaged with patient by telephone for initial visit in response to provider referral for social work chronic care management and care coordination services.  Assessment: Review of patient past medical history, allergies, medications, and health status, including review of pertinent consultant reports was performed as part of comprehensive evaluation and provision of care management/care coordination services.   SDOH (Social Determinants of Health) assessments and interventions performed:  Yes SDOH Interventions    Flowsheet Row Most Recent Value  SDOH Interventions   Stress Interventions Provide Counseling        Advanced Directives Status: Not ready or willing to discuss.  Care Plan  Allergies  Allergen Reactions   Diflucan [Fluconazole] Swelling    Outpatient Encounter Medications as of 07/16/2021  Medication Sig Note   cetirizine (ZYRTEC) 10 MG tablet Take 1 tablet (10 mg total) by mouth daily.    fluticasone (FLONASE) 50 MCG/ACT nasal spray Place 2 sprays into both nostrils daily.    hydrocortisone 1 % ointment Apply 1 application topically 2 (two) times  daily.    levonorgestrel (MIRENA) 20 MCG/24HR IUD 1 each by Intrauterine route once. 02/05/2018: replaced November 2018   QUEtiapine (SEROQUEL) 50 MG tablet Take 1.5 tablets (75 mg total) by mouth at bedtime.    No facility-administered encounter medications on file as of 07/16/2021.    Patient Active Problem List   Diagnosis Date Noted   Contact dermatitis 07/05/2021   Ear pain 07/05/2021   Mood disorder (HCC) 02/13/2021   Healthcare maintenance 12/23/2019   Pap smear for cervical cancer screening 06/25/2019    Conditions to be addressed/monitored: Depression; Limited education about therapist in [place] county*  There are no care plans that you recently modified to display for this patient.    Follow Up Plan: SW will follow up with patient by phone over the next 30 days. Patient will contact  [email or web address] and research a therapist of choice. SW emailed patient resource .      Christen Butter, BSW  Social Worker [facility] Care Management  [phone]

## 2021-07-17 NOTE — Patient Instructions (Signed)
Visit Information  Instructions: patient will work with SW to address concerns related to Mental Health- Therapist.  Patient was given the following information about care management and care coordination services today, agreed to services, and gave verbal consent: 1.care management/care coordination services include personalized support from designated clinical staff supervised by their physician, including individualized plan of care and coordination with other care providers 2. 24/7 contact phone numbers for assistance for urgent and routine care needs. 3. The patient may stop care management/care coordination services at any time by phone call to the office staff.  Patient verbalizes understanding of instructions and care plan provided today and agrees to view in MyChart. Active MyChart status confirmed with patient.    The care management team will reach out to the patient again over the next 30 days.   Christen Butter, BSW  Social Worker [facility] Care Management  [phone]

## 2021-07-21 ENCOUNTER — Encounter: Payer: Self-pay | Admitting: Family Medicine

## 2021-07-21 ENCOUNTER — Ambulatory Visit (INDEPENDENT_AMBULATORY_CARE_PROVIDER_SITE_OTHER): Payer: 59 | Admitting: Family Medicine

## 2021-07-21 ENCOUNTER — Other Ambulatory Visit: Payer: Self-pay

## 2021-07-21 VITALS — BP 113/78 | HR 74 | Ht 65.0 in | Wt 157.4 lb

## 2021-07-21 DIAGNOSIS — F39 Unspecified mood [affective] disorder: Secondary | ICD-10-CM | POA: Diagnosis not present

## 2021-07-21 NOTE — Patient Instructions (Signed)
Thank you for coming to see me today. It was a pleasure.  Start Seroquel 75 mg today If this makes you to drowsy go back to the 50 mg tablets  Schedule an appointment with the mental health provider as soon as possible  Please follow-up with PCP in 2 weeks  If you have any questions or concerns, please do not hesitate to call the office at [phone].  Best,   Dana Allan, MD    Psychiatry Resource List (Adults and Children) Most of these providers will take Medicaid. please consult your insurance for a complete and updated list of available providers. When calling to make an appointment have your insurance information available to confirm you are covered.  [facility]ics:   [place]: [address]     [phone] [place]: [address] [place],        [phone] [place]: [address],    [phone] [place] Sharper: [address],                   [phone]  [facility]  (Psychiatry & counseling ; adults & children ; will take Medicaid) [address], [place], [place]        [phone]   [facility]  (Psychiatry only; Adults only, will take Medicaid)  [address], [place], [place] [address]       [phone]   [facility] (Psychiatry & counseling ; adults & children ; will take Medicaid [address]  [place] [address]   [phone]    (Spanish therapist)  [facility]  Psychiatry & counseling; Adults and children;  [address]    [place], [place] [address]    [phone]  [facility] (Psychiatry & counseling; adults & children; Medicare no Medicaid)  [address]   [place], [place]  [address]      [phone]    [facility] (up to age 27)  Psychiatry & counseling ,will take Medicaid, must do counseling to receive psychiatry services  [address] [place]  [address]        [phone]  [facility] (Psychiatry & counseling; adults & children; will take Medicaid) [address],  [place], [place]  [phone]  [facility]---  Walk-in Mon-Fri, 8:30-5:00 (will take Medicaid)  [address] [place], [place]  [phone]    [facility] --- Walk-In Mon-Friday 8am-3pm ( will take Medicaid, Psychiatry, Adults & children,  [address], [place], [place]   [phone]   [facility]--, Walk-in M-F 8am-12pm and 1pm -3pm   (Counseling, Psychiatry, will take Medicaid, adults & children)  [address] [place], [place]  [phone]

## 2021-07-21 NOTE — Progress Notes (Addendum)
° ° °  SUBJECTIVE:   CHIEF COMPLAINT / HPI: follow up mood  Presents for follow up for mood disorder. Seen in clinic on 01/18 and Seroquel was increased to 75 mg for worsening mood, insomnia, fatigue, increased stress at home.  Since then patient reports that she has continued to take the 50 mg of Seroquel. Symptoms have somewhat improvement but continues to have days where she does not get any sleep.  Has not been able to set up appointment yet with Mental Health provider.  She reports that Remeron has worked for her in the past and would take this as needed. Denies any SI/HI.    PERTINENT  PMH / PSH:  Mood disorder  OBJECTIVE:   BP 113/78    Pulse 74    Ht 5\' 5"  (1.651 m)    Wt 157 lb 6.4 oz (71.4 kg)    SpO2 100%    BMI 26.19 kg/m    General: Alert, no acute distress Psych: Alert, pleasant, mood appropriate.  Speech appropriate, makes good eye contact.     ASSESSMENT/PLAN:   Mood disorder (HCC) PHQ 9 score 12, GAD score 6 Will retrial the Seroquel 75 mg today.  Stressed the importance of finding a mental health provider to evaluate for appropriate medications to manage her mood. Also advised to find a counselor to discuss increasing stress at home with family members and work stress.   Follow up in 2 weeks     , MD [facility]

## 2021-07-24 ENCOUNTER — Encounter: Payer: Self-pay | Admitting: Family Medicine

## 2021-07-24 NOTE — Assessment & Plan Note (Addendum)
PHQ 9 score 12, GAD score 6 Will retrial the Seroquel 75 mg today.  Stressed the importance of finding a mental health provider to evaluate for appropriate medications to manage her mood. Also advised to find a counselor to discuss increasing stress at home with family members and work stress.   Follow up in 2 weeks

## 2021-08-01 ENCOUNTER — Other Ambulatory Visit: Payer: Self-pay

## 2021-08-01 ENCOUNTER — Encounter (HOSPITAL_BASED_OUTPATIENT_CLINIC_OR_DEPARTMENT_OTHER): Payer: Self-pay | Admitting: *Deleted

## 2021-08-01 ENCOUNTER — Emergency Department (HOSPITAL_BASED_OUTPATIENT_CLINIC_OR_DEPARTMENT_OTHER): Payer: 59 | Admitting: Radiology

## 2021-08-01 ENCOUNTER — Emergency Department (HOSPITAL_BASED_OUTPATIENT_CLINIC_OR_DEPARTMENT_OTHER)
Admission: EM | Admit: 2021-08-01 | Discharge: 2021-08-01 | Disposition: A | Discharge status: Home / Self Care | Payer: 59 | Attending: Emergency Medicine | Admitting: Emergency Medicine

## 2021-08-01 ENCOUNTER — Other Ambulatory Visit (HOSPITAL_BASED_OUTPATIENT_CLINIC_OR_DEPARTMENT_OTHER): Payer: Self-pay

## 2021-08-01 DIAGNOSIS — M545 Low back pain, unspecified: Secondary | ICD-10-CM | POA: Diagnosis present

## 2021-08-01 DIAGNOSIS — M5442 Lumbago with sciatica, left side: Secondary | ICD-10-CM | POA: Diagnosis not present

## 2021-08-01 LAB — URINALYSIS, ROUTINE W REFLEX MICROSCOPIC
Bilirubin Urine: NEGATIVE
Glucose, UA: NEGATIVE mg/dL
Hgb urine dipstick: NEGATIVE
Ketones, ur: NEGATIVE mg/dL
Leukocytes,Ua: NEGATIVE
Nitrite: NEGATIVE
Protein, ur: NEGATIVE mg/dL
Specific Gravity, Urine: 1.01 (ref 1.005–1.030)
pH: 5.5 (ref 5.0–8.0)

## 2021-08-01 LAB — PREGNANCY, URINE: Preg Test, Ur: NEGATIVE

## 2021-08-01 MED ORDER — KETOROLAC TROMETHAMINE 30 MG/ML IJ SOLN
30.0000 mg | Freq: Once | INTRAMUSCULAR | Status: AC
  Administered 2021-08-01: 30 mg via INTRAMUSCULAR
  Filled 2021-08-01: qty 1

## 2021-08-01 MED ORDER — MELOXICAM 7.5 MG PO TABS
7.5000 mg | ORAL_TABLET | Freq: Every day | ORAL | 0 refills | Status: DC
  Filled 2021-08-01: qty 10, 10d supply, fill #0

## 2021-08-01 MED ORDER — METHOCARBAMOL 500 MG PO TABS
1000.0000 mg | ORAL_TABLET | Freq: Four times a day (QID) | ORAL | 0 refills | Status: DC
  Filled 2021-08-01: qty 30, 4d supply, fill #0

## 2021-08-01 NOTE — ED Provider Notes (Signed)
[facility] EMERGENCY DEPT Provider Note   CSN: [id number] Arrival date & time: 08/01/21  1128     History  Chief Complaint  Patient presents with   Back Pain    Dana Knight is a 46 y.o. female.  Patient with no significant past medical history presents to the emergency department today for evaluation of bilateral low back pain with radiation down the left buttocks and thigh.  Patient states that she awoke yesterday with pain.  She denies any preceding injuries or traumas.  She has not had pain like this in the past.  She took Tylenol without much improvement.  Pain persisted today prompting evaluation.  She works third shift at [employer].  She does not do any heavy lifting, pushing or pulling.  Patient denies warning symptoms of back pain including: fecal incontinence, urinary retention or overflow incontinence, night sweats, waking from sleep with back pain, unexplained fevers or weight loss, h/o cancer, IVDU, recent trauma.        Home Medications Prior to Admission medications   Medication Sig Start Date End Date Taking? Authorizing Provider  cetirizine (ZYRTEC) 10 MG tablet Take 1 tablet (10 mg total) by mouth daily. 07/02/21   Dana Allan, MD  fluticasone (FLONASE) 50 MCG/ACT nasal spray Place 2 sprays into both nostrils daily. 07/02/21   Dana Allan, MD  hydrocortisone 1 % ointment Apply 1 application topically 2 (two) times daily. 07/02/21   Dana Allan, MD  levonorgestrel (MIRENA) 20 MCG/24HR IUD 1 each by Intrauterine route once.    [provider]  QUEtiapine (SEROQUEL) 50 MG tablet Take 1.5 tablets (75 mg total) by mouth at bedtime. 07/02/21 08/01/21  Dana Allan, MD      Allergies    Diflucan [fluconazole]    Review of Systems   Review of Systems  Physical Exam Updated Vital Signs BP 127/77 (BP Location: Right Arm)    Pulse 73    Temp 98.8 F (37.1 C)    Resp 16    SpO2 100%  Physical Exam Vitals and nursing note reviewed.   Constitutional:      Appearance: She is well-developed.  HENT:     Head: Normocephalic and atraumatic.  Eyes:     Conjunctiva/sclera: Conjunctivae normal.  Pulmonary:     Effort: Pulmonary effort is normal.  Abdominal:     Palpations: Abdomen is soft.     Tenderness: There is no abdominal tenderness.  Musculoskeletal:        General: Normal range of motion.     Cervical back: Normal range of motion and neck supple.     Comments: No step-off noted with palpation of spine.  Bilateral paraspinous tenderness over the lower lumbar spine and sacral spine area, left greater than right.  Skin:    General: Skin is warm and dry.     Findings: No rash.  Neurological:     Mental Status: She is alert.     Sensory: No sensory deficit.     Deep Tendon Reflexes: Reflexes are normal and symmetric.     Comments: 5/5 strength in entire lower extremities bilaterally. No sensation deficit.   Patient is able to stand from a sitting position without assistance.  She walks several steps without foot drop.  She is in some pain though with this and ambulates slowly.    ED Results / Procedures / Treatments   Labs (all labs ordered are listed, but only abnormal results are displayed) Labs Reviewed  URINALYSIS, ROUTINE W  REFLEX MICROSCOPIC  PREGNANCY, URINE    EKG None  Radiology No results found.  Procedures Procedures    Medications Ordered in ED Medications  ketorolac (TORADOL) 30 MG/ML injection 30 mg (has no administration in time range)    ED Course/ Medical Decision Making/ A&P    Patient seen and examined. History obtained directly from patient. Work-up including labs, imaging, EKG ordered in triage, if performed, were reviewed.    Labs/EKG: Independently reviewed and interpreted.  This included: UA negative  Imaging: Discussed limited utility of lower back films with patient.  She would like reassurance and would prefer if we did an x-ray today.  I ordered this.     Medications/Fluids: Ordered: IM Toradol   Most recent vital signs reviewed and are as follows: BP 127/77 (BP Location: Right Arm)    Pulse 73    Temp 98.8 F (37.1 C)    Resp 16    SpO2 100%   Initial impression: Left-sided lumbosacral radiculopathy, no red flags.  2:48 PM Reassessment performed. Patient appears comfortable, exam unchanged.   Labs and imaging personally reviewed and interpreted including: Negative lumbar spine images, agree no fracture.  Most current vital signs reviewed and are as follows: BP 127/77 (BP Location: Right Arm)    Pulse 73    Temp 98.8 F (37.1 C)    Resp 16    SpO2 100%   Plan: Discharge to home  Home treatment: Prescription written for Robaxin, meloxicam.   Return and follow-up instructions:  Patient was counseled on back pain precautions and told to do activity as tolerated but do not lift, push, or pull heavy objects more than 10 pounds for the next week.  Patient counseled to use ice or heat on back for no longer than 15 minutes every hour.   Patient counseled on proper use of muscle relaxant medication.  They were told not to drink alcohol, drive any vehicle, or do any dangerous activities while taking this medication.  Patient verbalized understanding.  Patient urged to follow-up with PCP if pain does not improve with treatment and rest or if pain becomes recurrent. Urged to return with worsening severe pain, loss of bowel or bladder control, trouble walking.                               Medical Decision Making Amount and/or Complexity of Data Reviewed Labs: ordered. Radiology: ordered.  Risk Prescription drug management.   Patient with back pain, no red flags.  X-rays negative.  No neurological deficits. Patient is ambulatory. No warning symptoms of back pain including: fecal incontinence, urinary retention or overflow incontinence, night sweats, waking from sleep with back pain, unexplained fevers or weight loss, h/o cancer, IVDU,  recent trauma. No concern for cauda equina, epidural abscess, or other serious cause of back pain. Conservative measures such as rest, ice/heat and pain medicine indicated with PCP follow-up if no improvement with conservative management.          Final Clinical Impression(s) / ED Diagnoses Final diagnoses:  Acute bilateral low back pain with left-sided sciatica    Rx / DC Orders ED Discharge Orders          Ordered    meloxicam (MOBIC) 7.5 MG tablet  Daily        08/01/21 1439    methocarbamol (ROBAXIN) 500 MG tablet  4 times daily        08/01/21 1439  Renne Crigler, PA-C 08/01/21 1450    Tanda Rockers A, DO 08/01/21 1556

## 2021-08-01 NOTE — ED Triage Notes (Signed)
Pt is here for lower back pain with sharp pain radiating into hips.  Pain worst when changing positions.  Pt denies any trauma or injury

## 2021-08-01 NOTE — Discharge Instructions (Signed)
Please read and follow all provided instructions.  Your diagnoses today include:  1. Acute bilateral low back pain with left-sided sciatica     Tests performed today include: Vital signs - see below for your results today X-rays of your lower back: Were negative for fracture or other problems  Medications prescribed:  Meloxicam - anti-inflammatory pain medication  You have been prescribed an anti-inflammatory medication or NSAID. Take with food. Do not take aspirin, ibuprofen, or naproxen if taking this medication. Take smallest effective dose for the shortest duration needed for your pain. Stop taking if you experience stomach pain or vomiting.   Robaxin (methocarbamol) - muscle relaxer medication  DO NOT drive or perform any activities that require you to be awake and alert because this medicine can make you drowsy.   Take any prescribed medications only as directed.  Home care instructions:  Follow any educational materials contained in this packet Please rest, use ice or heat on your back for the next several days Do not lift, push, pull anything more than 10 pounds for the next week  Follow-up instructions: Please follow-up with your primary care provider in the next 5-7 days for further evaluation of your symptoms if not improving.   Return instructions:  SEEK IMMEDIATE MEDICAL ATTENTION IF YOU HAVE: New numbness, tingling, weakness, or problem with the use of your arms or legs Severe back pain not relieved with medications Loss control of your bowels or bladder Increasing pain in any areas of the body (such as chest or abdominal pain) Shortness of breath, dizziness, or fainting.  Worsening nausea (feeling sick to your stomach), vomiting, fever, or sweats Any other emergent concerns regarding your health   Additional Information:  Your vital signs today were: BP 127/77 (BP Location: Right Arm)    Pulse 73    Temp 98.8 F (37.1 C)    Resp 16    SpO2 100%  If your  blood pressure (BP) was elevated above 135/85 this visit, please have this repeated by your doctor within one month. --------------

## 2021-08-01 NOTE — ED Notes (Signed)
EMT-P provided AVS using Teachback Method. Patient verbalizes understanding of Discharge Instructions. Opportunity for Questioning and Answers were provided by EMT-P. Patient Discharged from ED.  ? ?

## 2021-08-13 ENCOUNTER — Emergency Department (HOSPITAL_COMMUNITY): Payer: 59

## 2021-08-13 ENCOUNTER — Encounter (HOSPITAL_COMMUNITY): Payer: Self-pay | Admitting: Emergency Medicine

## 2021-08-13 ENCOUNTER — Other Ambulatory Visit: Payer: Self-pay

## 2021-08-13 ENCOUNTER — Emergency Department (HOSPITAL_COMMUNITY)
Admission: EM | Admit: 2021-08-13 | Discharge: 2021-08-13 | Disposition: A | Discharge status: Home / Self Care | Payer: 59 | Attending: Emergency Medicine | Admitting: Emergency Medicine

## 2021-08-13 DIAGNOSIS — Z20822 Contact with and (suspected) exposure to covid-19: Secondary | ICD-10-CM | POA: Diagnosis not present

## 2021-08-13 DIAGNOSIS — R051 Acute cough: Secondary | ICD-10-CM

## 2021-08-13 DIAGNOSIS — J40 Bronchitis, not specified as acute or chronic: Secondary | ICD-10-CM | POA: Diagnosis not present

## 2021-08-13 DIAGNOSIS — R059 Cough, unspecified: Secondary | ICD-10-CM | POA: Diagnosis present

## 2021-08-13 DIAGNOSIS — M545 Low back pain, unspecified: Secondary | ICD-10-CM | POA: Diagnosis not present

## 2021-08-13 LAB — CBC WITH DIFFERENTIAL/PLATELET
Abs Immature Granulocytes: 0.01 10*3/uL (ref 0.00–0.07)
Basophils Absolute: 0 10*3/uL (ref 0.0–0.1)
Basophils Relative: 1 %
Eosinophils Absolute: 0.1 10*3/uL (ref 0.0–0.5)
Eosinophils Relative: 3 %
HCT: 40.3 % (ref 36.0–46.0)
Hemoglobin: 13.1 g/dL (ref 12.0–15.0)
Immature Granulocytes: 0 %
Lymphocytes Relative: 43 %
Lymphs Abs: 2.1 10*3/uL (ref 0.7–4.0)
MCH: 31.4 pg (ref 26.0–34.0)
MCHC: 32.5 g/dL (ref 30.0–36.0)
MCV: 96.6 fL (ref 80.0–100.0)
Monocytes Absolute: 0.5 10*3/uL (ref 0.1–1.0)
Monocytes Relative: 10 %
Neutro Abs: 2.1 10*3/uL (ref 1.7–7.7)
Neutrophils Relative %: 43 %
Platelets: 337 10*3/uL (ref 150–400)
RBC: 4.17 MIL/uL (ref 3.87–5.11)
RDW: 12.1 % (ref 11.5–15.5)
WBC: 4.8 10*3/uL (ref 4.0–10.5)
nRBC: 0 % (ref 0.0–0.2)

## 2021-08-13 LAB — RESP PANEL BY RT-PCR (FLU A&B, COVID) ARPGX2
Influenza A by PCR: NEGATIVE
Influenza B by PCR: NEGATIVE
SARS Coronavirus 2 by RT PCR: NEGATIVE

## 2021-08-13 LAB — BASIC METABOLIC PANEL
Anion gap: 5 (ref 5–15)
BUN: 12 mg/dL (ref 6–20)
CO2: 28 mmol/L (ref 22–32)
Calcium: 9.1 mg/dL (ref 8.9–10.3)
Chloride: 104 mmol/L (ref 98–111)
Creatinine, Ser: 0.73 mg/dL (ref 0.44–1.00)
GFR, Estimated: >60 mL/min (ref >60)
Glucose, Bld: 97 mg/dL (ref 70–99)
Potassium: 3.9 mmol/L (ref 3.5–5.1)
Sodium: 137 mmol/L (ref 135–145)

## 2021-08-13 MED ORDER — PREDNISONE 10 MG PO TABS
20.0000 mg | ORAL_TABLET | Freq: Every day | ORAL | 0 refills | Status: AC
Start: 2021-08-13 — End: 2021-08-17

## 2021-08-13 NOTE — ED Provider Triage Note (Addendum)
Emergency Medicine Provider Triage Evaluation Note ? ?Dana Knight , a 46 y.o. female  was evaluated in triage.  Pt complains of cough and hoarseness since Thursday.  Currently taking Mucinex.  Endorses chest tightness.  Denies fever, nausea, vomiting, SOB.  Seen for low back pain a few days ago in ED. ? ?Review of Systems  ?Positive: Cough, chest tightness ?Negative: Fever ? ?Physical Exam  ?BP 136/74 (BP Location: Left Arm)   Pulse (!) 103   Temp 98.1 ?F (36.7 ?C) (Oral)   Resp 16   Ht 5\' 5"  (1.651 m)   Wt 74.8 kg   SpO2 98%   BMI 27.46 kg/m?  ?Gen:   Awake, no distress   ?Resp:  Normal effort ?MSK:   Moves extremities without difficulty  ?Other:  Hoarseness with mild cervical LAD, tachycardic ? ?Medical Decision Making  ?Medically screening exam initiated at 11:59 AM.  Appropriate orders placed.  was informed that the remainder of the evaluation will be completed by another provider, this initial triage assessment does not replace that evaluation, and the importance of remaining in the ED until their evaluation is complete. ? ?Labs and imaging ordered ?  ?Dana Agee, PA-C ?08/13/21 1209 ? ?  ?1210, PA-C ?08/13/21 1211 ? ?

## 2021-08-13 NOTE — Discharge Instructions (Addendum)
Viral Illness ?TREATMENT  ?May take the prednisone that I have prescribed you.  Drink plenty of fluids warm tea with honey.  Return to ER if you notice that you are coughing up blood having shortness of breath or any other new or concerning symptoms. ?Treatment is directed at relieving symptoms. There is no cure. Antibiotics are not effective, because the infection is caused by a virus, not by bacteria. Treatment may include:  ?Increased fluid intake. Sports drinks offer valuable electrolytes, sugars, and fluids.  ?Breathing heated mist or steam (vaporizer or shower).  ?Eating chicken soup or other clear broths, and maintaining good nutrition.  ?Getting plenty of rest.  ?Using gargles or lozenges for comfort.  ?Increasing usage of your inhaler if you have asthma.  ?Return to work when your temperature has returned to normal.  ?Gargle warm salt water and spit it out for sore throat. Take benadryl to decrease sinus secretions. Continue to alternate between Tylenol and ibuprofen for pain and fever control. ? ?Follow Up: Follow up with your primary care doctor in 5-7 days for recheck of ongoing symptoms.  Return to emergency department for emergent changing or worsening of symptoms.  ?

## 2021-08-13 NOTE — ED Provider Notes (Signed)
?[facility]-EMERGENCY DEPT ?Provider Note ? ? ?CSN: [id number] ?Arrival date & time: 08/13/21  1128 ? ?  ? ?History ? ?Chief Complaint  ?Patient presents with  ? Cough  ? Back Pain  ? ? ?Dana Knight is a 46 y.o. female. ? ? ?Cough ?Back Pain ? ?Patient is a 46 year old female with past medical history significant for allergies, anxiety, bipolar, mood disorder ? ?She is present emergency room today with complaints of low back pain for the past couple weeks.  She states she also came to the ER because she has had some cough and congestion for the past week.  She denies any chest pain or difficulty breathing.  She states that she has no nausea vomiting diarrhea fevers chills lightheadedness or dizziness. ? ?No other associate symptoms.  No aggravating mitigating factors. ? ?  ? ?Home Medications ?Prior to Admission medications   ?Medication Sig Start Date End Date Taking? Authorizing Provider  ?predniSONE (DELTASONE) 10 MG tablet Take 2 tablets (20 mg total) by mouth daily for 4 days. 08/13/21 08/17/21 Yes Ladarrian Asencio, Rodrigo Ran, PA  ?cetirizine (ZYRTEC) 10 MG tablet Take 1 tablet (10 mg total) by mouth daily. 07/02/21   Dana Allan, MD  ?fluticasone Aleda Grana) 50 MCG/ACT nasal spray Place 2 sprays into both nostrils daily. 07/02/21   Dana Allan, MD  ?hydrocortisone 1 % ointment Apply 1 application topically 2 (two) times daily. 07/02/21   Dana Allan, MD  ?levonorgestrel (MIRENA) 20 MCG/24HR IUD 1 each by Intrauterine route once.    [provider]  ?meloxicam (MOBIC) 7.5 MG tablet Take 1 tablet (7.5 mg total) by mouth daily. 08/01/21   Renne Crigler, PA-C  ?methocarbamol (ROBAXIN) 500 MG tablet Take 2 tablets (1,000 mg total) by mouth 4 (four) times daily. 08/01/21   Renne Crigler, PA-C  ?QUEtiapine (SEROQUEL) 50 MG tablet Take 1.5 tablets (75 mg total) by mouth at bedtime. 07/02/21 08/01/21  Dana Allan, MD  ?   ? ?Allergies    ?Diflucan [fluconazole]   ? ?Review of Systems   ?Review of Systems   ?Respiratory:  Positive for cough.   ?Musculoskeletal:  Positive for back pain.  ? ?Physical Exam ?Updated Vital Signs ?BP 136/74 (BP Location: Left Arm)   Pulse 74   Temp 98.1 ?F (36.7 ?C) (Oral)   Resp 16   Ht 5\' 5"  (1.651 m)   Wt 74.8 kg   SpO2 98%   BMI 27.46 kg/m?  ?Physical Exam ?Vitals and nursing note reviewed.  ?Constitutional:   ?   General: She is not in acute distress. ?HENT:  ?   Head: Normocephalic and atraumatic.  ?   Nose: Nose normal.  ?Eyes:  ?   General: No scleral icterus. ?Cardiovascular:  ?   Rate and Rhythm: Normal rate and regular rhythm.  ?   Pulses: Normal pulses.  ?   Heart sounds: Normal heart sounds.  ?Pulmonary:  ?   Effort: Pulmonary effort is normal. No respiratory distress.  ?   Breath sounds: No wheezing.  ?Abdominal:  ?   Palpations: Abdomen is soft.  ?   Tenderness: There is no abdominal tenderness.  ?Musculoskeletal:  ?   Cervical back: Normal range of motion.  ?   Right lower leg: No edema.  ?   Left lower leg: No edema.  ?   Comments: Muscular tenderness to palpation of low back.  No midline C, T, L-spine tenderness.  ?Skin: ?   General: Skin is warm and dry.  ?  Capillary Refill: Capillary refill takes less than 2 seconds.  ?Neurological:  ?   Mental Status: She is alert. Mental status is at baseline.  ?Psychiatric:     ?   Mood and Affect: Mood normal.     ?   Behavior: Behavior normal.  ? ? ?ED Results / Procedures / Treatments   ?Labs ?(all labs ordered are listed, but only abnormal results are displayed) ?Labs Reviewed  ?RESP PANEL BY RT-PCR (FLU A&B, COVID) [id number]  ?BASIC METABOLIC PANEL  ?CBC WITH DIFFERENTIAL/PLATELET  ? ? ?EKG ?None ? ?Radiology ?DG Chest 2 View ? ?Result Date: 08/13/2021 ?CLINICAL DATA:  Cough and chest tightness. EXAM: CHEST - 2 VIEW COMPARISON:  Chest x-ray dated December 05, 2020. FINDINGS: The heart size and mediastinal contours are within normal limits. Both lungs are clear. The visualized skeletal structures are unremarkable. IMPRESSION: No  active cardiopulmonary disease. Electronically Signed   By: Obie Dredge M.D.   On: 08/13/2021 12:23   ? ?Procedures ?Procedures  ? ? ?Medications Ordered in ED ?Medications - No data to display ? ?ED Course/ Medical Decision Making/ A&P ?Clinical Course as of 08/14/21 1321  ?Wed Aug 13, 2021  ?1357 I peronsally reviewed CXR which is WNLs [WF]  ?  ?Clinical Course User Index ?[WF] Gailen Shelter, [place]  ? ?                        ?Medical Decision Making ?Risk ?Prescription drug management. ? ? ?Patient is a 46 year old female with past medical history significant for allergies, anxiety, bipolar, mood disorder ? ?She is present emergency room today with complaints of low back pain for the past couple weeks.  She states she also came to the ER because she has had some cough and congestion for the past week.  She denies any chest pain or difficulty breathing.  She states that she has no nausea vomiting diarrhea fevers chills lightheadedness or dizziness. ? ?No other associate symptoms.  No aggravating mitigating factors. ? ? ?She is requesting prednisone.  I think it is reasonable that this may be bronchitis given her symptoms ? ?We will discharge home with conservative therapy and recommend that she continue using antitussives that she has Tylenol ibuprofen return precautions given.  She will follow-up with primary care. ? ? ?Final Clinical Impression(s) / ED Diagnoses ?Final diagnoses:  ?Acute cough  ?Bronchitis  ? ? ?Rx / DC Orders ?ED Discharge Orders   ? ?      Ordered  ?  predniSONE (DELTASONE) 10 MG tablet  Daily       ? 08/13/21 1358  ? ?  ?  ? ?  ? ? ?  ?Gailen Shelter, [place] ?08/14/21 1359 ? ?  ?Mancel Bale, MD ?08/15/21 1038 ? ?

## 2021-08-13 NOTE — ED Triage Notes (Signed)
Reports L lower back pain x2 weeks and a sickness since last Thursday. Has taken 2 Covid test and they were negative, sore throat, HA, and cough. Reports chest tightness. Denies N/V/D.  ?

## 2021-08-31 ENCOUNTER — Other Ambulatory Visit: Payer: Self-pay | Admitting: Family Medicine

## 2021-09-01 ENCOUNTER — Other Ambulatory Visit: Payer: Self-pay

## 2021-09-01 ENCOUNTER — Ambulatory Visit (INDEPENDENT_AMBULATORY_CARE_PROVIDER_SITE_OTHER): Payer: 59 | Admitting: Family Medicine

## 2021-09-01 ENCOUNTER — Encounter: Payer: Self-pay | Admitting: Family Medicine

## 2021-09-01 VITALS — BP 110/78 | HR 73 | Wt 163.2 lb

## 2021-09-01 DIAGNOSIS — R058 Other specified cough: Secondary | ICD-10-CM

## 2021-09-01 DIAGNOSIS — F39 Unspecified mood [affective] disorder: Secondary | ICD-10-CM

## 2021-09-01 MED ORDER — ALBUTEROL SULFATE HFA 108 (90 BASE) MCG/ACT IN AERS: 2.0000 | INHALATION_SPRAY | RESPIRATORY_TRACT | 2 refills | Status: AC | PRN

## 2021-09-01 MED ORDER — CETIRIZINE HCL 10 MG PO TABS: 10.0000 mg | ORAL_TABLET | Freq: Every day | ORAL | 11 refills | Status: DC

## 2021-09-01 MED ORDER — ALBUTEROL SULFATE HFA 108 (90 BASE) MCG/ACT IN AERS: 2.0000 | INHALATION_SPRAY | Freq: Four times a day (QID) | RESPIRATORY_TRACT | 2 refills | Status: DC | PRN

## 2021-09-01 NOTE — Progress Notes (Signed)
? ? ?  SUBJECTIVE:  ? ?CHIEF COMPLAINT / HPI:  ? ?Presents for follow up for mood disorder. Seen in clinic on 02/06 and patient had not increased Seroquel to 75 mg from last visit.  Was decided to start the 75 mg that day.  Since then patient reports improvement in symptoms but has been taking 100 mg.  Denies any SI/HI.  Has not been able to establish behavorial health provider yet.  ? ?Cough ?Ongoing cough for 3 weeks.  Recently evaluated in ED for possible bronchitis and treated with course of Prednisone.  Chest xray at that time negative for acute pulmonary disease.  She reports having difficulty taking in a breath and post nasal drainage.  Denies any chest pain, fevers, sputum production.  Has been using her cousins Albuterol which helps but doesn't last long.  ? ?PERTINENT  PMH / PSH:  ?MDD ? ?OBJECTIVE:  ? ?BP 110/78   Pulse 73   Wt 163 lb 3.2 oz (74 kg)   SpO2 98%   BMI 27.16 kg/m?   ? ?General: Alert, no acute distress ?Cardio: Normal S1 and S2, RRR, no r/m/g ?Pulm: CTAB, normal work of breathing ? ?ASSESSMENT/PLAN:  ? ?Mood disorder ([place]) ?PHQ 9 score 6, GAD score 7 ?Continue Seroquel 75 mg qhs ?Provided mental health resources ?Encourage patient to follow up with scheduling appointment with behavorial health ?Follow up as needed ? ?Upper airway cough syndrome ?Less likely bacterial PNA given no fevers, no sputum production and postnasal drainage ?Continue Albuterol 2 puffs q4h prn  ?Continue Flonase daily ?Continue Zyrtec 10 mg daily ?Follow up in 1-2 weeks or sooner if symptoms worsen ?Strict return precautions provided ?  ? ? ?Carollee Leitz, MD ?[place]  ?

## 2021-09-01 NOTE — Patient Instructions (Addendum)
Thank you for coming to see me today. It was a pleasure. Today we talked about:  ? ?Continue Seroquel 75 mg daily ? ?When you follow-up with, ?[facility] ?[address] [place], [place] [address] ?[phone] ? ?They are open 24 hours and you can walk-in to see behavioral health management provider.  They can also set you up with an appointment for continued care. ? ?If you have any worsening shortness of breath, develop any fevers please go to urgent care for evaluation. ? ?Please follow-up with PCP in 1-2 weeks ? ?If you have any questions or concerns, please do not hesitate to call the office at [phone]. ? ?Best,  ? ?Dana Allan, MD   ? ?Postnasal Drip ?Postnasal drip is the feeling of mucus going down the back of your throat. Mucus is a slimy substance that moistens and cleans your nose and throat, as well as the air pockets in face bones near your forehead and cheeks (sinuses). Small amounts of mucus pass from your nose and sinuses down the back of your throat all the time. This is normal. When you produce too much mucus or the mucus gets too thick, you can feel it. ?Some common causes of postnasal drip include: ?Having more mucus because of: ?A cold or the flu. ?Allergies. ?Cold air. ?Certain medicines. ?Having more mucus that is thicker because of: ?A sinus or nasal infection. ?Dry air. ?A food allergy. ?Follow these instructions at home: ?Relieving discomfort ? ?Gargle with a salt-water mixture 3-4 times a day or as needed. To make a salt-water mixture, completely dissolve ?-1 tsp of salt in 1 cup of warm water. ?If the air in your home is dry, use a humidifier to add moisture to the air. ?Use a saline spray or container (neti pot) to flush out the nose (nasal irrigation). These methods can help clear away mucus and keep the nasal passages moist. ?General instructions ?Take over-the-counter and prescription medicines only as told by your health care provider. ?Follow  instructions from your health care provider about eating or drinking restrictions. You may need to avoid caffeine. ?Avoid things that you know you are allergic to (allergens), like dust, mold, pollen, pets, or certain foods. ?Drink enough fluid to keep your urine pale yellow. ?Keep all follow-up visits as told by your health care provider. This is important. ?Contact a health care provider if: ?You have a fever. ?You have a sore throat. ?You have difficulty swallowing. ?You have headache. ?You have sinus pain. ?You have a cough that does not go away. ?The mucus from your nose becomes thick and is green or yellow in color. ?You have cold or flu symptoms that last more than 10 days. ?Summary ?Postnasal drip is the feeling of mucus going down the back of your throat. ?If your health care provider approves, use nasal irrigation or a nasal spray 2?4 times a day. ?Avoid things that you know you are allergic to (allergens), like dust, mold, pollen, pets, or certain foods. ?This information is not intended to replace advice given to you by your health care provider. Make sure you discuss any questions you have with your health care provider. ?Document Revised: 03/12/2020 Document Reviewed: 03/12/2020 ?Elsevier Patient Education ? 2022 Elsevier Inc. ? ? ? ?

## 2021-09-03 ENCOUNTER — Encounter: Payer: Self-pay | Admitting: Family Medicine

## 2021-09-03 DIAGNOSIS — R058 Other specified cough: Secondary | ICD-10-CM

## 2021-09-03 HISTORY — DX: Other specified cough: R05.8

## 2021-09-03 NOTE — Assessment & Plan Note (Signed)
PHQ 9 score 6, GAD score 7 ?Continue Seroquel 75 mg qhs ?Provided mental health resources ?Encourage patient to follow up with scheduling appointment with behavorial health ?Follow up as needed ?

## 2021-09-03 NOTE — Assessment & Plan Note (Addendum)
Less likely bacterial PNA given no fevers, no sputum production and postnasal drainage ?Continue Albuterol 2 puffs q4h prn  ?Continue Flonase daily ?Continue Zyrtec 10 mg daily ?Follow up in 1-2 weeks or sooner if symptoms worsen ?Strict return precautions provided ?

## 2021-09-15 ENCOUNTER — Ambulatory Visit: Payer: 59 | Admitting: Family Medicine

## 2021-09-15 NOTE — Progress Notes (Deleted)
No show

## 2021-11-14 ENCOUNTER — Other Ambulatory Visit: Payer: Self-pay

## 2021-11-14 ENCOUNTER — Encounter (HOSPITAL_BASED_OUTPATIENT_CLINIC_OR_DEPARTMENT_OTHER): Payer: Self-pay | Admitting: *Deleted

## 2021-11-14 ENCOUNTER — Emergency Department (HOSPITAL_BASED_OUTPATIENT_CLINIC_OR_DEPARTMENT_OTHER)
Admission: EM | Admit: 2021-11-14 | Discharge: 2021-11-14 | Disposition: A | Discharge status: Home / Self Care | Payer: 59 | Attending: Emergency Medicine | Admitting: Emergency Medicine

## 2021-11-14 DIAGNOSIS — M791 Myalgia, unspecified site: Secondary | ICD-10-CM

## 2021-11-14 DIAGNOSIS — Z79899 Other long term (current) drug therapy: Secondary | ICD-10-CM | POA: Insufficient documentation

## 2021-11-14 DIAGNOSIS — R1030 Lower abdominal pain, unspecified: Secondary | ICD-10-CM

## 2021-11-14 DIAGNOSIS — R519 Headache, unspecified: Secondary | ICD-10-CM | POA: Diagnosis not present

## 2021-11-14 DIAGNOSIS — S30860A Insect bite (nonvenomous) of lower back and pelvis, initial encounter: Secondary | ICD-10-CM

## 2021-11-14 DIAGNOSIS — M79605 Pain in left leg: Secondary | ICD-10-CM | POA: Insufficient documentation

## 2021-11-14 DIAGNOSIS — W57XXXA Bitten or stung by nonvenomous insect and other nonvenomous arthropods, initial encounter: Secondary | ICD-10-CM | POA: Diagnosis not present

## 2021-11-14 LAB — COMPREHENSIVE METABOLIC PANEL
ALT: 11 U/L (ref 0–44)
AST: 14 U/L — ABNORMAL LOW (ref 15–41)
Albumin: 4.4 g/dL (ref 3.5–5.0)
Alkaline Phosphatase: 37 U/L — ABNORMAL LOW (ref 38–126)
Anion gap: 7 (ref 5–15)
BUN: 11 mg/dL (ref 6–20)
CO2: 30 mmol/L (ref 22–32)
Calcium: 9.3 mg/dL (ref 8.9–10.3)
Chloride: 103 mmol/L (ref 98–111)
Creatinine, Ser: 0.92 mg/dL (ref 0.44–1.00)
GFR, Estimated: >60 mL/min (ref >60)
Glucose, Bld: 98 mg/dL (ref 70–99)
Potassium: 3.9 mmol/L (ref 3.5–5.1)
Sodium: 140 mmol/L (ref 135–145)
Total Bilirubin: 1 mg/dL (ref 0.3–1.2)
Total Protein: 7.3 g/dL (ref 6.5–8.1)

## 2021-11-14 LAB — CBC WITH DIFFERENTIAL/PLATELET
Abs Immature Granulocytes: 0.01 10*3/uL (ref 0.00–0.07)
Basophils Absolute: 0 10*3/uL (ref 0.0–0.1)
Basophils Relative: 1 %
Eosinophils Absolute: 0.1 10*3/uL (ref 0.0–0.5)
Eosinophils Relative: 2 %
HCT: 39.5 % (ref 36.0–46.0)
Hemoglobin: 13 g/dL (ref 12.0–15.0)
Immature Granulocytes: 0 %
Lymphocytes Relative: 53 %
Lymphs Abs: 2.7 10*3/uL (ref 0.7–4.0)
MCH: 31.1 pg (ref 26.0–34.0)
MCHC: 32.9 g/dL (ref 30.0–36.0)
MCV: 94.5 fL (ref 80.0–100.0)
Monocytes Absolute: 0.5 10*3/uL (ref 0.1–1.0)
Monocytes Relative: 9 %
Neutro Abs: 1.8 10*3/uL (ref 1.7–7.7)
Neutrophils Relative %: 35 %
Platelets: 321 10*3/uL (ref 150–400)
RBC: 4.18 MIL/uL (ref 3.87–5.11)
RDW: 11.9 % (ref 11.5–15.5)
WBC: 5.1 10*3/uL (ref 4.0–10.5)
nRBC: 0 % (ref 0.0–0.2)

## 2021-11-14 MED ORDER — DOXYCYCLINE HYCLATE 100 MG PO CAPS: 100.0000 mg | ORAL_CAPSULE | Freq: Two times a day (BID) | ORAL | 0 refills | Status: DC

## 2021-11-14 NOTE — ED Provider Notes (Signed)
[facility] EMERGENCY DEPT Provider Note   CSN: [id number] Arrival date & time: 11/14/21  1611     History  Chief Complaint  Patient presents with   Tick Removal    Dana Knight is a 46 y.o. female.  Patient presents to the hospital complaining of pain in the left upper leg, lower abdominal pain, headache, since having a tick bite approximately 1 week ago in her lower back.  She noticed it after showering and removed it.  She states that over the past few days she has developed symptoms as noted above.  No rashes noted.  She does have nausea without vomiting.  Denies chest pain or shortness of breath.  Past medical history significant for anxiety, bipolar affective disorder.  HPI     Home Medications Prior to Admission medications   Medication Sig Start Date End Date Taking? Authorizing Provider  doxycycline (VIBRAMYCIN) 100 MG capsule Take 1 capsule (100 mg total) by mouth 2 (two) times daily. 11/14/21  Yes Darrick Grinder, PA-C  albuterol (VENTOLIN HFA) 108 (90 Base) MCG/ACT inhaler Inhale 2 puffs into the lungs every 4 (four) hours as needed for wheezing or shortness of breath. 09/01/21   Dana Allan, MD  cetirizine (ZYRTEC) 10 MG tablet Take 1 tablet (10 mg total) by mouth daily. 09/01/21   Dana Allan, MD  fluticasone (FLONASE) 50 MCG/ACT nasal spray Place 2 sprays into both nostrils daily. 07/02/21   Dana Allan, MD  hydrocortisone 1 % ointment Apply 1 application topically 2 (two) times daily. 07/02/21   Dana Allan, MD  levonorgestrel (MIRENA) 20 MCG/24HR IUD 1 each by Intrauterine route once.    [provider]  methocarbamol (ROBAXIN) 500 MG tablet Take 2 tablets (1,000 mg total) by mouth 4 (four) times daily. 08/01/21   Renne Crigler, PA-C  QUEtiapine (SEROQUEL) 50 MG tablet TAKE 1.5 TABLETS BY MOUTH AT BEDTIME. 09/01/21   Dana Allan, MD      Allergies    Diflucan [fluconazole]    Review of Systems   Review of Systems  Constitutional:   Negative for fever.  Respiratory:  Negative for shortness of breath.   Cardiovascular:  Negative for chest pain.  Gastrointestinal:  Positive for abdominal pain and nausea. Negative for vomiting.  Musculoskeletal:  Positive for myalgias.  Neurological:  Positive for headaches.   Physical Exam Updated Vital Signs BP (!) 134/99   Pulse 71   Temp 98.4 F (36.9 C) (Oral)   Resp 12   Wt 77.6 kg   SpO2 100%   BMI 28.46 kg/m  Physical Exam Vitals and nursing note reviewed.  Constitutional:      General: She is not in acute distress. HENT:     Head: Normocephalic and atraumatic.  Eyes:     Extraocular Movements: Extraocular movements intact.     Conjunctiva/sclera: Conjunctivae normal.  Cardiovascular:     Rate and Rhythm: Normal rate and regular rhythm.     Pulses: Normal pulses.     Heart sounds: Normal heart sounds.  Pulmonary:     Effort: Pulmonary effort is normal.     Breath sounds: Normal breath sounds.  Abdominal:     Palpations: Abdomen is soft.     Tenderness: There is no abdominal tenderness.  Musculoskeletal:        General: No tenderness.     Cervical back: Normal range of motion and neck supple.  Skin:    General: Skin is warm and dry.     Findings:  No rash.  Neurological:     General: No focal deficit present.     Mental Status: She is alert and oriented to person, place, and time.    ED Results / Procedures / Treatments   Labs (all labs ordered are listed, but only abnormal results are displayed) Labs Reviewed  COMPREHENSIVE METABOLIC PANEL - Abnormal; Notable for the following components:      Result Value   AST 14 (*)    Alkaline Phosphatase 37 (*)    All other components within normal limits  CBC WITH DIFFERENTIAL/PLATELET    EKG None  Radiology No results found.  Procedures Procedures    Medications Ordered in ED Medications - No data to display  ED Course/ Medical Decision Making/ A&P                           Medical Decision  Making Amount and/or Complexity of Data Reviewed Labs: ordered.  Risk Prescription drug management.   This patient presents to the ED for concern of tick bite , this involves an extensive number of treatment options, and is a complaint that carries with it a high risk of complications and morbidity.  The differential diagnosis includes [facility] spotted fever, Lyme disease, other processes   Co morbidities that complicate the patient evaluation  None  Lab Tests:  I Ordered, and personally interpreted labs.  The pertinent results include: AST 14, alkaline phosphate 37   Test / Admission - Considered:  The patient has myalgias, headache, abdominal pain.  The symptoms are consistent with early recommended spotted fever.  This diagnosis is fairly rare and the patient does not have rash or other symptoms consistent with a disease at this time.  That being said I am unable to rule it out at this time.  Plan to discharge patient home on course of doxycycline to cover for recommended spotted fever.  If the patient fails to improve she will follow-up as needed.  Final Clinical Impression(s) / ED Diagnoses Final diagnoses:  Tick bite of lower back, initial encounter  Myalgia  Nonintractable headache, unspecified chronicity pattern, unspecified headache type  Lower abdominal pain    Rx / DC Orders ED Discharge Orders          Ordered    doxycycline (VIBRAMYCIN) 100 MG capsule  2 times daily        11/14/21 2056              Pamala Duffel 11/14/21 2104    Terrilee Files, MD 11/15/21 [phone]

## 2021-11-14 NOTE — ED Triage Notes (Signed)
Pt noted that she had a tick bite Saturday and the tick was removed.  Pt is here today as she has been feeling unwell and area surrounding tick bite (lower back) is painful, no bullseye rash noted.  No fever and chills

## 2021-11-14 NOTE — ED Notes (Signed)
ED Provider at bedside. 

## 2021-11-14 NOTE — Discharge Instructions (Signed)
You were seen today for a tick bite with possible complications.  I prescribed doxycycline to cover for potential developing [facility] spotted fever.  At this time your symptoms do not fit the diagnosis completely, but she may be early in the disease process.  Please complete the antibiotic.  Follow-up as needed

## 2021-11-17 NOTE — Progress Notes (Signed)
    SUBJECTIVE:   CHIEF COMPLAINT / HPI: left groin pain  Patient reports that she was bit by a tick 1 week ago.   Recently seen in the ED was started on doxycycline.  She reports that she woke up a couple of days ago with left-sided groin pain that has been unbearable.  Denies any fevers, nausea, vomiting, suprapubic tenderness, urinary symptoms, hematuria, diarrhea, constipation, trauma, increase in activity or repetitive motion, lower back pain, numbness/tingling or weakness of lower extremities.  No incontinence of bowel or bladder.  PERTINENT  PMH / PSH:  Mood disorder  OBJECTIVE:   BP (!) 113/49   Pulse 92   Ht 5\' 5"  (1.651 m)   Wt 172 lb 2 oz (78.1 kg)   SpO2 99%   BMI 28.64 kg/m    General: Alert, no acute distress Cardio: Normal S1 and S2, RRR, no r/m/g Pulm: CTAB, normal work of breathing Abdomen: Bowel sounds normal. Abdomen soft and non-tender. Negative Murphy's sign, negative rebound tenderness, negative cough sign.  Negative CVA tenderness Left groin area is significantly tender to touch to light palpation. No lymphadenopathy appreciated.  Positive FABER's test.  Negative FADIR's test.  Tenderness on palpation of left SI joint  Extremities: No peripheral edema.    ASSESSMENT/PLAN:   Left groin pain Unknown etiology, possible referred pain from SI joint given tenderness appreciated over left SI joint, and positive Faber's test.  Low suspicion for ovarian torsion, ectopic pregnancy, acute abdomen.  No red flags on exam.  -UPT negative, urinalysis negative -Diclofenac gel 4 times daily -Gentle low back and SI joint stretches -Heat/ice as needed -If no improvement in 2 to 3 weeks can consider physical therapy referral.   Tick bite Patient reports tick bite 1 weeks ago.  Evaluated in the ED 4 days ago with symptoms of pain in left upper leg, abdomen, headaches since removal of tick 1 week ago, she was treated with doxycycline at that time. -Continue current  medication as previously prescribed by ED provider.  I suspect that she will not develop Lyme disease given low prevalence of disease in this area, tick was removed, and unknown species of tick.     , MD [facility]

## 2021-11-18 ENCOUNTER — Ambulatory Visit: Payer: 59 | Admitting: Family Medicine

## 2021-11-18 ENCOUNTER — Encounter: Payer: Self-pay | Admitting: Family Medicine

## 2021-11-18 ENCOUNTER — Encounter: Payer: Self-pay | Admitting: *Deleted

## 2021-11-18 VITALS — BP 113/49 | HR 92 | Ht 65.0 in | Wt 172.1 lb

## 2021-11-18 DIAGNOSIS — W57XXXA Bitten or stung by nonvenomous insect and other nonvenomous arthropods, initial encounter: Secondary | ICD-10-CM

## 2021-11-18 DIAGNOSIS — R1032 Left lower quadrant pain: Secondary | ICD-10-CM

## 2021-11-18 DIAGNOSIS — R1031 Right lower quadrant pain: Secondary | ICD-10-CM

## 2021-11-18 LAB — POCT UA - MICROSCOPIC ONLY
Epithelial cells, urine per micros: >20
WBC, Ur, HPF, POC: NONE SEEN (ref 0–5)

## 2021-11-18 LAB — POCT URINALYSIS DIP (CLINITEK)
Bilirubin, UA: NEGATIVE
Glucose, UA: NEGATIVE mg/dL
Ketones, POC UA: NEGATIVE mg/dL
Leukocytes, UA: NEGATIVE
Nitrite, UA: NEGATIVE
POC PROTEIN,UA: NEGATIVE
Spec Grav, UA: 1.025 (ref 1.010–1.025)
Urobilinogen, UA: 1 E.U./dL
pH, UA: 5.5 (ref 5.0–8.0)

## 2021-11-18 LAB — POCT URINE PREGNANCY: Preg Test, Ur: NEGATIVE

## 2021-11-18 NOTE — Patient Instructions (Addendum)
Thank you for coming to see me today. It was a pleasure.   Complete antibiotics as previously prescribed.  Please follow-up with PCP in 2 weeks after completion of antibiotics  Take Ibuprofen for pain as needed Diclofenac gel four times a day as needed If no improvement can refer to Physical therapy.   If you have any questions or concerns, please do not hesitate to call the office at [phone].  Best,   Dana Allan, MD     Sacroiliac Joint Dysfunction  Sacroiliac joint dysfunction is a condition that causes inflammation on one or both sides of the sacroiliac (SI) joint. The SI joint is the joint between two bones of the pelvis called the sacrum and the ilium. The sacrum is the bone at the base of the spine. The ilium is the large bone that forms the hip. This condition causes deep aching or burning pain in the low back. In some cases, the pain may also spread into one or both buttocks, hips, or thighs. What are the causes? This condition may be caused by: Pregnancy. During pregnancy, extra stress is put on the SI joints because the pelvis widens. Injury, such as: Injuries from car crashes. Sports-related injuries. Work-related injuries. Having one leg that is shorter than the other. Conditions that affect the joints, such as: Rheumatoid arthritis. Gout. Psoriatic arthritis. Joint infection (septic arthritis). Sometimes, the cause of SI joint dysfunction is not known. What are the signs or symptoms? Symptoms of this condition include: Aching or burning pain in the lower back. The pain may also spread to other areas, such as: Buttocks. Groin. Thighs. Muscle spasms in or around the painful areas. Increased pain when standing, walking, running, stair climbing, bending, or lifting. How is this diagnosed? This condition is diagnosed with a physical exam and your medical history. During the exam, the health care provider may move one or both of your legs to different  positions to check for pain. Various tests may be done to confirm the diagnosis, including: Imaging tests to look for other causes of pain. These may include: MRI. CT scan. Bone scan. Diagnostic injection. A numbing medicine is injected into the SI joint using a needle. If your pain is temporarily improved or stopped after the injection, this can indicate that SI joint dysfunction is the problem. How is this treated? Treatment depends on the cause and severity of your condition. Treatment options can be noninvasive and may include: Ice or heat applied to the lower back area after an injury. This may help reduce pain and muscle spasms. Medicines to relieve pain or inflammation or to relax the muscles. Wearing a back brace (sacroiliac brace) to help support the joint while your back is healing. Physical therapy to increase muscle strength around the joint and flexibility at the joint. This may also involve learning proper body positions and ways of moving to relieve stress on the joint. Direct manipulation of the SI joint. Use of a device that provides electrical stimulation to help reduce pain at the joint. Other treatments may include: Injections of steroid medicine into the joint to reduce pain and swelling. Radiofrequency ablation. This treatment uses heat to burn away nerves that are carrying pain messages from the joint. Surgery to put in screws and plates that limit or prevent joint motion. This is rare. Follow these instructions at home: Medicines Take over-the-counter and prescription medicines only as told by your health care provider. Ask your health care provider if the medicine prescribed to you:  Requires you to avoid driving or using machinery. Can cause constipation. You may need to take these actions to prevent or treat constipation: Drink enough fluid to keep your urine pale yellow. Take over-the-counter or prescription medicines. Eat foods that are high in fiber, such as  beans, whole grains, and fresh fruits and vegetables. Limit foods that are high in fat and processed sugars, such as fried or sweet foods. If you have a brace: Wear the brace as told by your health care provider. Remove it only as told by your health care provider. Keep the brace clean. If the brace is not waterproof: Do not let it get wet. Cover it with a watertight covering when you take a bath or a shower. Managing pain, stiffness, and swelling     Icing can help with pain and swelling. Heat may help with muscle tension or spasms. Ask your health care provider if you should use ice or heat. If directed, put ice on the affected area: If you have a removable brace, remove it as told by your health care provider. Put ice in a plastic bag. Place a towel between your skin and the bag. Leave the ice on for 20 minutes, 2-3 times a day. Remove the ice if your skin turns bright red. This is very important. If you cannot feel pain, heat, or cold, you have a greater risk of damage to the area. If directed, apply heat to the affected area as often as told by your health care provider. Use the heat source that your health care provider recommends, such as a moist heat pack or a heating pad. Place a towel between your skin and the heat source. Leave the heat on for 20-30 minutes. Remove the heat if your skin turns bright red. This is especially important if you are unable to feel pain, heat, or cold. You may have a greater risk of getting burned. General instructions Rest as needed. Return to your normal activities as told by your health care provider. Ask your health care provider what activities are safe for you. Do exercises as told by your health care provider or physical therapist. Keep all follow-up visits. This is important. Contact a health care provider if: Your pain is not controlled with medicine. You have a fever. Your pain is getting worse. Get help right away if: You have weakness,  numbness, or tingling in your legs or feet. You lose control of your bladder or bowels. Summary Sacroiliac (SI) joint dysfunction is a condition that causes inflammation on one or both sides of the SI joint. This condition causes deep aching or burning pain in the low back. In some cases, the pain may also spread into one or both buttocks, hips, or thighs. Treatment depends on the cause and severity of your condition. It may include medicines to reduce pain and swelling or to relax muscles. This information is not intended to replace advice given to you by your health care provider. Make sure you discuss any questions you have with your health care provider. Document Revised: 10/12/2019 Document Reviewed: 10/12/2019 Elsevier Patient Education  2023 [employer].

## 2021-11-24 ENCOUNTER — Encounter: Payer: Self-pay | Admitting: Family Medicine

## 2021-11-24 DIAGNOSIS — R1032 Left lower quadrant pain: Secondary | ICD-10-CM | POA: Insufficient documentation

## 2021-11-24 DIAGNOSIS — W57XXXA Bitten or stung by nonvenomous insect and other nonvenomous arthropods, initial encounter: Secondary | ICD-10-CM | POA: Insufficient documentation

## 2021-11-24 NOTE — Assessment & Plan Note (Signed)
Patient reports tick bite 1 weeks ago.  Evaluated in the ED 4 days ago with symptoms of pain in left upper leg, abdomen, headaches since removal of tick 1 week ago, she was treated with doxycycline at that time. -Continue current medication as previously prescribed by ED provider.  I suspect that she will not develop Lyme disease given low prevalence of disease in this area, tick was removed, and unknown species of tick.

## 2021-11-24 NOTE — Assessment & Plan Note (Addendum)
Unknown etiology, possible referred pain from SI joint given tenderness appreciated over left SI joint, and positive Faber's test.  Low suspicion for ovarian torsion, ectopic pregnancy, acute abdomen.  No red flags on exam.  -UPT negative, urinalysis negative -Diclofenac gel 4 times daily -Gentle low back and SI joint stretches -Heat/ice as needed -If no improvement in 2 to 3 weeks can consider physical therapy referral.

## 2021-12-04 ENCOUNTER — Other Ambulatory Visit: Payer: Self-pay | Admitting: Family Medicine

## 2021-12-09 ENCOUNTER — Ambulatory Visit: Payer: 59 | Admitting: Family Medicine

## 2021-12-09 NOTE — Progress Notes (Deleted)
    SUBJECTIVE:   CHIEF COMPLAINT / HPI:   ***  PERTINENT  PMH / PSH: ***  OBJECTIVE:   There were no vitals taken for this visit.   General: Alert, no acute distress Cardio: Normal S1 and S2, RRR, no r/m/g Pulm: CTAB, normal work of breathing Abdomen: Bowel sounds normal. Abdomen soft and non-tender.  Extremities: No peripheral edema.  Neuro: Cranial nerves grossly intact   ASSESSMENT/PLAN:   No problem-specific Assessment & Plan notes found for this encounter.     Mory Herrman, MD [facility]   

## 2021-12-10 ENCOUNTER — Other Ambulatory Visit (HOSPITAL_COMMUNITY): Payer: Self-pay

## 2021-12-11 ENCOUNTER — Ambulatory Visit: Payer: 59 | Admitting: Family Medicine

## 2021-12-11 ENCOUNTER — Encounter: Payer: Self-pay | Admitting: Family Medicine

## 2021-12-11 VITALS — BP 120/80 | HR 77 | Ht 65.0 in | Wt 172.0 lb

## 2021-12-11 DIAGNOSIS — Z0000 Encounter for general adult medical examination without abnormal findings: Secondary | ICD-10-CM | POA: Diagnosis not present

## 2021-12-11 DIAGNOSIS — R1084 Generalized abdominal pain: Secondary | ICD-10-CM | POA: Diagnosis not present

## 2021-12-11 NOTE — Patient Instructions (Signed)
Thank you for coming to see me today. It was a pleasure.   Use Miralax daily until you have regular bowel movements Increase water intake Decrease products Increase soluble fiber    For Mental Health Concerns  [facility] [phone] Address: [address] [place], [place] [address] Hours: Open 24/7, No appointment required.      I will be at the [facility] in [place].   The address is [address] [place] The number is [phone]  If you wish to continue your primary care services with me please call the above number to schedule appointment.  Just tell them that you are currently a patient of Dr. Claris Che to schedule an appointment.   Please follow-up with PCP in Sept  If you have any questions or concerns, please do not hesitate to call the office at [phone].  Best,   Dana Allan, MD

## 2021-12-11 NOTE — Progress Notes (Signed)
    SUBJECTIVE:   CHIEF COMPLAINT / HPI: Physical  Patient reports lower abdominal pain.  Has been ongoing for since bitten by tick in May/23.  Patient reports intermittent colicky sharp pain in lower abdomen.  She reports having gone 3 to 4 days without bowel movements.  Feeling bloated.  Denies any nausea, vomiting, fevers, diarrhea or bloody stool.  She is sexually active, has IUD.  Recently had pregnancy test done which was negative.  PERTINENT  PMH / PSH:  Mood disorder  OBJECTIVE:   BP 120/80   Pulse 77   Ht 5\' 5"  (1.651 m)   Wt 172 lb (78 kg)   SpO2 99%   BMI 28.62 kg/m    General: Alert, no acute distress Cardio: Normal S1 and S2, RRR, no r/m/g Pulm: CTAB, normal work of breathing Abdomen: Bowel sounds normal. Abdomen soft and generalized abdominal tenderness.  Negative for rebound tenderness, Murphy sign, negative cough sign. Extremities: No peripheral edema.    ASSESSMENT/PLAN:   Generalized abdominal pain No signs of acute abdomen today.  Given that she has infrequent bowel movements sometimes longer than 4 days and intermittent cramping likely secondary to constipation.  Recent UPT was negative.  Less likely ectopic pregnancy.  Low suspicion for ovarian torsion given intermittent cramping. Will try MiraLAX daily until normal bowel movements.  Increase water intake Increase soluble fiber Follow-up with PCP in 4 to 6 weeks. Strict return precautions provided    Healthcare maintenance Lipid panel today. Up-to-date with all other healthcare maintenance.  , MD [facility]

## 2021-12-12 ENCOUNTER — Encounter: Payer: Self-pay | Admitting: Family Medicine

## 2021-12-12 DIAGNOSIS — R1084 Generalized abdominal pain: Secondary | ICD-10-CM | POA: Insufficient documentation

## 2021-12-12 NOTE — Assessment & Plan Note (Signed)
No signs of acute abdomen today.  Given that she has infrequent bowel movements sometimes longer than 4 days and intermittent cramping likely secondary to constipation.  Recent UPT was negative.  Less likely ectopic pregnancy.  Low suspicion for ovarian torsion given intermittent cramping. Will try MiraLAX daily until normal bowel movements.  Increase water intake Increase soluble fiber Follow-up with PCP in 4 to 6 weeks. Strict return precautions provided

## 2022-02-20 ENCOUNTER — Other Ambulatory Visit: Payer: Self-pay

## 2022-03-19 ENCOUNTER — Other Ambulatory Visit: Payer: Self-pay | Admitting: Family Medicine

## 2022-04-10 ENCOUNTER — Encounter: Payer: Self-pay | Admitting: Family Medicine

## 2022-04-10 ENCOUNTER — Ambulatory Visit: Payer: 59 | Admitting: Family Medicine

## 2022-04-10 VITALS — BP 125/78 | HR 65 | Ht 65.0 in | Wt 176.0 lb

## 2022-04-10 DIAGNOSIS — Z23 Encounter for immunization: Secondary | ICD-10-CM

## 2022-04-10 DIAGNOSIS — Z599 Problem related to housing and economic circumstances, unspecified: Secondary | ICD-10-CM | POA: Diagnosis not present

## 2022-04-10 DIAGNOSIS — F39 Unspecified mood [affective] disorder: Secondary | ICD-10-CM | POA: Diagnosis not present

## 2022-04-10 NOTE — Patient Instructions (Addendum)
You will be able to find a therapist through [email or web address]   Please go to:  [facility] [address]  [place], [place] [address] [phone]   Urgent psychiatry (medication management) Monday-Thursday 8-11AM.   It is highly recommended that you show up at 7/730 because it is first come first serve.    For urgent therapy (not medication) Walk in hours are 8-1pm Monday through Wednesday (please come at 7/730 to ensure you are seen)    Food Resources   SNAP/ Food benefits: [place] [phone]   [place] ;     [place], [place] [facility]) Nutrition program   [employer] [phone];     [place] [phone]    [employer]ping [place]  (Every Thursday 9AM to 11:00AM)   [employer] Day [employer]        [place] Assistance:    [address] [place], [place]   [place]. [place]  [place]   [address] [place], [place] [address]. Please enter through the 2nd door from the street. A Family Market sign will be located next to the entrance.   [email or web address] or [phone]  Must have appointment prior to shopping   and   Housing Resources:   [employer]:   [email or web address]  [place], [place], [place] [address]    [place] Housing Search [email or web address]   [phone]   [place]:  [phone]     [place], [place], [place] [address]      [place]:  [phone]     [place], [place], [place] [address]     Affordable Housing Management:  [phone]  [email or web address]    [address], [place] [address] [place], [place] [address]   Homeless: [place] [facility])  [phone]   [address]     Sal[employer]: [facility] By appointment only - Call [place] ([place])   [place] [employer] [phone]   Pathways  [address] [phone]   [place]  Angels (Home repair program)  [email or web address]    [place].  [place], [place] [address]   [phone]  (For homeowners only)   [employer]  [phone] and older)  [phone] ext [phone]   Resources for Unhoused Persons Coordinated Entry: For Unhoused Persons in [place] Entry if [employer];  Living In [place] (Outside, Musician, [place], Social research officer, government.) Living In A Building With No Customer service manager Living In [place] In A Hotel/Motel Paid For By [employer]  What Is Coordinated Entry? [place]'s homeless service agencies working together to ensure those experiencinghomelessness are quickly connected to services.  Visit [employer] *Access Points Will Not Occur In Event of Inclement Weather or If Space Is Closed to Public  Every Tuesday [facility] [address] [place] [address]  Every Wednesday [facility] [address] [place] [address]  Email Coordinated Entry [email or web address]  Call Our  Coordinated Entry Line [phone]    Possible [place]  [email or web address]  [place], [place] [phone] or [phone] (some housing help available, priority goes to [place] shelter residents with an income)   [place] (assistance available  as funding permits)   [address], [place] or [place] [facility] [phone]    HT DSKAJGO TL XBWI Society [address] [phone]   (only, as funding permits)   [employer] [phone] 8:30-5:00   [facility] [phone] Myna Hidal[place], Th 10:00am-2:00pm   [facility] [phone], extension [phone]  T, Th 10:00am-1:00pm   [facility]), [phone]   [facility]), [address] [phone]

## 2022-04-10 NOTE — Assessment & Plan Note (Signed)
PHQ9 score of 8, GAD 7 score of 19. Patient with possible history of bipolar disorder based on description of manic symptoms previously. Currently on Seroquel. Is under significant amount of stress and at this time do feel that a limited time away from work would provide patient with time to sort through and get support in order to provide for her children and get some things in order to provide stability.  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

## 2022-04-10 NOTE — Progress Notes (Signed)
    SUBJECTIVE:   CHIEF COMPLAINT / HPI:   Anxiety/Depression - Patient with significant anxiety/depression that is affecting her functionality at work - Has already been written up for a big mistake that was made due to her decreased mental concentration and is very scared and worried about making another mistake - Is about to lose her home due to financial issues - Has limited income and is supporting 3 children - Doesn't have a very supportive community around her (mother is negative towards her struggles) - Feels that she needs time off from work due to the overwhelming life situations she is currently in. She cannot pause taking care of her children but needs to get other things accomplished in order to provide for them all.  - Denies any SI/HI but does sometimes feel that she wants to leave and not come back - Possible history of Bipolar, does express some history of manic symptoms - On quetiapine started by Dr. Volanda Napoleon, helps a little bit with her symptoms - Signed up for some therapy sessions via work on Nov 10  - Is interested in therapy and psychiatry for medication   PERTINENT  PMH / PSH: Reviewed  OBJECTIVE:   BP 125/78   Pulse 65   Ht 5\' 5"  (1.651 m)   Wt 176 lb (79.8 kg)   SpO2 100%   BMI 29.29 kg/m   General: overall well-appearing, well-nourished Respiratory: No respiratory distress, breathing comfortably, able to speak in full sentences Skin: warm and dry, no rashes noted on exposed skin Psych: Tearful in the room, appears anxious and overwhelmed  ASSESSMENT/PLAN:   Mood disorder (HCC)  Financial difficulties PHQ9 score of 8, GAD 7 score of 19. Patient with possible history of bipolar disorder based on description of manic symptoms previously. Currently on Seroquel. Is under significant amount of stress and at this time do feel that a limited time away from work would provide patient with time to sort through and get support in order to provide for her children  and get some things in order to provide stability.  - Continue Seroquel  - Psychiatry referral placed - Referenced patient to [email or web address].[email or web address] - Discussed [employer] walk in services and crisis center - Patient to get forms from work for [employer] vs short term disability  - Continue through with therapy through work - Pensions consultant resources for housing and utility assistance       Dana Knight, [place]

## 2022-04-21 ENCOUNTER — Encounter (HOSPITAL_COMMUNITY): Payer: Self-pay | Admitting: Psychiatry

## 2022-04-21 ENCOUNTER — Encounter: Payer: Self-pay | Admitting: Family Medicine

## 2022-04-21 ENCOUNTER — Ambulatory Visit (HOSPITAL_BASED_OUTPATIENT_CLINIC_OR_DEPARTMENT_OTHER): Payer: 59 | Admitting: Psychiatry

## 2022-04-21 ENCOUNTER — Ambulatory Visit (INDEPENDENT_AMBULATORY_CARE_PROVIDER_SITE_OTHER): Payer: 59 | Admitting: Family Medicine

## 2022-04-21 ENCOUNTER — Other Ambulatory Visit: Payer: Self-pay

## 2022-04-21 VITALS — BP 126/68 | HR 85 | Wt 175.6 lb

## 2022-04-21 DIAGNOSIS — F39 Unspecified mood [affective] disorder: Secondary | ICD-10-CM | POA: Diagnosis not present

## 2022-04-21 DIAGNOSIS — F411 Generalized anxiety disorder: Secondary | ICD-10-CM

## 2022-04-21 DIAGNOSIS — F331 Major depressive disorder, recurrent, moderate: Secondary | ICD-10-CM | POA: Diagnosis not present

## 2022-04-21 MED ORDER — SERTRALINE HCL 25 MG PO TABS
ORAL_TABLET | ORAL | 0 refills | Status: DC
Start: 2022-04-21 — End: 2022-05-19

## 2022-04-21 MED ORDER — PROPRANOLOL HCL 10 MG PO TABS: 10.0000 mg | ORAL_TABLET | Freq: Three times a day (TID) | ORAL | 0 refills | Status: DC | PRN

## 2022-04-21 NOTE — Patient Instructions (Signed)
It was great seeing you today!  Im sorry you have not been feeling well. I am glad you have a Psych appointment today as they may need to adjust medication to help you.   Please have your job fax the paperwork to [phone] and I will work on that. I may call you if I have questions while completing it.   Please check-out at the front desk before leaving the clinic. I'd like to see you back in about 2 weeks to check on mood and see about your psych appointment, but if you need to be seen earlier than that for any new issues we're happy to fit you in, just give [place] a call!  Feel free to call with any questions or concerns at any time, at [phone].   Take care,  Dr. [name] [facility]

## 2022-04-21 NOTE — Assessment & Plan Note (Signed)
PHQ9 score of 8 and GAD 7 score of 19. Denies SI/HI. Currently on Seroquel 50mg  at night. Endorses really struggling at work, at home and in all facets of life. Requesting some time off work but can not afford to take a break without pay. Has psychiatry appointment this afternoon. I did not receive FMLA paperwork and advised patient to have job fax it again and I'd be happy to complete it. Encouraged her to continue therapy. Recommended follow up in 2 weeks to check on mood and psych recs. Patient agreeable with plan

## 2022-04-21 NOTE — Progress Notes (Signed)
    SUBJECTIVE:   CHIEF COMPLAINT / HPI:   Patient discusses similar concerns with regards to her mood as last visit and would like FMLA paperwork completed.  Has psych visit today at 4pm  Worried about short term disability because really struggling financially  Has many triggers- mom is biggest trigger but doesn't want to tell her that she doesn't want her around.  Lives with her kids and is trying to be a mom and hold it together but hard to do so Works in the lab and keeps messing up, mentions the big mistake she made Always feels on edge. Needs a mental break  Feels overwhelmed because she does everything on her own Not getting rest, and when its time to talk to a therapist feels drained and doesn't feel like talking   PERTINENT  PMH / PSH: Reviewed   OBJECTIVE:   BP 126/68   Pulse 85   Wt 175 lb 9.6 oz (79.7 kg)   SpO2 100%   BMI 29.22 kg/m    General: alert, NAD CV: RRR no murmurs  Resp: CTAB normal WOB GI: soft, non distended  Psych: mood down and occasionally tearful. Speech normal and non tangential    ASSESSMENT/PLAN:   No problem-specific Assessment & Plan notes found for this encounter.   Mood disorder   PHQ9 score of 8 and GAD 7 score of 19. Denies SI/HI. Currently on Seroquel 50mg  at night. Endorses really struggling at work, at home and in all facets of life. Requesting some time off work but can not afford to take a break without pay. Has psychiatry appointment this afternoon. I did not receive FMLA paperwork and advised patient to have job fax it again and I'd be happy to complete it. Encouraged her to continue therapy. Recommended follow up in 2 weeks to check on mood and psych recs. Patient agreeable with plan   [place]
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
## 2022-04-23 ENCOUNTER — Telehealth: Payer: Self-pay | Admitting: Family Medicine

## 2022-04-23 NOTE — Telephone Encounter (Signed)
Did not see the FLMA paperwork in Dr. Reynolds Bowl box but will route to her to see if she has it.  Glennie Hawk, CMA'

## 2022-04-23 NOTE — Telephone Encounter (Signed)
Patient called asking about the status of her FMLA paperwork. Stated that it was faxed on Tues, wants to make sure we received it. If you can please call her to let her know, [phone]

## 2022-04-27 NOTE — Telephone Encounter (Signed)
Patient is calling to check status of FMLA paperwork. She stated she is needing it done as soon as possible due to not being able to get paid until paperwork is received.   Please advise.   Thanks!

## 2022-04-28 ENCOUNTER — Other Ambulatory Visit (HOSPITAL_COMMUNITY)
Admission: RE | Admit: 2022-04-28 | Discharge: 2022-04-28 | Disposition: A | Discharge status: Home / Self Care | Payer: 59 | Source: Ambulatory Visit | Attending: Family Medicine | Admitting: Family Medicine

## 2022-04-28 ENCOUNTER — Other Ambulatory Visit: Payer: Self-pay

## 2022-04-28 ENCOUNTER — Encounter: Payer: Self-pay | Admitting: Family Medicine

## 2022-04-28 ENCOUNTER — Ambulatory Visit: Payer: 59 | Admitting: Family Medicine

## 2022-04-28 VITALS — BP 122/74 | HR 73 | Wt 171.4 lb

## 2022-04-28 DIAGNOSIS — F331 Major depressive disorder, recurrent, moderate: Secondary | ICD-10-CM

## 2022-04-28 DIAGNOSIS — Z7251 High risk heterosexual behavior: Secondary | ICD-10-CM | POA: Insufficient documentation

## 2022-04-28 NOTE — Patient Instructions (Addendum)
It was great seeing you today!  We completed your fmla paperwork and I faxed that to your job.   We also checked for STIs. I will call if anything is abnormal or will send a mychart message if normal  Please check-out at the front desk before leaving the clinic. Schedule a double slot at the end of January for pap and IUD removal and insertion, but if you need to be seen earlier than that for any new issues we're happy to fit you in, just give [place] a call!  Feel free to call with any questions or concerns at any time, at [phone].   Take care,  Dr. Cora Collum [facility]

## 2022-04-28 NOTE — Assessment & Plan Note (Signed)
PHQ9 score of 8 down from 15 a week ago. No SI/HI. Was seen by psych right after our last visit who continued her on Seroquel 75mg , and was started on Zoloft 25mg  daily and Propranolol 10mg  TID. Completed FMLA paperwork at visit, faxed it and gave a copy to patient. She has psych follow up scheduled next month and fan follow up with me as needed.

## 2022-04-28 NOTE — Progress Notes (Signed)
    SUBJECTIVE:   CHIEF COMPLAINT / HPI:   Patient presents for follow up on mood an completion of FMLA paperwork. Saw psych Dr. Mercy Riding since our last visit 1 week ago who adjusted her medication for her MDD and GAD. States she really likes Dr. Mercy Riding and really likes him. Started on additional medication including Zoloft and propranolol. She has been doing therapy, journaling, working out. States she is trying to do things to keep her busy   Patient also mentioned recent unprotected intercourse and would like to be checked for STIs. Denies any symptoms.   PERTINENT  PMH / PSH: Reviewed   OBJECTIVE:   BP 122/74   Pulse 73   Wt 171 lb 6.4 oz (77.7 kg)   SpO2 100%   BMI 28.52 kg/m    General: alert, NAD CV: RRR no murmurs Resp: CTAB normal WOB GI: soft, non distended Pelvic exam: normal external genitalia, vulva, vagina, cervix with minimal clear discharge Psych: mood down and tearful, speech and thought content normal    ASSESSMENT/PLAN:   Unprotected sexual intercourse Asymptomatic. IUD in place for contraception. Pelvic exam unremarkable.  - f/u STI testing. Declines blood work  - follow up in 2 months for pap and IUD removal and insertion   MDD (major depressive disorder) PHQ9 score of 8 down from 15 a week ago. No SI/HI. Was seen by psych right after our last visit who continued her on Seroquel 75mg , and was started on Zoloft 25mg  daily and Propranolol 10mg  TID. Completed FMLA paperwork at visit, faxed it and gave a copy to patient. She has psych follow up scheduled next month and fan follow up with me as needed.     , DO [facility]

## 2022-04-28 NOTE — Assessment & Plan Note (Signed)
Asymptomatic. IUD in place for contraception. Pelvic exam unremarkable.  - f/u STI testing. Declines blood work  - follow up in 2 months for pap and IUD removal and insertion

## 2022-04-30 LAB — CERVICOVAGINAL ANCILLARY ONLY
Bacterial Vaginitis (gardnerella): POSITIVE — AB
Candida Glabrata: NEGATIVE
Candida Vaginitis: POSITIVE — AB
Chlamydia: NEGATIVE
Comment: NEGATIVE
Comment: NEGATIVE
Comment: NEGATIVE
Comment: NEGATIVE
Comment: NEGATIVE
Comment: NORMAL
Neisseria Gonorrhea: NEGATIVE
Trichomonas: NEGATIVE

## 2022-05-01 ENCOUNTER — Telehealth: Payer: Self-pay | Admitting: Family Medicine

## 2022-05-01 NOTE — Telephone Encounter (Signed)
Reviewed paperwork and placed in PCP's box for completion.  .Sonji Starkes R Raphael Espe, CMA' 

## 2022-05-01 NOTE — Telephone Encounter (Signed)
Patient dropped off FMLA paperwork to be completed. Last DOS was 04/28/22. Placed in [employer].

## 2022-05-11 ENCOUNTER — Telehealth: Payer: Self-pay | Admitting: Family Medicine

## 2022-05-11 NOTE — Telephone Encounter (Signed)
Patient dropped off form at front desk for Disability.  Verified that patient section of form has been completed.  Last DOS/WCC with PCP was 04/28/22.  Placed form in red team folder to be completed by clinical staff.  Dana Knight

## 2022-05-12 ENCOUNTER — Ambulatory Visit: Payer: Self-pay | Admitting: Licensed Clinical Social Worker

## 2022-05-13 NOTE — Telephone Encounter (Signed)
Patient returns call to nurse line regarding Short Term Disability claim. Patient reports that [employer] needs additional information.   Called and spoke with representative. They are needing office notes from 10/31-11/29, estimated return to work date, limitations/restrictions and treatment plan.   Will forward to [facility] for medical records. Disability paperwork placed back in provider box.   Veronda Prude, RN

## 2022-05-14 NOTE — Patient Outreach (Signed)
SW removed self from care team on today. Patient has no additional needs or barriers. Patients understands to contact PCP if additional needs arise.   Konnar Ben, BSW., MSW, LCSW-A  Social Worker [facility] Care Management  [phone]   

## 2022-05-15 ENCOUNTER — Other Ambulatory Visit (HOSPITAL_COMMUNITY): Payer: Self-pay | Admitting: Psychiatry

## 2022-05-15 DIAGNOSIS — F411 Generalized anxiety disorder: Secondary | ICD-10-CM

## 2022-05-19 ENCOUNTER — Telehealth: Payer: Self-pay

## 2022-05-19 ENCOUNTER — Other Ambulatory Visit (HOSPITAL_COMMUNITY): Payer: Self-pay | Admitting: *Deleted

## 2022-05-19 DIAGNOSIS — F411 Generalized anxiety disorder: Secondary | ICD-10-CM

## 2022-05-19 MED ORDER — PROPRANOLOL HCL 10 MG PO TABS: 10.0000 mg | ORAL_TABLET | Freq: Three times a day (TID) | ORAL | 1 refills | Status: DC | PRN

## 2022-05-19 MED ORDER — SERTRALINE HCL 100 MG PO TABS: 100.0000 mg | ORAL_TABLET | Freq: Every day | ORAL | 1 refills | Status: DC

## 2022-05-19 NOTE — Telephone Encounter (Signed)
Patient calls nurse line requesting a prescription for BV.   She reports she was tested on 11/14, however was not having any symptoms.   She reports now thick white discharge with a fishy odor.   Will forward to PCP.   [facility] [address]

## 2022-05-20 ENCOUNTER — Other Ambulatory Visit: Payer: Self-pay | Admitting: Family Medicine

## 2022-05-20 MED ORDER — METRONIDAZOLE 500 MG PO TABS: 500.0000 mg | ORAL_TABLET | Freq: Two times a day (BID) | ORAL | 0 refills | Status: AC

## 2022-05-20 NOTE — Progress Notes (Signed)
Patient reports thick white discharge with fishy odor. 3 weeks ago tested positive for BV but was asymptomatic. Now requests treatment for BV.  Sent in Flagyl BID x 7 days.

## 2022-05-22 ENCOUNTER — Telehealth (HOSPITAL_COMMUNITY): Payer: 59 | Admitting: Psychiatry

## 2022-06-01 ENCOUNTER — Encounter: Payer: Self-pay | Admitting: Family

## 2022-06-12 ENCOUNTER — Other Ambulatory Visit (HOSPITAL_COMMUNITY): Payer: Self-pay | Admitting: Psychiatry

## 2022-06-14 ENCOUNTER — Other Ambulatory Visit (HOSPITAL_COMMUNITY): Payer: Self-pay | Admitting: Psychiatry

## 2022-06-14 DIAGNOSIS — F411 Generalized anxiety disorder: Secondary | ICD-10-CM

## 2022-06-17 ENCOUNTER — Other Ambulatory Visit: Payer: Self-pay | Admitting: Family Medicine

## 2022-06-22 ENCOUNTER — Encounter: Payer: Self-pay | Admitting: Family Medicine

## 2022-06-22 ENCOUNTER — Other Ambulatory Visit (HOSPITAL_COMMUNITY)
Admission: RE | Admit: 2022-06-22 | Discharge: 2022-06-22 | Disposition: A | Discharge status: Home / Self Care | Payer: 59 | Source: Ambulatory Visit | Attending: Family Medicine | Admitting: Family Medicine

## 2022-06-22 ENCOUNTER — Ambulatory Visit (INDEPENDENT_AMBULATORY_CARE_PROVIDER_SITE_OTHER): Payer: 59 | Admitting: Family Medicine

## 2022-06-22 VITALS — BP 138/84 | HR 79 | Temp 98.6°F | Resp 16 | Ht 65.0 in | Wt 168.6 lb

## 2022-06-22 DIAGNOSIS — Z30432 Encounter for removal of intrauterine contraceptive device: Secondary | ICD-10-CM | POA: Diagnosis not present

## 2022-06-22 DIAGNOSIS — Z1231 Encounter for screening mammogram for malignant neoplasm of breast: Secondary | ICD-10-CM

## 2022-06-22 DIAGNOSIS — Z30430 Encounter for insertion of intrauterine contraceptive device: Secondary | ICD-10-CM

## 2022-06-22 DIAGNOSIS — Z124 Encounter for screening for malignant neoplasm of cervix: Secondary | ICD-10-CM

## 2022-06-22 MED ORDER — IBUPROFEN 200 MG PO TABS
600.0000 mg | ORAL_TABLET | Freq: Once | ORAL | Status: AC
  Administered 2022-06-22: 600 mg via ORAL

## 2022-06-22 NOTE — Patient Instructions (Addendum)
It was great seeing you today!  Today we removed your IUD, inserted another and did your pap smear. I will call you if anything is abnormal on your pap or will send a mychart message if normal.   It is possible to have mild cramping and spotting for the next couple of weeks but you should not be in intense pain. If so please come in for evaluation. Post insertion pain may last for a couple of days but should improve over the next couple of weeks.   You can take extra strength Tylenol or ibuprofen for your pain which will also help the pain under your arm which I think is musculoskeletal. I recommend stretching your arm and pec muscles as well. You can also alternate between ice and heat.   For your mammogram you can call the breast center at [phone] to schedule an appointment. An order has been placed.  Please check-out at the front desk before leaving the clinic. I'd like to see you back in about 2 weeks for IUD string check but if you need to be seen earlier than that for any new issues we're happy to fit you in, just give [place] a call!  If you haven't already, sign up for My Chart to have easy access to your labs results, and communication with your primary care physician.  Feel free to call with any questions or concerns at any time, at [phone].   Take care,  Dr. Cora Collum [facility]  Intrauterine Device Insertion An intrauterine device (IUD) is a medical device that is inserted into the uterus to prevent pregnancy. It is a small, T-shaped device that has one or two nylon strings hanging down from it. The strings hang out of the lower part of the uterus (cervix) to allow for future IUD removal. There are two types of IUDs: Hormone IUD. This type of IUD is made of plastic and contains the hormone progestin (synthetic progesterone). A hormone IUD may last 3-5 years, depending on which one you have. Synthetic progesterone prevents pregnancy  by: Thickening cervical mucus to prevent sperm from entering the uterus. Thinning the uterine lining to prevent a fertilized egg from implanting there. Copper IUD. This type of IUD has copper wire wrapped around it. A copper IUD may last up to 10 years. Copper prevents pregnancy by making the uterus and fallopian tubes produce a fluid that kills sperm. Tell a health care provider about: Any allergies you have. All medicines you are taking, including vitamins, herbs, eye drops, creams, and over-the-counter medicines. Any surgeries you have had. Any medical conditions you have, including any sexually transmitted infections (STIs) you may have. Whether you are pregnant or may be pregnant. What are the risks? Generally, this is a safe procedure. However, problems may occur, including: Infection. Bleeding. Allergic reactions to medicines. Puncture (perforation) of the uterus or damage to other structures or organs. Accidental placement of the IUD either in the muscle layer of the uterus (myometrium) or outside the uterus. The IUD falling out of the uterus (expulsion). This is more common among women who have recently had a child. Higher risk of an egg being fertilized outside your uterus (ectopic pregnancy).This is rare. Pelvic inflammatory disease (PID), which is an infection in the uterus and fallopian tubes. The IUD does not cause the infection. The infection is usually from an unknown sexually transmitted infection (STI). This is rare, and it usually happens during the first 20 days after the IUD  is inserted. What happens before the procedure? Ask your health care provider about: Changing or stopping your regular medicines. This is especially important if you are taking diabetes medicines or blood thinners. Taking over-the-counter medicines, vitamins, herbs, and supplements. Talk with your health care provider about when to schedule your IUD placement. Your health care provider may recommend  taking over-the-counter pain medicines before the procedure. These medicines include ibuprofen and naproxen. You may have tests for: Pregnancy. A pregnancy test involves having a urine or blood sample taken. Sexually transmitted infections (STIs). Placing an IUD in someone who has an STI can make the infection worse. Cervical cancer. You may have a Pap test to check for this type of cancer. This means collecting cells from your cervix to be checked under a microscope. You may have a physical exam to determine the size and position of your uterus. What happens during the procedure? A tool (speculum) will be placed in your vagina and widened so that your health care provider can see your cervix. Medicine, or antiseptic, may be applied to your cervix to help lower your risk of infection. You may be given an anesthetic medicine to numb each side of your cervix. This medicine is usually given by an injection into the cervix. A tool called a uterine sound will be inserted into your uterus to check the length of your uterus and the direction that your uterus may be tilted. A slim instrument or tube (IUD inserter) that holds the IUD will be inserted into your vagina, through your cervical canal, and into your uterus. The IUD will be placed in the uterus, and the IUD inserter will be removed. The strings that are attached to the IUD will be trimmed so that they lie just below the cervix. The speculum will be removed. The procedure may vary among health care providers and hospitals. What can I expect after procedure? You may have bleeding after the procedure. This is normal. It varies from light bleeding (spotting) for a few days to menstrual-like bleeding. You may have cramping and pain in the abdomen. You may feel dizzy or light-headed. You may have lower back pain. You may have headaches and nausea. Follow these instructions at home: Before resuming sexual activity, check to make sure that you can  feel the IUD string or strings. You should be able to feel the end of the string below the opening of your cervix. If your IUD string is in place, you may resume sexual activity. If you had a hormonal IUD inserted more than 7 days after your most recent period started, you will need to use a backup method of birth control for 7 days after IUD insertion. Ask your health care provider whether this applies to you. Continue to check that the IUD is still in place by feeling for the strings after every menstrual period, or once a month. An IUD will not protect you from sexually transmitted infections (STIs). Use methods to prevent the exchange of body fluids between partners (barrier protection) every time you have sex. Barrier protection can be used during oral, vaginal, or anal sex. Commonly used barrier methods include: Female condom. Female condom. Dental dam. Take over-the-counter and prescription medicines only as told by your health care provider. Keep all follow-up visits. This is important. Contact a health care provider if: You feel light-headed or weak. You have any of the following problems with your IUD string or strings: The string bothers or hurts you or your sexual partner. You  cannot feel the string. The string has gotten longer. You can feel the IUD in your vagina. You think you may be pregnant, or you miss your menstrual period. You think you may have a sexually transmitted infection (STI). Get help right away if you: You have flu-like symptoms, such as tiredness (fatigue) and muscle aches. You have a fever and chills. You have bleeding that is heavier or lasts longer than a normal menstrual cycle. You have abnormal or bad-smelling discharge from your vagina. You develop abdominal pain that is new, is getting worse, or is not in the same area of earlier cramping and pain. You have pain during sexual activity. Summary An intrauterine device (IUD) is a small, T-shaped device that  has one or two nylon strings hanging down from it. You may have a copper IUD or a hormone IUD. Ask your health care provider what you need to do before the procedure. You may have some tests and you may have to change or stop some medicines. You may have bleeding after the procedure. This is normal. It varies from light spotting for a few days to menstrual-like bleeding. Check to make sure that you can feel the IUD strings before you resume sexual activity. Check the strings after every menstrual period or once a month. An IUD does not protect against STIs. Use other methods to protect yourself against infections. This information is not intended to replace advice given to you by your health care provider. Make sure you discuss any questions you have with your health care provider. Document Revised: 12/13/2019 Document Reviewed: 12/13/2019 Elsevier Patient Education  2023 [employer].

## 2022-06-22 NOTE — Progress Notes (Unsigned)
    SUBJECTIVE:   CHIEF COMPLAINT / HPI:   Patient presents for Pap smear, IUD removal and insertion  IUD placed 5 or 6 years ago. This will be her third time getting an IUD.  Requesting STI testing as well.   Patient also endorses outer left breast tenderness occurring ofr the past few weeks. Denies feeling any lumps or discharge. Denies any trauma to that area.   PERTINENT  PMH / PSH: Reviewed   OBJECTIVE:   BP 138/84   Pulse 79   Temp 98.6 F (37 C)   Resp 16   Ht 5\' 5"  (1.651 m)   Wt 168 lb 9.6 oz (76.5 kg)   SpO2 100%   BMI 28.06 kg/m    Physical exam General: well appearing, NAD Cardiovascular: RRR, no murmurs Lungs: CTAB. Normal WOB Abdomen: soft, non-distended, non-tender Skin: warm, dry. No edema Breasts: L breast appear normal, no suspicious masses, no skin or nipple changes or axillary nodes. There is tenderness to palpation along L midaxillary region  ASSESSMENT/PLAN:   No problem-specific Assessment & Plan notes found for this encounter.   PROCEDURE:   Dana Knight is a 47 y.o. [phone] here for  IUD removal and reinsertion. No GYN concerns.  Pap smear with STI testing completed today.   IUD Removal and Reinsertion  Patient identified, informed consent performed, consent signed.   Discussed risks of irregular bleeding, cramping, infection, malpositioning or misplacement of the IUD outside the uterus which may require further procedures. Also discussed >99% contraception efficacy, increased risk of ectopic pregnancy with failure of method. Time out was performed. Speculum placed in the vagina. The strings of the IUD were grasped and pulled using ring forceps. The IUD was successfully removed in its entirety. The cervix was cleaned with Betadine x 2.  The new  IUD insertion apparatus was used to sound the uterus to 7 cm;  the IUD was then placed per manufacturer's recommendations. Strings trimmed to 3 cm. Tenaculum was removed, good hemostasis noted. Patient  tolerated procedure well. She did have cramping and we gave her 600mg  ibuprofen in the clinic before she left.   Patient was given post-procedure instructions.  Follow up scheduled in 3 weeks for IUD string check.   L breast pain Patient with tenderness to palpation along upper outer quadrant of L breast close to mid axillary region. No suspicious findings on breast exam. Likely musculoskeletal. Discussed supportive care. Also due for mammogram so order placed. Will check on pain at follow up appointment    Dana Knight, [place]

## 2022-06-24 LAB — CYTOLOGY - PAP
Chlamydia: NEGATIVE
Comment: NEGATIVE
Comment: NEGATIVE
Comment: NEGATIVE
Comment: NORMAL
Diagnosis: NEGATIVE
High risk HPV: POSITIVE — AB
Neisseria Gonorrhea: NEGATIVE
Trichomonas: NEGATIVE

## 2022-06-25 ENCOUNTER — Telehealth: Payer: Self-pay | Admitting: Family Medicine

## 2022-06-25 ENCOUNTER — Other Ambulatory Visit: Payer: Self-pay | Admitting: Family Medicine

## 2022-06-25 DIAGNOSIS — N644 Mastodynia: Secondary | ICD-10-CM

## 2022-06-25 NOTE — Telephone Encounter (Signed)
Pt demanding dr Arby Barrette to call her about the matter ASAP. Please advise. Nieve Rojero Kennon Holter, CMA

## 2022-06-25 NOTE — Telephone Encounter (Signed)
Patient called stating that she went for her mammogram this morning but was told that she needed to have orders placed for a diagnostic instead of a screening. She would like to have new orders sent in for a diagnostic please.

## 2022-07-10 ENCOUNTER — Other Ambulatory Visit: Payer: Self-pay | Admitting: Family Medicine

## 2022-07-10 ENCOUNTER — Other Ambulatory Visit (HOSPITAL_COMMUNITY): Payer: Self-pay | Admitting: Psychiatry

## 2022-07-10 DIAGNOSIS — N644 Mastodynia: Secondary | ICD-10-CM

## 2022-07-14 ENCOUNTER — Ambulatory Visit: Payer: Self-pay | Admitting: Family Medicine

## 2022-07-15 ENCOUNTER — Other Ambulatory Visit (HOSPITAL_COMMUNITY): Payer: Self-pay | Admitting: Psychiatry

## 2022-07-15 DIAGNOSIS — F411 Generalized anxiety disorder: Secondary | ICD-10-CM

## 2022-07-16 ENCOUNTER — Telehealth (HOSPITAL_BASED_OUTPATIENT_CLINIC_OR_DEPARTMENT_OTHER): Payer: 59 | Admitting: Psychiatry

## 2022-07-16 ENCOUNTER — Encounter (HOSPITAL_COMMUNITY): Payer: Self-pay | Admitting: Psychiatry

## 2022-07-16 DIAGNOSIS — F331 Major depressive disorder, recurrent, moderate: Secondary | ICD-10-CM

## 2022-07-16 DIAGNOSIS — F411 Generalized anxiety disorder: Secondary | ICD-10-CM

## 2022-07-16 MED ORDER — QUETIAPINE FUMARATE 100 MG PO TABS: 100.0000 mg | ORAL_TABLET | Freq: Every day | ORAL | 1 refills | Status: DC

## 2022-07-16 NOTE — Progress Notes (Signed)
BH MD/PA/NP OP Progress Note  07/16/2022 2:21 PM Dana Knight  MRN:  [id number]  Visit Diagnosis:    ICD-10-CM   1. GAD (generalized anxiety disorder)  F41.1 QUEtiapine (SEROQUEL) 100 MG tablet    2. Moderate episode of recurrent major depressive disorder (HCC)  F33.1 QUEtiapine (SEROQUEL) 100 MG tablet      Assessment: Dana Knight is a 47 y.o. female with a history of anxiety and depression who presented to [facility] for initial evaluation on 04/21/2022.  Per initial evaluation patient reported neurovegetative symptoms of depression including anhedonia, amotivation, increased fatigue, changes in sleep pattern, decreased appetite, and negative thought pattern.  She denied any SI/HI or thoughts of self-harm.  Patient also endorsed symptoms of anxiety including constant worry that she is unable to control, restlessness, irritability, fear that something awful is going to happen, and difficulty relaxing.  Anxiety symptoms can be worse secondary to psychosocial stressors including negative interactions with her mother and work stressors.  Patient denies any history of mania, delusions, paranoia, or auditory or visual hallucinations.  Patient reported having good support in her sister.  Patient met criteria for diagnosis of GAD and MDD.  Dana Knight presents for follow-up evaluation. Today, 07/16/22, patient reports an improvement in her mood and anxiety symptoms over the past few months.  She lists the Seroquel, journaling, and taking time off work as a primary factors behind her improvement.  Patient notes that she did try propranolol for work 1 day however felt nauseous and loopy.  She has since taken it during the day when her mother was visiting with some positive effect and no adverse side effects.  Patient notes that she has not been using the Zoloft consistently and has been using it as needed.  Mechanism of action of this medication was explained and  she was encouraged to start it at 50 mg daily for 7 days before increasing to 100 mg daily.  Risk and benefits were discussed.  Patient will follow up in 2 months.  Plan: - Start Zoloft 50 mg QD for 7 days and then incresa 100 mg QD - Continue propranolol 10 mg TID prn for anxiety - Continue Seroquel 100 mg QHS - CMP, CBC, TSH, UA reviewed - Restart therapy in the future if needed - Crisis resources reviewed - Follow up in a month   Chief Complaint:  Chief Complaint  Patient presents with   Follow-up   HPI: Dana Knight presents reporting that things have been going ok since her last appointment.  She notes around the time of her last appointment she took 4 weeks off work which was helpful and that her improvement in her symptoms.  Since then she has found work more manageable although can still feel overwhelmed on occasion.  On those days she will call and take off of work which is okay with her supervisor.  Patient notes that she had some frustration in relation to the [facility] piece as it has not been figured out yet.  She has been hopeful to use FMLA to take off time intermittently on the days she is feeling overwhelmed.  We had discussed the family today and explained that it does not work on a as needed basis and instead tends to be more of a scheduled time frame that the patient would be out for.  Patient acknowledges and reports that she is okay with just taking the time off as needed right now and does not need  FMLA if he is taken care of.  In regards to medication [name] reports that the Seroquel is the most helpful as it helps her sleep, appetite and mood symptoms.  She had tried the propranolol once before work and it made her feel sick.  Now she only takes it when her mom is coming over and has been helpful in that sense.  With the Zoloft patient was a bit unsure of when she was taking it but believes she may have also been taking when her mom comes over.  We explained that this medication only  works it is built up in the system and encouraged her to take it on a daily basis.  She denies any side effects of the Zoloft so she was encouraged to start on half a tablet 50 mg for 7 days before increasing to 100 mg daily.  We did again go over the side effects of Zoloft, propranolol, and Seroquel.  Patient has finished with her therapy through work and has not felt the need to reconnect at this time.  She notes that she has been working on her coping skills and journaling throughout the days which has been helpful as well.  Past Psychiatric History: Patient reported minimal psychiatric history.  She denies prior psychiatric hospitalizations or past suicide attempts.  She believes that she had been diagnosed with bipolar disorder in the past by her PCP.  Patient has been on Seroquel and the past.  Started on Zoloft and propranolol on 04/21/2022.  Patient denied any substance use.  Past Medical History:  Past Medical History:  Diagnosis Date   Abnormal Pap smear    Anxiety    Bipolar affective disorder, current episode mixed ([place]) 04/06/2018   Contact dermatitis 07/05/2021   Ear pain 07/05/2021   Healthcare maintenance 12/23/2019   History of chlamydia    HSV (herpes simplex virus) anogenital infection    Loss of weight 11/30/2016   Lower abdominal pain 10/03/2014   No pertinent past medical history    Pap smear for cervical cancer screening 06/25/2019   Upper airway cough syndrome 09/03/2021    Past Surgical History:  Procedure Laterality Date   COLPOSCOPY  2010   DILATION AND CURETTAGE OF UTERUS     NO PAST SURGERIES      Family History:  Family History  Problem Relation Age of Onset   Stroke Mother    Diabetes Father    Thyroid disease Maternal Aunt    Diabetes Maternal Grandmother     Social History:  Social History   Socioeconomic History   Marital status: Single    Spouse name: Not on file   Number of children: Not on file   Years of education: Not on file   Highest  education level: Not on file  Occupational History   Not on file  Tobacco Use   Smoking status: Never   Smokeless tobacco: Never  Substance and Sexual Activity   Alcohol use: Yes    Comment: occasional   Drug use: No   Sexual activity: Never    Birth control/protection: I.U.D.  Other Topics Concern   Not on file  Social History Narrative         Social Determinants of Health   Financial Resource Strain: Not on file  Food Insecurity: No Food Insecurity (07/17/2021)   Hunger Vital Sign    Worried About Running Out of Food in the Last Year: Never true    Ran Out of Food in the  Last Year: Never true  Transportation Needs: No Transportation Needs (07/17/2021)   PRAPARE - Hydrologist (Medical): No    Lack of Transportation (Non-Medical): No  Physical Activity: Not on file  Stress: Stress Concern Present (07/17/2021)   [place]    Feeling of Stress : Very much  Social Connections: Not on file    Allergies:  Allergies  Allergen Reactions   Diflucan [Fluconazole] Swelling    Current Medications: Current Outpatient Medications  Medication Sig Dispense Refill   albuterol (VENTOLIN HFA) 108 (90 Base) MCG/ACT inhaler Inhale 2 puffs into the lungs every 4 (four) hours as needed for wheezing or shortness of breath. 8 g 2   cetirizine (ZYRTEC) 10 MG tablet Take 1 tablet (10 mg total) by mouth daily. 30 tablet 11   doxycycline (VIBRAMYCIN) 100 MG capsule Take 1 capsule (100 mg total) by mouth 2 (two) times daily. 20 capsule 0   fluticasone (FLONASE) 50 MCG/ACT nasal spray Place 2 sprays into both nostrils daily. 16 g 6   hydrocortisone 1 % ointment Apply 1 application topically 2 (two) times daily. 30 g 0   levonorgestrel (MIRENA) 20 MCG/24HR IUD 1 each by Intrauterine route once.     methocarbamol (ROBAXIN) 500 MG tablet Take 2 tablets (1,000 mg total) by mouth 4 (four) times daily. 30 tablet  0   propranolol (INDERAL) 10 MG tablet Take 1 tablet (10 mg total) by mouth 3 (three) times daily as needed (anxiety). 90 tablet 1   QUEtiapine (SEROQUEL) 100 MG tablet Take 1 tablet (100 mg total) by mouth at bedtime. 90 tablet 1   sertraline (ZOLOFT) 100 MG tablet TAKE 1 TABLET BY MOUTH EVERY DAY 30 tablet 2   No current facility-administered medications for this visit.     Psychiatric Specialty Exam: Review of Systems  There were no vitals taken for this visit.There is no height or weight on file to calculate BMI.  General Appearance: Well Groomed  Eye Contact:  Good  Speech:  Clear and Coherent  Volume:  Normal  Mood:  Euthymic  Affect:  Congruent  Thought Process:  Coherent and Goal Directed  Orientation:  Full (Time, Place, and Person)  Thought Content: Logical   Suicidal Thoughts:  No  Homicidal Thoughts:  No  Memory:  Immediate;   Good Recent;   Fair  Judgement:  Good  Insight:  Fair  Psychomotor Activity:  Normal  Concentration:  Concentration: Good  Recall:  [place] of Knowledge: Fair  Language: Good  Akathisia:  NA    AIMS (if indicated): not done  Assets:  Communication Skills Desire for Improvement Financial Resources/Insurance Housing Physical Health Transportation Vocational/Educational  ADL's:  Intact  Cognition: WNL  Sleep:  Good   Metabolic Disorder Labs: Lab Results  Component Value Date   HGBA1C 5.6 12/04/2020   No results found for: "PROLACTIN" No results found for: "CHOL", "TRIG", "HDL", "CHOLHDL", "VLDL", "LDLCALC" Lab Results  Component Value Date   TSH 1.600 12/04/2020   TSH 2.080 11/30/2016    Therapeutic Level Labs: No results found for: "LITHIUM" No results found for: "VALPROATE" No results found for: "CBMZ"   Screenings: [place] Office Visit from 04/21/2022 in [place] Office Visit from 04/10/2022 in [place] Office Visit from 07/21/2021 in [place] Office Visit from 05/28/2020 in [place]  Office Visit from 05/20/2020 in [place]  Total GAD-7 Score 19 19 6 7 7       PHQ2-9    [place] Office Visit from 06/22/2022 in [place] Most recent reading at 06/22/2022  3:00 PM Office Visit from 04/28/2022 in [place] Most recent reading at 04/28/2022  9:47 AM Office Visit from 04/21/2022 in [facility] Most recent reading at 04/21/2022  4:39 PM Office Visit from 04/21/2022 in [place] Most recent reading at 04/21/2022  9:47 AM Office Visit from 04/10/2022 in [place] Most recent reading at 04/10/2022 11:17 AM  PHQ-2 Total Score 2 2 4 2 2   PHQ-9 Total Score 8 8 15 8 8       Flowsheet Row ED from 11/14/2021 in [facility] Emergency Department at [facility] ED from 08/13/2021 in [facility] Emergency Department at [facility] ED from 08/01/2021 in [facility] Emergency Department at [place] No Risk No Risk No Risk       Collaboration of Care: Collaboration of Care: Medication Management AEB medication prescription and Primary Care Provider AEB chart review  Patient/Guardian was advised Release of Information must be obtained prior to any record release in order to collaborate their care with an outside provider. Patient/Guardian was advised if they have not already done so to contact the registration department to sign all necessary forms in order for [place] to release information regarding their care.   Consent: Patient/Guardian gives verbal consent for treatment and assignment of benefits for services provided during this visit. Patient/Guardian expressed understanding and agreed to proceed.    Vista Mink, MD 07/16/2022, 2:21 PM   Virtual Visit via Video Note  I connected with Anders Grant on 07/16/22 at  2:00 PM EST by a video enabled telemedicine application and verified that I am speaking with the correct person using two identifiers.  Location: Patient: Home Provider: Home Office   I discussed the limitations of evaluation and management by telemedicine and the availability of in person appointments. The patient expressed understanding and agreed to proceed.   I discussed the assessment and treatment plan with the patient. The patient was provided an opportunity to ask questions and all were answered. The patient agreed with the plan and demonstrated an understanding of the instructions.   The patient was advised to call back or seek an in-person evaluation if the symptoms worsen or if the condition fails to improve as anticipated.  I provided 20 minutes of non-face-to-face time during this encounter.   Vista Mink, MD

## 2022-07-21 ENCOUNTER — Other Ambulatory Visit (HOSPITAL_COMMUNITY)
Admission: RE | Admit: 2022-07-21 | Discharge: 2022-07-21 | Disposition: A | Discharge status: Home / Self Care | Payer: 59 | Source: Ambulatory Visit | Attending: Family Medicine | Admitting: Family Medicine

## 2022-07-21 ENCOUNTER — Encounter: Payer: Self-pay | Admitting: Family Medicine

## 2022-07-21 ENCOUNTER — Ambulatory Visit (INDEPENDENT_AMBULATORY_CARE_PROVIDER_SITE_OTHER): Payer: 59 | Admitting: Family Medicine

## 2022-07-21 VITALS — BP 112/70 | HR 71 | Ht 65.0 in | Wt 172.2 lb

## 2022-07-21 DIAGNOSIS — N898 Other specified noninflammatory disorders of vagina: Secondary | ICD-10-CM

## 2022-07-21 DIAGNOSIS — Z30431 Encounter for routine checking of intrauterine contraceptive device: Secondary | ICD-10-CM

## 2022-07-21 NOTE — Patient Instructions (Addendum)
It was great seeing you today!  You came in to check your IUD which is in place. We also did STD testing. I will call you if anything is abnormal and will send a mychart message if normal.   Feel free to call with any questions or concerns at any time, at [phone].   Take care,  Dr. [name] [facility]

## 2022-07-21 NOTE — Progress Notes (Unsigned)
    SUBJECTIVE:   CHIEF COMPLAINT / HPI:   Patient presents for IUD check.   Also thinks she has BV. Last week started to have an odor and slight discharge. Thin discharge. No itching. No urinary symptoms.  Had a small amount of spotting last week   Had IUD placed 1/8    PERTINENT  PMH / PSH: Reviewed   OBJECTIVE:   BP 112/70   Pulse 71   Ht 5\' 5"  (1.651 m)   Wt 172 lb 3.2 oz (78.1 kg)   SpO2 99%   BMI 28.66 kg/m    Physical exam General: well appearing, NAD Cardiovascular: RRR, no murmurs Lungs: CTAB. Normal WOB Abdomen: soft, non-distended, non-tender Skin: warm, dry. No edema  ASSESSMENT/PLAN:   No problem-specific Assessment & Plan notes found for this encounter.     [place]

## 2022-07-22 LAB — CERVICOVAGINAL ANCILLARY ONLY
Chlamydia: NEGATIVE
Comment: NEGATIVE
Comment: NEGATIVE
Comment: NORMAL
Neisseria Gonorrhea: NEGATIVE
Trichomonas: NEGATIVE

## 2022-07-22 LAB — RPR W/REFLEX TO TREPSURE

## 2022-07-23 LAB — LIPID PANEL
Chol/HDL Ratio: 5.3 ratio — ABNORMAL HIGH (ref 0.0–4.4)
Cholesterol, Total: 301 mg/dL — ABNORMAL HIGH (ref 100–199)
HDL: 57 mg/dL (ref >39)
LDL Chol Calc (NIH): 219 mg/dL — ABNORMAL HIGH (ref 0–99)
Triglycerides: 136 mg/dL (ref 0–149)
VLDL Cholesterol Cal: 25 mg/dL (ref 5–40)

## 2022-07-23 LAB — HIV ANTIBODY (ROUTINE TESTING W REFLEX): HIV Screen 4th Generation wRfx: NONREACTIVE

## 2022-07-23 LAB — T PALLIDUM ANTIBODY, EIA: T pallidum Antibody, EIA: NEGATIVE

## 2022-07-23 LAB — RPR W/REFLEX TO TREPSURE: RPR: NONREACTIVE

## 2022-08-26 ENCOUNTER — Ambulatory Visit
Admission: RE | Admit: 2022-08-26 | Discharge: 2022-08-26 | Disposition: A | Discharge status: Home / Self Care | Payer: 59 | Source: Ambulatory Visit | Attending: Family Medicine | Admitting: Family Medicine

## 2022-08-26 DIAGNOSIS — N644 Mastodynia: Secondary | ICD-10-CM

## 2022-09-15 ENCOUNTER — Encounter (HOSPITAL_COMMUNITY): Payer: Self-pay

## 2022-09-15 ENCOUNTER — Encounter (HOSPITAL_COMMUNITY): Payer: 59 | Admitting: Psychiatry

## 2022-09-15 NOTE — Progress Notes (Signed)
This encounter was created in error - please disregard.

## 2022-12-09 ENCOUNTER — Other Ambulatory Visit: Payer: Self-pay | Admitting: Family Medicine

## 2022-12-09 DIAGNOSIS — R058 Other specified cough: Secondary | ICD-10-CM

## 2022-12-09 NOTE — Telephone Encounter (Signed)
Requesting: CETIRIZINE HCL 10 MG TABLET  Last Visit: 12/11/2021 Next Visit: Visit date not found Last Refill: 09/01/2021  Please Advise

## 2023-01-16 ENCOUNTER — Other Ambulatory Visit (HOSPITAL_COMMUNITY): Payer: Self-pay | Admitting: Psychiatry

## 2023-01-16 DIAGNOSIS — F411 Generalized anxiety disorder: Secondary | ICD-10-CM

## 2023-01-16 DIAGNOSIS — F331 Major depressive disorder, recurrent, moderate: Secondary | ICD-10-CM

## 2023-02-10 ENCOUNTER — Other Ambulatory Visit (HOSPITAL_BASED_OUTPATIENT_CLINIC_OR_DEPARTMENT_OTHER): Payer: Self-pay | Admitting: Emergency Medicine

## 2023-02-11 ENCOUNTER — Other Ambulatory Visit (HOSPITAL_COMMUNITY): Payer: Self-pay | Admitting: Psychiatry

## 2023-02-17 ENCOUNTER — Other Ambulatory Visit (HOSPITAL_BASED_OUTPATIENT_CLINIC_OR_DEPARTMENT_OTHER): Payer: Self-pay

## 2023-04-11 NOTE — Progress Notes (Signed)
    SUBJECTIVE:   CHIEF COMPLAINT / HPI:   Yeast infection  PERTINENT  PMH / PSH: ***  OBJECTIVE:   There were no vitals taken for this visit.  ***  ASSESSMENT/PLAN:   No problem-specific Assessment & Plan notes found for this encounter.     Ivery Quale, MD [facility]

## 2023-04-12 ENCOUNTER — Other Ambulatory Visit (HOSPITAL_COMMUNITY)
Admission: RE | Admit: 2023-04-12 | Discharge: 2023-04-12 | Disposition: A | Discharge status: Home / Self Care | Payer: 59 | Source: Ambulatory Visit | Attending: Family Medicine | Admitting: Family Medicine

## 2023-04-12 ENCOUNTER — Encounter: Payer: Self-pay | Admitting: Family Medicine

## 2023-04-12 ENCOUNTER — Telehealth: Payer: Self-pay | Admitting: Family Medicine

## 2023-04-12 ENCOUNTER — Other Ambulatory Visit: Payer: Self-pay | Admitting: Family Medicine

## 2023-04-12 ENCOUNTER — Ambulatory Visit: Payer: 59 | Admitting: Family Medicine

## 2023-04-12 VITALS — BP 98/64 | HR 68 | Wt 163.0 lb

## 2023-04-12 DIAGNOSIS — N898 Other specified noninflammatory disorders of vagina: Secondary | ICD-10-CM

## 2023-04-12 LAB — POCT WET PREP (WET MOUNT)
Clue Cells Wet Prep Whiff POC: POSITIVE
Trichomonas Wet Prep HPF POC: ABSENT

## 2023-04-12 MED ORDER — METRONIDAZOLE 500 MG PO TABS: 500.0000 mg | ORAL_TABLET | Freq: Two times a day (BID) | ORAL | 0 refills | Status: AC

## 2023-04-12 NOTE — Telephone Encounter (Signed)
Spoke with patient regarding BV lab findings.  Discussed antibiotic treatment with metronidazole.  This prescription has been sent to patient's pharmacy.  Also discussed over-the-counter ointment as needed for yeast infection symptoms.  Patient expressed understanding throughout conversation.

## 2023-04-12 NOTE — Patient Instructions (Addendum)
Thank you for visiting clinic today - it is always our pleasure to care for you.  Today we discussed your recent symptoms and collected samples for yeast and routine STI/STD testing. I will contact you with the results as they come in.   Please reach out to your behavioral provider to schedule an appointment soon for medication management.   Please schedule an appointment with [place] at your earliest convenience to follow up on your back pain.  Reach out any time with any questions or concerns you may have - we are here for you!  Ivery Quale, MD [facility] [phone]

## 2023-04-12 NOTE — Progress Notes (Signed)
Placed order for metronidazole 500 BID x7 days for BV treatment.

## 2023-04-13 LAB — CERVICOVAGINAL ANCILLARY ONLY
Chlamydia: NEGATIVE
Comment: NEGATIVE
Comment: NORMAL
Neisseria Gonorrhea: NEGATIVE

## 2023-04-22 ENCOUNTER — Encounter (HOSPITAL_BASED_OUTPATIENT_CLINIC_OR_DEPARTMENT_OTHER): Payer: Self-pay | Admitting: Emergency Medicine

## 2023-04-22 ENCOUNTER — Other Ambulatory Visit: Payer: Self-pay

## 2023-04-22 ENCOUNTER — Emergency Department (HOSPITAL_BASED_OUTPATIENT_CLINIC_OR_DEPARTMENT_OTHER)
Admission: EM | Admit: 2023-04-22 | Discharge: 2023-04-22 | Disposition: A | Discharge status: Home / Self Care | Payer: 59 | Attending: Emergency Medicine | Admitting: Emergency Medicine

## 2023-04-22 ENCOUNTER — Other Ambulatory Visit (HOSPITAL_BASED_OUTPATIENT_CLINIC_OR_DEPARTMENT_OTHER): Payer: Self-pay

## 2023-04-22 ENCOUNTER — Emergency Department (HOSPITAL_BASED_OUTPATIENT_CLINIC_OR_DEPARTMENT_OTHER): Payer: 59 | Admitting: Radiology

## 2023-04-22 DIAGNOSIS — X58XXXA Exposure to other specified factors, initial encounter: Secondary | ICD-10-CM | POA: Diagnosis not present

## 2023-04-22 DIAGNOSIS — T148XXA Other injury of unspecified body region, initial encounter: Secondary | ICD-10-CM

## 2023-04-22 DIAGNOSIS — Z79899 Other long term (current) drug therapy: Secondary | ICD-10-CM | POA: Insufficient documentation

## 2023-04-22 DIAGNOSIS — S299XXA Unspecified injury of thorax, initial encounter: Secondary | ICD-10-CM | POA: Diagnosis present

## 2023-04-22 DIAGNOSIS — S29011A Strain of muscle and tendon of front wall of thorax, initial encounter: Secondary | ICD-10-CM | POA: Insufficient documentation

## 2023-04-22 LAB — CBC
HCT: 38.2 % (ref 36.0–46.0)
Hemoglobin: 13.2 g/dL (ref 12.0–15.0)
MCH: 32 pg (ref 26.0–34.0)
MCHC: 34.6 g/dL (ref 30.0–36.0)
MCV: 92.5 fL (ref 80.0–100.0)
Platelets: 334 10*3/uL (ref 150–400)
RBC: 4.13 MIL/uL (ref 3.87–5.11)
RDW: 11.9 % (ref 11.5–15.5)
WBC: 3.3 10*3/uL — ABNORMAL LOW (ref 4.0–10.5)
nRBC: 0 % (ref 0.0–0.2)

## 2023-04-22 LAB — TROPONIN I (HIGH SENSITIVITY)
Troponin I (High Sensitivity): 2 ng/L (ref <18)
Troponin I (High Sensitivity): <2 ng/L (ref <18)

## 2023-04-22 LAB — BASIC METABOLIC PANEL
Anion gap: 6 (ref 5–15)
BUN: 10 mg/dL (ref 6–20)
CO2: 28 mmol/L (ref 22–32)
Calcium: 9.9 mg/dL (ref 8.9–10.3)
Chloride: 105 mmol/L (ref 98–111)
Creatinine, Ser: 0.81 mg/dL (ref 0.44–1.00)
GFR, Estimated: >60 mL/min (ref >60)
Glucose, Bld: 91 mg/dL (ref 70–99)
Potassium: 3.8 mmol/L (ref 3.5–5.1)
Sodium: 139 mmol/L (ref 135–145)

## 2023-04-22 LAB — PREGNANCY, URINE: Preg Test, Ur: NEGATIVE

## 2023-04-22 MED ORDER — METHOCARBAMOL 500 MG PO TABS
500.0000 mg | ORAL_TABLET | Freq: Three times a day (TID) | ORAL | 0 refills | Status: AC | PRN
  Filled 2023-04-22: qty 15, 5d supply, fill #0

## 2023-04-22 NOTE — ED Provider Notes (Signed)
[place] EMERGENCY DEPARTMENT AT [facility] Provider Note   CSN: [id number] Arrival date & time: 04/22/23  1059     History  Chief Complaint  Patient presents with   Chest Pain    Dana Knight is a 47 y.o. female bipolar affective disorder, anxiety who presents to ED concerned for intermittent chest pain radiating to the back x2 days. Hurts when they cough, blow nose, bend over to tie shoe, deep inspiration. Patient endorsing URI last week that has since resolved. Tylenol has been providing relief in symptoms.  Denies fever, dyspnea, nausea, vomiting, diarrhea. Denies recent surgery/immobilization, hx DVT/PE, hemoptysis, hx cancer in the past 6 months, calf swelling/tenderness. No tob hx.     Chest Pain      Home Medications Prior to Admission medications   Medication Sig Start Date End Date Taking? Authorizing Provider  methocarbamol (ROBAXIN) 500 MG tablet Take 1 tablet (500 mg total) by mouth every 8 (eight) hours as needed for up to 5 days for muscle spasms. 04/22/23 04/27/23 Yes Valrie Hart F, PA-C  albuterol (VENTOLIN HFA) 108 (90 Base) MCG/ACT inhaler Inhale 2 puffs into the lungs every 4 (four) hours as needed for wheezing or shortness of breath. 09/01/21   Dana Allan, MD  cetirizine (ZYRTEC) 10 MG tablet TAKE 1 TABLET BY MOUTH EVERY DAY 12/09/22   Idalia Needle, [place] J, DO  fluticasone (FLONASE) 50 MCG/ACT nasal spray Place 2 sprays into both nostrils daily. 07/02/21   Dana Allan, MD  hydrocortisone 1 % ointment Apply 1 application topically 2 (two) times daily. 07/02/21   Dana Allan, MD  levonorgestrel (MIRENA) 20 MCG/24HR IUD 1 each by Intrauterine route once.    [provider]  QUEtiapine (SEROQUEL) 100 MG tablet Take 1 tablet (100 mg total) by mouth at bedtime. 07/16/22   Stasia Cavalier, MD  sertraline (ZOLOFT) 100 MG tablet TAKE 1 TABLET BY MOUTH EVERY DAY 06/12/22 09/10/22  Stasia Cavalier, MD      Allergies    Diflucan [fluconazole]     Review of Systems   Review of Systems  Cardiovascular:  Positive for chest pain.    Physical Exam Updated Vital Signs BP 137/85 (BP Location: Right Arm)   Pulse 80   Temp 98.4 F (36.9 C)   Resp 18   Wt 71.7 kg   LMP 04/19/2023   SpO2 100%   BMI 26.29 kg/m  Physical Exam Vitals and nursing note reviewed.  Constitutional:      General: She is not in acute distress.    Appearance: She is not ill-appearing, toxic-appearing or diaphoretic.  HENT:     Head: Normocephalic and atraumatic.     Mouth/Throat:     Mouth: Mucous membranes are moist.     Pharynx: No oropharyngeal exudate or posterior oropharyngeal erythema.  Eyes:     General: No scleral icterus.       Right eye: No discharge.        Left eye: No discharge.     Conjunctiva/sclera: Conjunctivae normal.  Cardiovascular:     Rate and Rhythm: Normal rate and regular rhythm.     Pulses: Normal pulses.          Radial pulses are 2+ on the right side and 2+ on the left side.     Heart sounds: Normal heart sounds. No murmur heard. Pulmonary:     Effort: Pulmonary effort is normal. No respiratory distress.     Breath sounds: Normal breath sounds. No wheezing,  rhonchi or rales.  Abdominal:     General: Bowel sounds are normal.     Palpations: Abdomen is soft.  Musculoskeletal:     Right lower leg: No edema.     Left lower leg: No edema.     Comments: No calf tenderness to palpation. Tenderness to palpation of left Rhomboid Major.  Skin:    General: Skin is warm and dry.     Findings: No rash.  Neurological:     General: No focal deficit present.     Mental Status: She is alert. Mental status is at baseline.  Psychiatric:        Mood and Affect: Mood normal.        Behavior: Behavior normal.     ED Results / Procedures / Treatments   Labs (all labs ordered are listed, but only abnormal results are displayed) Labs Reviewed  CBC - Abnormal; Notable for the following components:      Result Value   WBC 3.3  (*)    All other components within normal limits  BASIC METABOLIC PANEL  PREGNANCY, URINE  TROPONIN I (HIGH SENSITIVITY)  TROPONIN I (HIGH SENSITIVITY)    EKG EKG Interpretation Date/Time:  Thursday April 22 2023 11:07:59 EST Ventricular Rate:  75 PR Interval:  138 QRS Duration:  82 QT Interval:  366 QTC Calculation: 408 R Axis:   53  Text Interpretation: Normal sinus rhythm Low voltage QRS Borderline ECG When compared with ECG of 21-Sep-2020 02:51, PREVIOUS ECG IS PRESENT Confirmed by Fulton Reek [phone]) on 04/22/2023 11:11:59 AM  Radiology DG Chest 2 View  Result Date: 04/22/2023 CLINICAL DATA:  Anterior chest pain with inhalation for 2 days. Getting worse today EXAM: CHEST - 2 VIEW COMPARISON:  X-ray 08/13/2021 FINDINGS: The heart size and mediastinal contours are within normal limits. No consolidation, pneumothorax or effusion. No edema. The visualized skeletal structures are unremarkable. IMPRESSION: No acute cardiopulmonary disease. Electronically Signed   By: Karen Kays M.D.   On: 04/22/2023 13:26    Procedures Procedures    Medications Ordered in ED Medications - No data to display  ED Course/ Medical Decision Making/ A&P                                 Medical Decision Making Amount and/or Complexity of Data Reviewed Labs: ordered. Radiology: ordered.  Risk Prescription drug management.   This patient presents to the ED for concern of chest pain, this involves an extensive number of treatment options, and is a complaint that carries with it a high risk of complications and morbidity.  The differential diagnosis includes acute coronary syndrome, congestive heart failure, pericarditis, pneumonia, pulmonary embolism, tension pneumothorax, aortic dissection, cardiac tamponade, musculoskeletal   Co morbidities that complicate the patient evaluation  none   Additional history obtained:  Dr. Georg Ruddle PCP   Lab Tests:  I Ordered, and personally  interpreted labs.  The pertinent results include:  - Troponin: within normal limits - BMP: no concern for electrolyte abnormality; no concern for kidney damage - CBC: No concern for anemia or leukocytosis   Imaging Studies ordered:  I ordered imaging studies including  -chest xray: to assess for process contributing to symptoms I independently visualized and interpreted imaging  I agree with the radiologist interpretation   Cardiac Monitoring: / EKG:  The patient was maintained on a cardiac monitor.  I personally viewed and interpreted the cardiac monitored which  showed an underlying rhythm of: Sinus rhythm without acute ST changes or arrhythmias   Risk Stratification Score:  - HEART Score: 0 - Wells Score: 0   Problem List / ED Course / Critical interventions / Medication management  Patient presented for intermittent chest pain that radiates to the back.  Patient stating that pain gets worse when she takes a deep breath in, bends over to tie shoe, cough, etc.  Physical exam with tenderness to palpation of left rhomboid major.  Rest of physical exam unremarkable.  Patient afebrile with stable vitals.  Denying any other infectious symptoms today.   Initial troponin within normal limits.  CBC without leukocytosis or anemia.  UPT negative.  BMP reassuring.  Chest x-ray without acute cardiopulmonary disease.  EKG reassuring. It appears the patient's symptoms are MSK related. Shared results with patient.  Offered to prescribe her muscle relaxers which she accepted.  Educated patient to not operate heavy machinery while on muscle relaxers.  Educated patient on alternating ibuprofen and Tylenol for pain management.  Recommend following up with PCP.  Patient verbalized understanding of plan. I have reviewed the patients home medicines and have made adjustments as needed Patient was given return precautions. Patient stable for discharge at this time.  Patient verbalized understanding of  plan.  Ddx:  These are considered less likely due to history of present illness and physical exam findings.  -Acute coronary syndrome: EKG and troponins within normal limits  -Congestive heart failure: patient denies orthopnea, cough, and leg edema -Pericarditis: pain is not positional and patient denies orthopnea and recent illness -Pneumonia: lungs are clear to auscultation bilaterally -Pulmonary embolism: no recent surgeries, blood clot hx, hemoptysis, cancer hx, vitals stable -Pneumothorax: lungs are clear to auscultation bilaterally -Aortic dissection: vital signs are stable, no variation in pulse pressure -Cardiac tamponade: absence of hypotension, JVD, and muffled heart sounds   Social Determinants of Health:  none         Final Clinical Impression(s) / ED Diagnoses Final diagnoses:  Muscle strain    Rx / DC Orders ED Discharge Orders          Ordered    methocarbamol (ROBAXIN) 500 MG tablet  Every 8 hours PRN        04/22/23 1301              Dorthy Cooler, [place] 04/22/23 1342    Laurence Spates, MD 04/22/23 2019

## 2023-04-22 NOTE — ED Triage Notes (Signed)
Pt c/o central CP and back pain, reports difficult to take deep inspiration

## 2023-04-22 NOTE — ED Notes (Signed)
Dc instructions reviewed with patient. Patient voiced understanding. Dc with belongings.  °

## 2023-04-22 NOTE — Discharge Instructions (Addendum)
It was a pleasure caring for you today.  Workup was reassuring.  Please follow-up with your primary care provider.  Seek emergency care if experiencing any new or worsening symptoms.  Alternating between 650 mg Tylenol and 400 mg Advil: The best way to alternate taking Acetaminophen (example Tylenol) and Ibuprofen (example Advil/Motrin) is to take them 3 hours apart. For example, if you take ibuprofen at 6 am you can then take Tylenol at 9 am. You can continue this regimen throughout the day, making sure you do not exceed the recommended maximum dose for each drug.

## 2023-06-17 ENCOUNTER — Other Ambulatory Visit (HOSPITAL_COMMUNITY): Payer: Self-pay | Admitting: Psychiatry

## 2023-06-17 ENCOUNTER — Telehealth (HOSPITAL_COMMUNITY): Payer: Self-pay

## 2023-06-17 DIAGNOSIS — F331 Major depressive disorder, recurrent, moderate: Secondary | ICD-10-CM

## 2023-06-17 DIAGNOSIS — F411 Generalized anxiety disorder: Secondary | ICD-10-CM

## 2023-06-17 MED ORDER — SERTRALINE HCL 50 MG PO TABS: 50.0000 mg | ORAL_TABLET | Freq: Every day | ORAL | 1 refills | Status: DC

## 2023-06-17 MED ORDER — QUETIAPINE FUMARATE 100 MG PO TABS: 100.0000 mg | ORAL_TABLET | Freq: Every day | ORAL | 1 refills | Status: DC

## 2023-06-17 NOTE — Telephone Encounter (Signed)
 Patient called today for a refill on her medication Seroquel  and Zoloft . Patient has not been seen since February and so I told her I would send you a message but that she would need a follow up appointment. Patient verbalized understanding and was transferred to the front desk to schedule. Please review and advise, thank you

## 2023-06-18 ENCOUNTER — Ambulatory Visit: Payer: 59 | Admitting: Family Medicine

## 2023-07-04 IMAGING — DX DG LUMBAR SPINE COMPLETE 4+V
5 series · 5 of 5 positions shown · non-contrast
Comparison: None.

CLINICAL DATA: Lower back pain

EXAM:
LUMBAR SPINE - COMPLETE 4+ VIEW

[l-spine ap]
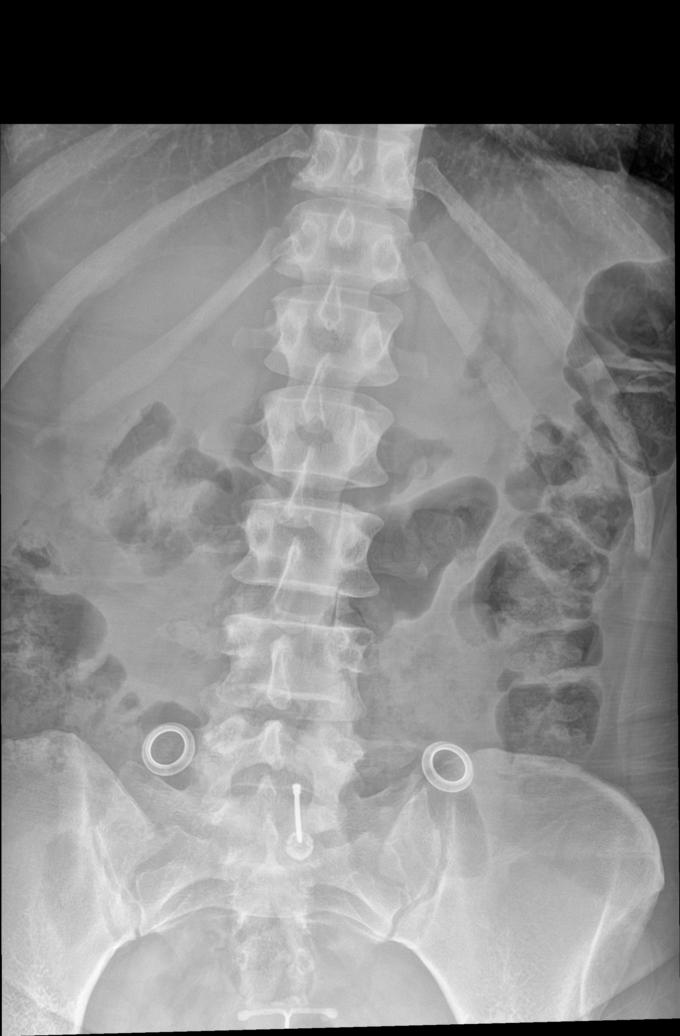

[l-spine obl (1 of 2)]
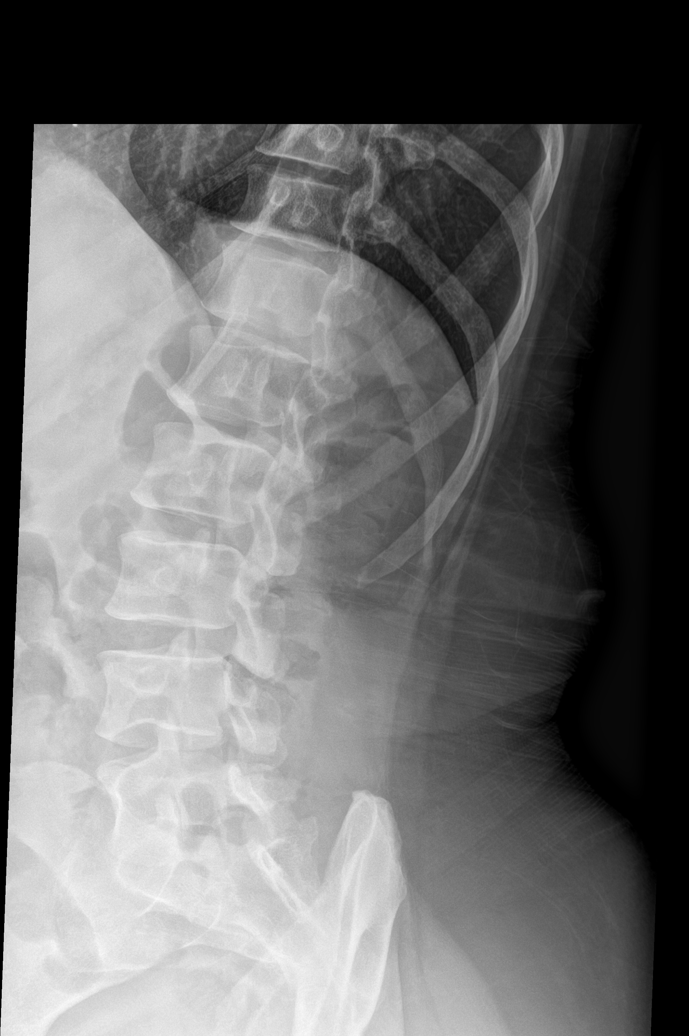

[l-spine obl (2 of 2)]
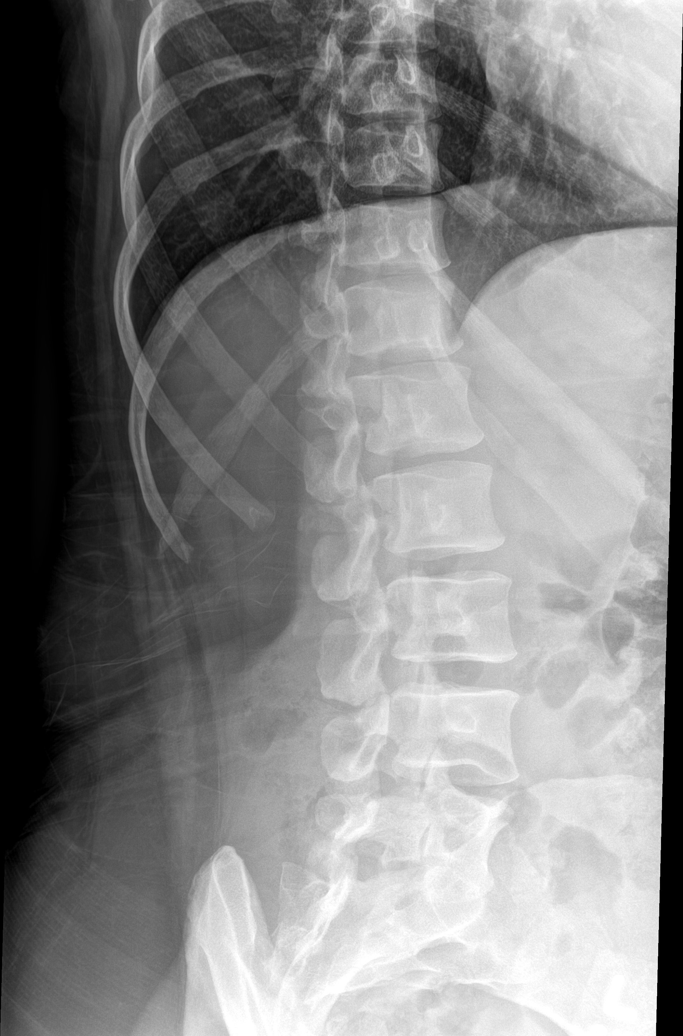

[l-spine lat]
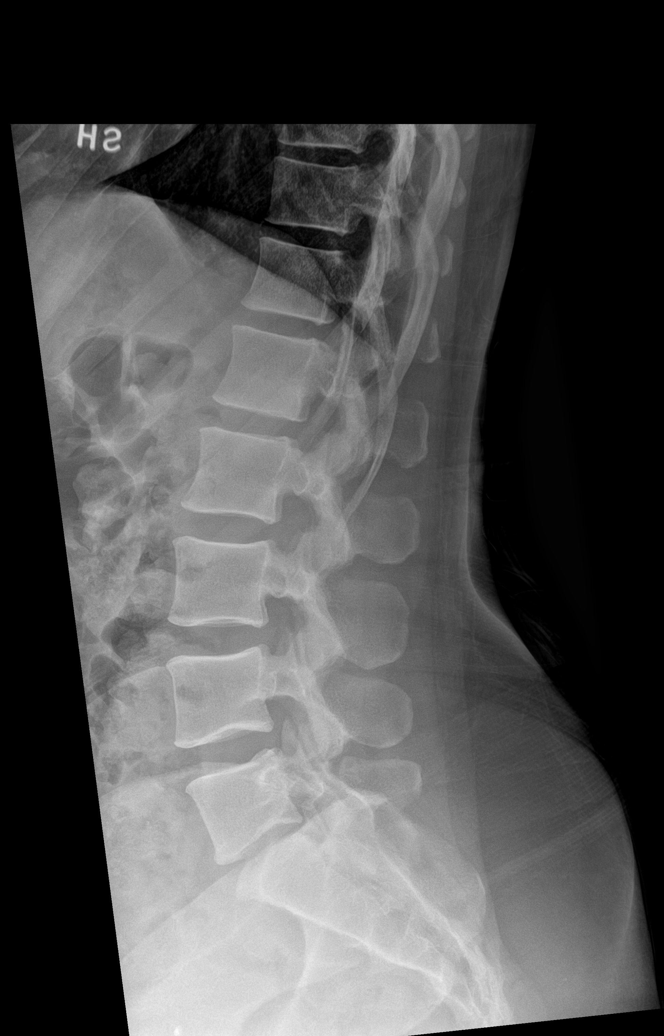

[l-spine spot]
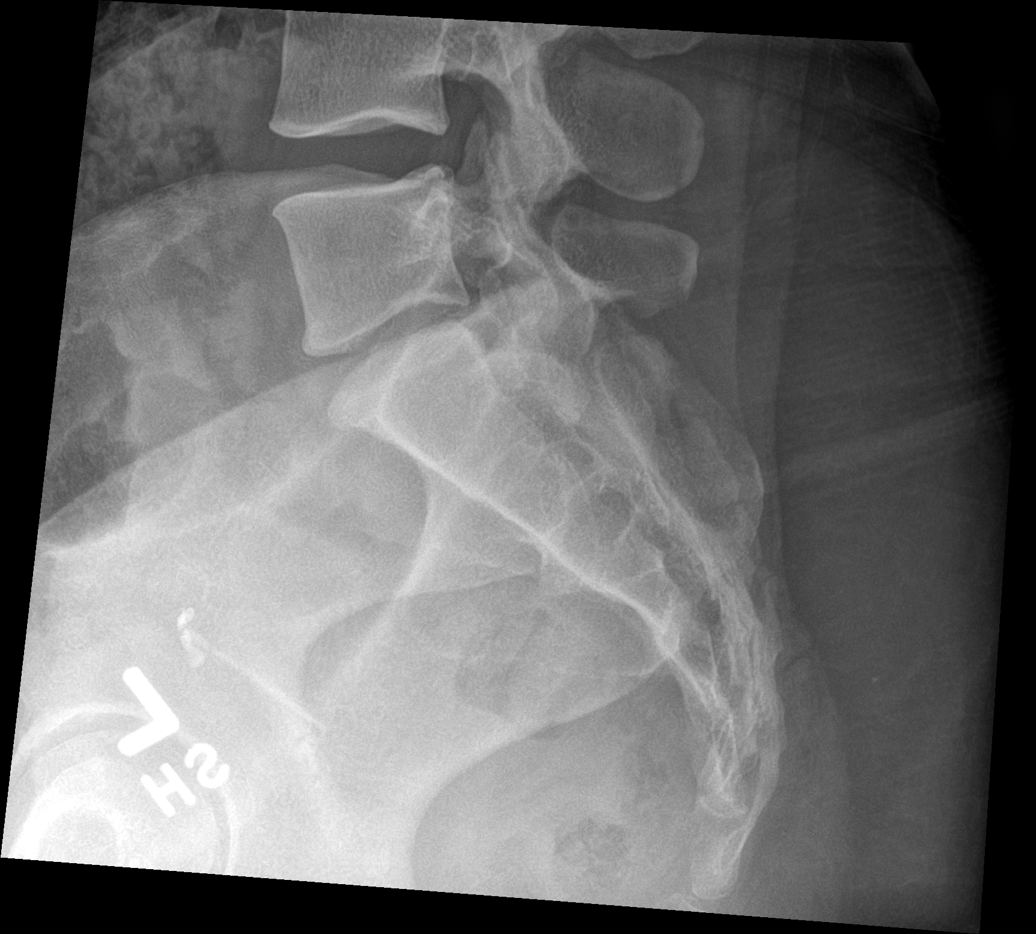

[5 of 5 positions shown; findings below may reference images not displayed]

FINDINGS: There are 5 non-rib-bearing lumbar vertebrae. There is no evidence
of lumbar spine fracture. Normal alignment. Preserved disc heights.
Body piercing noted. IUD overlies the pelvis.
IMPRESSION: Negative lumbar spine radiographs.

## 2023-07-11 ENCOUNTER — Other Ambulatory Visit (HOSPITAL_COMMUNITY): Payer: Self-pay | Admitting: Psychiatry

## 2023-07-11 DIAGNOSIS — F411 Generalized anxiety disorder: Secondary | ICD-10-CM

## 2023-07-11 DIAGNOSIS — F331 Major depressive disorder, recurrent, moderate: Secondary | ICD-10-CM

## 2023-07-21 NOTE — Progress Notes (Signed)
 BH MD/PA/NP OP Progress Note  07/22/2023 3:23 PM Dana Knight  MRN:  [id number]  Visit Diagnosis:    ICD-10-CM   1. GAD (generalized anxiety disorder)  F41.1 mirtazapine  (REMERON ) 7.5 MG tablet    sertraline  (ZOLOFT ) 50 MG tablet    2. Moderate episode of recurrent major depressive disorder (HCC)  F33.1 mirtazapine  (REMERON ) 7.5 MG tablet    sertraline  (ZOLOFT ) 50 MG tablet       Assessment: Dana Knight is a 48 y.o. female with a history of anxiety and depression who presented to [facility] at [facility] for initial evaluation on 04/21/2022.  Per initial evaluation patient reported neurovegetative symptoms of depression including anhedonia, amotivation, increased fatigue, changes in sleep pattern, decreased appetite, and negative thought pattern.  She denied any SI/HI or thoughts of self-harm.  Patient also endorsed symptoms of anxiety including constant worry that she is unable to control, restlessness, irritability, fear that something awful is going to happen, and difficulty relaxing.  Anxiety symptoms can be worse secondary to psychosocial stressors including negative interactions with her mother and work stressors.  Patient denies any history of mania, delusions, paranoia, or auditory or visual hallucinations.  Patient reported having good support in her sister.  Patient met criteria for diagnosis of GAD and MDD.  Dana Knight presents for follow-up evaluation. Today, 07/22/23, patient reports an increase in anxiety over the past year in part related to interpersonal stress at work.  Medications were restarted at lower dosing around a month ago and patient reached out to reschedule appointment.  Since then anxiety symptoms have begun to improve.  She denies any adverse side effects from restarting medications though does not find the Seroquel  as effective for insomnia or appetite symptoms.  In addition since anxiety symptoms still continue we will discontinue  Seroquel  today and start mirtazapine  for better coverage of all 3 symptoms.  Risk and benefits of this medication were reviewed.  We will continue sertraline  at 50 mg daily.  Patient will follow up in 6 weeks.  Plan: - Restarted Zoloft  50 mg QD  - Start mirtazapine  7.5 mg QHS - Discontinue propranolol  10 mg TID prn for anxiety - Discontinue Seroquel  100 mg QHS - CMP, CBC, TSH, UA reviewed - Restart therapy in the future if needed - Crisis resources reviewed - Follow up in 6 weeks   Chief Complaint:  Chief Complaint  Patient presents with   Follow-up   HPI: Dana Knight presents for follow up with last appointment being in February of 2024.  In the interim she started a new job after 18 years at her old job. 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 She had found herself checking out at her old job and the hours had been difficult. The new job had been good for her in the sense that she finally feels like she is getting back to a normal lifestyle with the improvement in schedule.  She also felt that she was sleeping better with the job change.  On the negative side of things she does feel like her anxiety is starting to go up especially due to some interpersonal conflict with her colleagues.  As her anxiety has gone up she finds herself having racing thoughts, decreased appetite, and increased difficulty with insomnia.  Since reaching out a month ago to reschedule her appointment and restarting on bridge medications patient reports the anxiety has started to improve.  She is not feeling as overwhelmed or dreading  work compared to the past.  She has also been able to better navigate the interpersonal conflict with her coworkers.  Of note insomnia and appetite is still are not back to her baseline.  On discussion patient had felt the mirtazapine  was more effective for this than the Seroquel .  That being the case we agreed to discontinue Seroquel  and restart mirtazapine  today.  Risk and  benefits of the mirtazapine  were reviewed.  We also discussed whether restarting propranolol  would be beneficial however Chiara felt that this was not needed.  In the past she had primarily used to when interacting with her mother and has not found that it is necessary at this time.  Past Psychiatric History: Patient reported minimal psychiatric history.  She denies prior psychiatric hospitalizations or past suicide attempts.  She believes that she had been diagnosed with bipolar disorder in the past by her PCP.  Patient has been on Seroquel  and the past.  Started on Zoloft  and propranolol  on 04/21/2022.  Patient denied any substance use.  Past Medical History:  Past Medical History:  Diagnosis Date   Abnormal Pap smear    Anxiety    Bipolar affective disorder, current episode mixed (HCC) 04/06/2018   Contact dermatitis 07/05/2021   Ear pain 07/05/2021   Healthcare maintenance 12/23/2019   History of chlamydia    HSV (herpes simplex virus) anogenital infection    Loss of weight 11/30/2016   Lower abdominal pain 10/03/2014   No pertinent past medical history    Pap smear for cervical cancer screening 06/25/2019   Upper airway cough syndrome 09/03/2021    Past Surgical History:  Procedure Laterality Date   COLPOSCOPY  2010   DILATION AND CURETTAGE OF UTERUS     NO PAST SURGERIES      Family History:  Family History  Problem Relation Age of Onset   Stroke Mother    Diabetes Father    Thyroid  disease Maternal Aunt    Diabetes Maternal Grandmother     Social History:  Social History   Socioeconomic History   Marital status: Single    Spouse name: Not on file   Number of children: Not on file   Years of education: Not on file   Highest education level: Not on file  Occupational History   Not on file  Tobacco Use   Smoking status: Never    Passive exposure: Never   Smokeless tobacco: Never  Substance and Sexual Activity   Alcohol use: Yes    Comment: occasional   Drug  use: No   Sexual activity: Never    Birth control/protection: I.U.D.  Other Topics Concern   Not on file  Social History Narrative         Social Drivers of Health   Financial Resource Strain: Not on file  Food Insecurity: No Food Insecurity (07/17/2021)   Hunger Vital Sign    Worried About Running Out of Food in the Last Year: Never true    Ran Out of Food in the Last Year: Never true  Transportation Needs: No Transportation Needs (07/17/2021)   PRAPARE - Administrator, Civil Service (Medical): No    Lack of Transportation (Non-Medical): No  Physical Activity: Not on file  Stress: Stress Concern Present (07/17/2021)   [employer] of Occupational Health - Occupational Stress Questionnaire    Feeling of Stress : Very much  Social Connections: Not on file    Allergies:  Allergies  Allergen Reactions   Diflucan  [  Fluconazole ] Swelling    Current Medications: Current Outpatient Medications  Medication Sig Dispense Refill   mirtazapine  (REMERON ) 7.5 MG tablet Take 1 tablet (7.5 mg total) by mouth at bedtime. 30 tablet 2   albuterol  (VENTOLIN  HFA) 108 (90 Base) MCG/ACT inhaler Inhale 2 puffs into the lungs every 4 (four) hours as needed for wheezing or shortness of breath. 8 g 2   cetirizine  (ZYRTEC ) 10 MG tablet TAKE 1 TABLET BY MOUTH EVERY DAY 30 tablet 11   fluticasone  (FLONASE ) 50 MCG/ACT nasal spray Place 2 sprays into both nostrils daily. 16 g 6   hydrocortisone  1 % ointment Apply 1 application topically 2 (two) times daily. 30 g 0   levonorgestrel  (MIRENA ) 20 MCG/24HR IUD 1 each by Intrauterine route once.     sertraline  (ZOLOFT ) 50 MG tablet Take 1 tablet (50 mg total) by mouth daily. 30 tablet 1   No current facility-administered medications for this visit.     Psychiatric Specialty Exam: Review of Systems  There were no vitals taken for this visit.There is no height or weight on file to calculate BMI.  General Appearance: Well Groomed  Eye Contact:   Good  Speech:  Clear and Coherent  Volume:  Normal  Mood:  Anxious  Affect:  Congruent  Thought Process:  Coherent and Goal Directed  Orientation:  Full (Time, Place, and Person)  Thought Content: Logical   Suicidal Thoughts:  No  Homicidal Thoughts:  No  Memory:  Immediate;   Good Recent;   Fair  Judgement:  Good  Insight:  Fair  Psychomotor Activity:  Normal  Concentration:  Concentration: Good  Recall:  Fair  Fund of Knowledge: Fair  Language: Good  Akathisia:  NA    AIMS (if indicated): not done  Assets:  Communication Skills Desire for Improvement Financial Resources/Insurance Housing Physical Health Transportation Vocational/Educational  ADL's:  Intact  Cognition: WNL  Sleep:  Good   Metabolic Disorder Labs: Lab Results  Component Value Date   HGBA1C 5.6 12/04/2020   No results found for: PROLACTIN Lab Results  Component Value Date   CHOL 301 (H) 07/21/2022   TRIG 136 07/21/2022   HDL 57 07/21/2022   CHOLHDL 5.3 (H) 07/21/2022   LDLCALC 219 (H) 07/21/2022   Lab Results  Component Value Date   TSH 1.600 12/04/2020   TSH 2.080 11/30/2016    Therapeutic Level Labs: No results found for: LITHIUM No results found for: VALPROATE No results found for: CBMZ   Screenings: GAD-7    Flowsheet Row Office Visit from 04/21/2022 in [facility] - A Dept Of [place]. [facility] Office Visit from 04/10/2022 in [facility] - A Dept Of [place]. [facility] Office Visit from 07/21/2021 in [facility] - A Dept Of [place]. [facility] Office Visit from 05/28/2020 in [facility] - A Dept Of [place]. [facility] Office Visit from 05/20/2020 in [facility] - A Dept Of [place]. [facility]  Total GAD-7 Score 19 19 6 7 7       PHQ2-9    Flowsheet Row Office Visit from 04/12/2023 in [facility] - A Dept Of [place]. [facility] Office Visit from 07/21/2022 in [facility] - A Dept Of [place]. [facility] Office Visit from 06/22/2022 in [facility] - A  Dept Of [place]. [facility] Office Visit from 04/28/2022 in [facility] - A Dept Of [place]. [facility] Office Visit from 04/21/2022 [facility]  PHQ-2 Total Score 2 2 2 2 4   PHQ-9 Total Score 8 9 8 8 15       Flowsheet Row ED from 04/22/2023 in [facility] Emergency Department at [facility] ED from 11/14/2021 in [facility] Emergency Department at [facility] ED from 08/13/2021 in [facility] Emergency Department at [facility]  C-SSRS RISK CATEGORY No Risk No Risk No Risk       Collaboration of Care: Collaboration of Care: Medication Management AEB medication prescription, Primary Care Provider AEB chart review, and Other provider involved in patient's care AEB ED chart review  Patient/Guardian was advised Release of Information must be obtained prior to any record release in order to collaborate their care with an outside provider. Patient/Guardian was advised if they have not already done so to contact the registration department to sign all necessary forms in order for us  to release information regarding their care.   Consent: Patient/Guardian gives verbal consent for treatment and assignment of benefits for services provided during this visit. Patient/Guardian expressed understanding and agreed to proceed.    Arvella CHRISTELLA Finder, MD 07/22/2023, 3:23 PM   Virtual Visit via Video Note  I connected with Sharene Blush on 07/22/23 at  2:30 PM EST by a video enabled telemedicine application and verified that I am speaking with the correct person using two identifiers.  Location: Patient: Home Provider: Home Office   I discussed the limitations of evaluation and management by telemedicine and the availability of in  person appointments. The patient expressed understanding and agreed to proceed.   I discussed the assessment and treatment plan with the patient. The patient was provided an opportunity to ask questions and all were answered. The patient agreed with the plan and demonstrated an understanding of the instructions.   The patient was advised to call back or seek an in-person evaluation if the symptoms worsen or if the condition fails to improve as anticipated.  I provided 20 minutes of non-face-to-face time during this encounter.   Arvella CHRISTELLA Finder, MD

## 2023-07-22 ENCOUNTER — Encounter (HOSPITAL_COMMUNITY): Payer: Self-pay | Admitting: Psychiatry

## 2023-07-22 ENCOUNTER — Telehealth (HOSPITAL_COMMUNITY): Payer: Self-pay | Admitting: Psychiatry

## 2023-07-22 DIAGNOSIS — F331 Major depressive disorder, recurrent, moderate: Secondary | ICD-10-CM

## 2023-07-22 DIAGNOSIS — F411 Generalized anxiety disorder: Secondary | ICD-10-CM

## 2023-07-22 MED ORDER — SERTRALINE HCL 50 MG PO TABS: 50.0000 mg | ORAL_TABLET | Freq: Every day | ORAL | 1 refills | Status: DC

## 2023-07-22 MED ORDER — MIRTAZAPINE 7.5 MG PO TABS: 7.5000 mg | ORAL_TABLET | Freq: Every day | ORAL | 2 refills | Status: DC

## 2023-08-30 ENCOUNTER — Telehealth (HOSPITAL_COMMUNITY): Payer: Self-pay | Admitting: Psychiatry

## 2023-08-30 NOTE — Progress Notes (Unsigned)
 BH MD/PA/NP OP Progress Note  09/02/2023 2:06 PM Dana Knight  MRN:  [id number]  Visit Diagnosis:    ICD-10-CM   1. GAD (generalized anxiety disorder)  F41.1 sertraline (ZOLOFT) 50 MG tablet    mirtazapine (REMERON) 7.5 MG tablet    2. Moderate episode of recurrent major depressive disorder (HCC)  F33.1 sertraline (ZOLOFT) 50 MG tablet    mirtazapine (REMERON) 7.5 MG tablet      Assessment: Dana Knight is a 48 y.o. female with a history of anxiety and depression who presented to [facility] at [facility] for initial evaluation on 04/21/2022.  Per initial evaluation patient reported neurovegetative symptoms of depression including anhedonia, amotivation, increased fatigue, changes in sleep pattern, decreased appetite, and negative thought pattern.  She denied any SI/HI or thoughts of self-harm.  Patient also endorsed symptoms of anxiety including constant worry that she is unable to control, restlessness, irritability, fear that something awful is going to happen, and difficulty relaxing.  Anxiety symptoms can be worse secondary to psychosocial stressors including negative interactions with her mother and work stressors.  Patient denies any history of mania, delusions, paranoia, or auditory or visual hallucinations.  Patient reported having good support in her sister.  Patient met criteria for diagnosis of GAD and MDD.  Dana Knight presents for follow-up evaluation. Today, 09/02/23, patient reports that her mood, anxiety, insomnia, and decreased appetite have all continue to improve with the reinitiation of Zoloft and transition off of Seroquel and onto mirtazapine.  She denies any adverse side effects from the medications.  We will continue on her current regimen and follow up in 6 weeks.  Plan: - Continue Zoloft 50 mg QD  - Continue mirtazapine 7.5 mg QHS - Discontinue propranolol 10 mg TID prn for anxiety - Discontinue Seroquel 100 mg QHS - CMP, CBC, TSH, UA  reviewed - Restart therapy in the future if needed - Crisis resources reviewed - Follow up in 6 weeks  Chief Complaint:  Chief Complaint  Patient presents with   Follow-up   HPI: Dana Knight presents for follow up reporting that the last 6 weeks have gone fairly well for her.  Things at work have improved significantly and she is no longer having the interpersonal concerns with her coworkers.  At home things have also been improving.  With the transition off of Seroquel and onto mirtazapine patient notes that she is sleeping better at night and her appetite has improved.  She denies any notable side effects from the medication.  She is also continued the Zoloft without any adverse effects.  At this point she denies any concerns.  Past Psychiatric History: Patient reported minimal psychiatric history.  She denies prior psychiatric hospitalizations or past suicide attempts.  She believes that she had been diagnosed with bipolar disorder in the past by her PCP.  Patient has been on Seroquel and the past.  Started on Zoloft and propranolol on 04/21/2022.  Patient denied any substance use.  Past Medical History:  Past Medical History:  Diagnosis Date   Abnormal Pap smear    Anxiety    Bipolar affective disorder, current episode mixed (HCC) 04/06/2018   Contact dermatitis 07/05/2021   Ear pain 07/05/2021   Healthcare maintenance 12/23/2019   History of chlamydia    HSV (herpes simplex virus) anogenital infection    Loss of weight 11/30/2016   Lower abdominal pain 10/03/2014   No pertinent past medical history    Pap smear for cervical cancer screening  06/25/2019   Upper airway cough syndrome 09/03/2021    Past Surgical History:  Procedure Laterality Date   COLPOSCOPY  2010   DILATION AND CURETTAGE OF UTERUS     NO PAST SURGERIES      Family History:  Family History  Problem Relation Age of Onset   Stroke Mother    Diabetes Father    Thyroid disease Maternal Aunt    Diabetes Maternal  Grandmother     Social History:  Social History   Socioeconomic History   Marital status: Single    Spouse name: Not on file   Number of children: Not on file   Years of education: Not on file   Highest education level: Not on file  Occupational History   Not on file  Tobacco Use   Smoking status: Never    Passive exposure: Never   Smokeless tobacco: Never  Substance and Sexual Activity   Alcohol use: Yes    Comment: occasional   Drug use: No   Sexual activity: Never    Birth control/protection: I.U.D.  Other Topics Concern   Not on file  Social History Narrative         Social Drivers of Health   Financial Resource Strain: Not on file  Food Insecurity: No Food Insecurity (07/17/2021)   Hunger Vital Sign    Worried About Running Out of Food in the Last Year: Never true    Ran Out of Food in the Last Year: Never true  Transportation Needs: No Transportation Needs (07/17/2021)   PRAPARE - Administrator, Civil Service (Medical): No    Lack of Transportation (Non-Medical): No  Physical Activity: Not on file  Stress: Stress Concern Present (07/17/2021)   [employer] of Occupational Health - Occupational Stress Questionnaire    Feeling of Stress : Very much  Social Connections: Not on file    Allergies:  Allergies  Allergen Reactions   Diflucan [Fluconazole] Swelling    Current Medications: Current Outpatient Medications  Medication Sig Dispense Refill   albuterol (VENTOLIN HFA) 108 (90 Base) MCG/ACT inhaler Inhale 2 puffs into the lungs every 4 (four) hours as needed for wheezing or shortness of breath. 8 g 2   cetirizine (ZYRTEC) 10 MG tablet TAKE 1 TABLET BY MOUTH EVERY DAY 30 tablet 11   fluticasone (FLONASE) 50 MCG/ACT nasal spray Place 2 sprays into both nostrils daily. 16 g 6   hydrocortisone 1 % ointment Apply 1 application topically 2 (two) times daily. 30 g 0   levonorgestrel (MIRENA) 20 MCG/24HR IUD 1 each by Intrauterine route once.      mirtazapine (REMERON) 7.5 MG tablet Take 1 tablet (7.5 mg total) by mouth at bedtime. 30 tablet 2   sertraline (ZOLOFT) 50 MG tablet Take 1 tablet (50 mg total) by mouth daily. 30 tablet 1   No current facility-administered medications for this visit.     Psychiatric Specialty Exam: Review of Systems  There were no vitals taken for this visit.There is no height or weight on file to calculate BMI.  General Appearance: Well Groomed  Eye Contact:  Good  Speech:  Clear and Coherent  Volume:  Normal  Mood:  Euthymic  Affect:  Congruent  Thought Process:  Coherent and Goal Directed  Orientation:  Full (Time, Place, and Person)  Thought Content: Logical   Suicidal Thoughts:  No  Homicidal Thoughts:  No  Memory:  Immediate;   Good Recent;   Fair  Judgement:  Good  Insight:  Fair  Psychomotor Activity:  Normal  Concentration:  Concentration: Good  Recall:  Fair  Fund of Knowledge: Fair  Language: Good  Akathisia:  NA    AIMS (if indicated): not done  Assets:  Communication Skills Desire for Improvement Financial Resources/Insurance Housing Physical Health Transportation Vocational/Educational  ADL's:  Intact  Cognition: WNL  Sleep:  Good   Metabolic Disorder Labs: Lab Results  Component Value Date   HGBA1C 5.6 12/04/2020   No results found for: "PROLACTIN" Lab Results  Component Value Date   CHOL 301 (H) 07/21/2022   TRIG 136 07/21/2022   HDL 57 07/21/2022   CHOLHDL 5.3 (H) 07/21/2022   LDLCALC 219 (H) 07/21/2022   Lab Results  Component Value Date   TSH 1.600 12/04/2020   TSH 2.080 11/30/2016    Therapeutic Level Labs: No results found for: "LITHIUM" No results found for: "VALPROATE" No results found for: "CBMZ"   Screenings: GAD-7    Flowsheet Row Office Visit from 04/21/2022 in [facility] - A Dept Of [place]. [facility] Office Visit from 04/10/2022 in [facility] - A Dept Of [place]. [facility]  Office Visit from 07/21/2021 in [facility] - A Dept Of [place]ide[place]. [facility] Office Visit from 05/28/2020 in [facility] - A Dept Of [place]. [facility] Office Visit from 05/20/2020 in [facility] - A Dept Of [place]. [facility]  Total GAD-7 Score 19 19 6 7 7       PHQ2-9    Flowsheet Row Office Visit from 04/12/2023 in [facility] - A Dept Of [place]. [facility] Office Visit from 07/21/2022 in [facility] - A Dept Of [place]ide[place]. [facility] Office Visit from 06/22/2022 in [facility] - A Dept Of [place]. [facility] Office Visit from 04/28/2022 in [facility] - A Dept Of [place]. [facility] Office Visit from 04/21/2022 in [facility]  PHQ-2 Total Score 2 2 2 2 4   PHQ-9 Total Score 8 9 8 8 15       Flowsheet Row ED from 04/22/2023 in [facility] at [facility] ED from 11/14/2021 in [facility] at [facility] ED from 08/13/2021 in [facility] Emergency Department at [facility]  C-SSRS RISK CATEGORY No Risk No Risk No Risk       Collaboration of Care: Collaboration of Care: Medication Management AEB medication prescription, Primary Care Provider AEB chart review, and Other provider involved in patient's care AEB ED chart review  Patient/Guardian was advised Release of Information must be obtained prior to any record release in order to collaborate their care with an outside provider. Patient/Guardian was advised if they have not already done so to contact the registration department to sign all necessary forms in order for [place] to release information regarding their care.   Consent: Patient/Guardian gives verbal consent for treatment and assignment of benefits for services provided during this visit. Patient/Guardian expressed  understanding and agreed to proceed.    Stasia Cavalier, MD 09/02/2023, 2:06 PM   Virtual Visit via Video Note  I connected with Weldon Inches on 09/02/23 at  2:00 PM EDT by a video enabled telemedicine application and verified that I am speaking with the correct  person using two identifiers.  Location: Patient: Home Provider: Home Office   I discussed the limitations of evaluation and management by telemedicine and the availability of in person appointments. The patient expressed understanding and agreed to proceed.   I discussed the assessment and treatment plan with the patient. The patient was provided an opportunity to ask questions and all were answered. The patient agreed with the plan and demonstrated an understanding of the instructions.   The patient was advised to call back or seek an in-person evaluation if the symptoms worsen or if the condition fails to improve as anticipated.  I provided 20 minutes of non-face-to-face time during this encounter.   Stasia Cavalier, MD

## 2023-09-02 ENCOUNTER — Encounter (HOSPITAL_COMMUNITY): Payer: Self-pay | Admitting: Psychiatry

## 2023-09-02 ENCOUNTER — Telehealth (HOSPITAL_COMMUNITY): Payer: Self-pay | Admitting: Psychiatry

## 2023-09-02 DIAGNOSIS — F411 Generalized anxiety disorder: Secondary | ICD-10-CM | POA: Diagnosis not present

## 2023-09-02 DIAGNOSIS — F331 Major depressive disorder, recurrent, moderate: Secondary | ICD-10-CM | POA: Diagnosis not present

## 2023-09-02 MED ORDER — SERTRALINE HCL 50 MG PO TABS: 50.0000 mg | ORAL_TABLET | Freq: Every day | ORAL | 1 refills | Status: DC

## 2023-09-02 MED ORDER — MIRTAZAPINE 7.5 MG PO TABS: 7.5000 mg | ORAL_TABLET | Freq: Every day | ORAL | 2 refills | Status: DC

## 2023-10-18 NOTE — Progress Notes (Unsigned)
 BH MD/PA/NP OP Progress Note  10/18/2023 3:22 PM NAVID MUSACCHIO  MRN:  [id number]  Visit Diagnosis:  No diagnosis found.   Assessment: Dana Knight is a 48 y.o. female with a history of anxiety and depression who presented to [facility] at [facility] for initial evaluation on 04/21/2022.  Per initial evaluation patient reported neurovegetative symptoms of depression including anhedonia, amotivation, increased fatigue, changes in sleep pattern, decreased appetite, and negative thought pattern.  She denied any SI/HI or thoughts of self-harm.  Patient also endorsed symptoms of anxiety including constant worry that she is unable to control, restlessness, irritability, fear that something awful is going to happen, and difficulty relaxing.  Anxiety symptoms can be worse secondary to psychosocial stressors including negative interactions with her mother and work stressors.  Patient denies any history of mania, delusions, paranoia, or auditory or visual hallucinations.  Patient reported having good support in her sister.  Patient met criteria for diagnosis of GAD and MDD.  Dana Knight presents for follow-up evaluation. Today, 10/18/23, patient reports    that her mood, anxiety, insomnia, and decreased appetite have all continue to improve with the reinitiation of Zoloft  and transition off of Seroquel  and onto mirtazapine .  She denies any adverse side effects from the medications.  We will continue on her current regimen and follow up in 6 weeks.  Plan: - Continue Zoloft  50 mg QD  - Continue mirtazapine  7.5 mg QHS - Discontinue propranolol  10 mg TID prn for anxiety - Discontinue Seroquel  100 mg QHS - CMP, CBC, TSH, UA reviewed - Restart therapy in the future if needed - Crisis resources reviewed - Follow up in 6 weeks  Chief Complaint:  No chief complaint on file.  HPI: Dana Knight presents for follow up reporting that the last 6 weeks    have gone fairly well for her.   Things at work have improved significantly and she is no longer having the interpersonal concerns with her coworkers.  At home things have also been improving.  With the transition off of Seroquel  and onto mirtazapine  patient notes that she is sleeping better at night and her appetite has improved.  She denies any notable side effects from the medication.  She is also continued the Zoloft  without any adverse effects.  At this point she denies any concerns.  Past Psychiatric History: Patient reported minimal psychiatric history.  She denies prior psychiatric hospitalizations or past suicide attempts.  She believes that she had been diagnosed with bipolar disorder in the past by her PCP.  Patient has been on Seroquel  and the past.  Started on Zoloft  and propranolol  on 04/21/2022.  Patient denied any substance use.  Past Medical History:  Past Medical History:  Diagnosis Date   Abnormal Pap smear    Anxiety    Bipolar affective disorder, current episode mixed (HCC) 04/06/2018   Contact dermatitis 07/05/2021   Ear pain 07/05/2021   Healthcare maintenance 12/23/2019   History of chlamydia    HSV (herpes simplex virus) anogenital infection    Loss of weight 11/30/2016   Lower abdominal pain 10/03/2014   No pertinent past medical history    Pap smear for cervical cancer screening 06/25/2019   Upper airway cough syndrome 09/03/2021    Past Surgical History:  Procedure Laterality Date   COLPOSCOPY  2010   DILATION AND CURETTAGE OF UTERUS     NO PAST SURGERIES      Family History:  Family History  Problem Relation Age  of Onset   Stroke Mother    Diabetes Father    Thyroid  disease Maternal Aunt    Diabetes Maternal Grandmother     Social History:  Social History   Socioeconomic History   Marital status: Single    Spouse name: Not on file   Number of children: Not on file   Years of education: Not on file   Highest education level: Not on file  Occupational History   Not on file   Tobacco Use   Smoking status: Never    Passive exposure: Never   Smokeless tobacco: Never  Substance and Sexual Activity   Alcohol use: Yes    Comment: occasional   Drug use: No   Sexual activity: Never    Birth control/protection: I.U.D.  Other Topics Concern   Not on file  Social History Narrative         Social Drivers of Health   Financial Resource Strain: Not on file  Food Insecurity: No Food Insecurity (07/17/2021)   Hunger Vital Sign    Worried About Running Out of Food in the Last Year: Never true    Ran Out of Food in the Last Year: Never true  Transportation Needs: No Transportation Needs (07/17/2021)   PRAPARE - Administrator, Civil Service (Medical): No    Lack of Transportation (Non-Medical): No  Physical Activity: Not on file  Stress: Stress Concern Present (07/17/2021)   [employer] of Occupational Health - Occupational Stress Questionnaire    Feeling of Stress : Very much  Social Connections: Not on file    Allergies:  Allergies  Allergen Reactions   Diflucan  [Fluconazole ] Swelling    Current Medications: Current Outpatient Medications  Medication Sig Dispense Refill   albuterol  (VENTOLIN  HFA) 108 (90 Base) MCG/ACT inhaler Inhale 2 puffs into the lungs every 4 (four) hours as needed for wheezing or shortness of breath. 8 g 2   cetirizine  (ZYRTEC ) 10 MG tablet TAKE 1 TABLET BY MOUTH EVERY DAY 30 tablet 11   fluticasone  (FLONASE ) 50 MCG/ACT nasal spray Place 2 sprays into both nostrils daily. 16 g 6   hydrocortisone  1 % ointment Apply 1 application topically 2 (two) times daily. 30 g 0   levonorgestrel  (MIRENA ) 20 MCG/24HR IUD 1 each by Intrauterine route once.     mirtazapine  (REMERON ) 7.5 MG tablet Take 1 tablet (7.5 mg total) by mouth at bedtime. 30 tablet 2   sertraline  (ZOLOFT ) 50 MG tablet Take 1 tablet (50 mg total) by mouth daily. 30 tablet 1   No current facility-administered medications for this visit.     Psychiatric  Specialty Exam: Review of Systems  There were no vitals taken for this visit.There is no height or weight on file to calculate BMI.  General Appearance: Well Groomed  Eye Contact:  Good  Speech:  Clear and Coherent  Volume:  Normal  Mood:  Euthymic  Affect:  Congruent  Thought Process:  Coherent and Goal Directed  Orientation:  Full (Time, Place, and Person)  Thought Content: Logical   Suicidal Thoughts:  No  Homicidal Thoughts:  No  Memory:  Immediate;   Good Recent;   Fair  Judgement:  Good  Insight:  Fair  Psychomotor Activity:  Normal  Concentration:  Concentration: Good  Recall:  Fair  Fund of Knowledge: Fair  Language: Good  Akathisia:  NA    AIMS (if indicated): not done  Assets:  Communication Skills Desire for Improvement Financial Resources/Insurance Housing Physical Health  Transportation Vocational/Educational  ADL's:  Intact  Cognition: WNL  Sleep:  Good   Metabolic Disorder Labs: Lab Results  Component Value Date   HGBA1C 5.6 12/04/2020   No results found for: "PROLACTIN" Lab Results  Component Value Date   CHOL 301 (H) 07/21/2022   TRIG 136 07/21/2022   HDL 57 07/21/2022   CHOLHDL 5.3 (H) 07/21/2022   LDLCALC 219 (H) 07/21/2022   Lab Results  Component Value Date   TSH 1.600 12/04/2020   TSH 2.080 11/30/2016    Therapeutic Level Labs: No results found for: "LITHIUM" No results found for: "VALPROATE" No results found for: "CBMZ"   Screenings: GAD-7    Flowsheet Row Office Visit from 04/21/2022 in [facility] - A Dept Of [place]. [facility] Office Visit from 04/10/2022 in [facility] - A Dept Of [place]. [facility] Office Visit from 07/21/2021 in [facility] - A Dept Of [place]. [facility] Office Visit from 05/28/2020 in [facility] - A Dept Of [place]. [facility] Office Visit from 05/20/2020 in [facility] - A Dept Of  [place]. [facility]  Total GAD-7 Score 19 19 6 7 7       PHQ2-9    Flowsheet Row Office Visit from 04/12/2023 in [facility] - A Dept Of [place]. [facility] Office Visit from 07/21/2022 in [facility] - A Dept Of [place]. [facility] Office Visit from 06/22/2022 in [facility] - A Dept Of [place]. [facility] Office Visit from 04/28/2022 in [facility] - A Dept Of [place]. [facility] Office Visit from 04/21/2022 in [facility]  PHQ-2 Total Score 2 2 2 2 4   PHQ-9 Total Score 8 9 8 8 15       Flowsheet Row ED from 04/22/2023 in [facility] Emergency Department at [facility] ED from 11/14/2021 in [facility] Emergency Department at [facility] ED from 08/13/2021 in [facility] Emergency Department at [facility]  C-SSRS RISK CATEGORY No Risk No Risk No Risk       Collaboration of Care: Collaboration of Care: Medication Management AEB medication prescription, Primary Care Provider AEB chart review, and Other provider involved in patient's care AEB ED chart review  Patient/Guardian was advised Release of Information must be obtained prior to any record release in order to collaborate their care with an outside provider. Patient/Guardian was advised if they have not already done so to contact the registration department to sign all necessary forms in order for us  to release information regarding their care.   Consent: Patient/Guardian gives verbal consent for treatment and assignment of benefits for services provided during this visit. Patient/Guardian expressed understanding and agreed to proceed.    Yves Herb, MD 10/18/2023, 3:22 PM   Virtual Visit via Video Note  I connected with Hannah Lewis on 10/18/23 at  2:00 PM EDT by a video enabled telemedicine application and verified that I am speaking with the correct  person using two identifiers.  Location: Patient: Home Provider: Home Office   I discussed the limitations of evaluation and management by telemedicine and the availability of in person appointments. The patient expressed understanding and agreed to proceed.   I discussed the assessment and treatment plan with the patient.  The patient was provided an opportunity to ask questions and all were answered. The patient agreed with the plan and demonstrated an understanding of the instructions.   The patient was advised to call back or seek an in-person evaluation if the symptoms worsen or if the condition fails to improve as anticipated.  I provided 20 minutes of non-face-to-face time during this encounter.   Yves Herb, MD

## 2023-10-21 ENCOUNTER — Encounter (HOSPITAL_COMMUNITY): Payer: Self-pay | Admitting: Psychiatry

## 2023-10-21 ENCOUNTER — Encounter (HOSPITAL_COMMUNITY): Payer: Self-pay

## 2023-10-21 NOTE — Progress Notes (Signed)
 This encounter was created in error - please disregard.

## 2024-01-22 ENCOUNTER — Other Ambulatory Visit (HOSPITAL_COMMUNITY): Payer: Self-pay | Admitting: Psychiatry

## 2024-01-22 DIAGNOSIS — F331 Major depressive disorder, recurrent, moderate: Secondary | ICD-10-CM

## 2024-01-22 DIAGNOSIS — F411 Generalized anxiety disorder: Secondary | ICD-10-CM

## 2024-01-26 ENCOUNTER — Emergency Department (HOSPITAL_BASED_OUTPATIENT_CLINIC_OR_DEPARTMENT_OTHER): Admitting: Radiology

## 2024-01-26 ENCOUNTER — Emergency Department (HOSPITAL_BASED_OUTPATIENT_CLINIC_OR_DEPARTMENT_OTHER)
Admission: EM | Admit: 2024-01-26 | Discharge: 2024-01-26 | Disposition: A | Discharge status: Home / Self Care | Attending: Emergency Medicine | Admitting: Emergency Medicine

## 2024-01-26 ENCOUNTER — Other Ambulatory Visit (HOSPITAL_BASED_OUTPATIENT_CLINIC_OR_DEPARTMENT_OTHER): Payer: Self-pay

## 2024-01-26 ENCOUNTER — Other Ambulatory Visit: Payer: Self-pay

## 2024-01-26 ENCOUNTER — Encounter (HOSPITAL_BASED_OUTPATIENT_CLINIC_OR_DEPARTMENT_OTHER): Payer: Self-pay

## 2024-01-26 DIAGNOSIS — K2900 Acute gastritis without bleeding: Secondary | ICD-10-CM | POA: Diagnosis not present

## 2024-01-26 DIAGNOSIS — R079 Chest pain, unspecified: Secondary | ICD-10-CM

## 2024-01-26 LAB — CBC
HCT: 39.3 % (ref 36.0–46.0)
Hemoglobin: 13.3 g/dL (ref 12.0–15.0)
MCH: 31.7 pg (ref 26.0–34.0)
MCHC: 33.8 g/dL (ref 30.0–36.0)
MCV: 93.8 fL (ref 80.0–100.0)
Platelets: 332 K/uL (ref 150–400)
RBC: 4.19 MIL/uL (ref 3.87–5.11)
RDW: 12.3 % (ref 11.5–15.5)
WBC: 4.2 K/uL (ref 4.0–10.5)
nRBC: 0 % (ref 0.0–0.2)

## 2024-01-26 LAB — BASIC METABOLIC PANEL WITH GFR
Anion gap: 12 (ref 5–15)
BUN: 7 mg/dL (ref 6–20)
CO2: 25 mmol/L (ref 22–32)
Calcium: 9.5 mg/dL (ref 8.9–10.3)
Chloride: 102 mmol/L (ref 98–111)
Creatinine, Ser: 0.91 mg/dL (ref 0.44–1.00)
GFR, Estimated: >60 mL/min (ref >60)
Glucose, Bld: 106 mg/dL — ABNORMAL HIGH (ref 70–99)
Potassium: 4 mmol/L (ref 3.5–5.1)
Sodium: 139 mmol/L (ref 135–145)

## 2024-01-26 LAB — HEPATIC FUNCTION PANEL
ALT: 17 U/L (ref 0–44)
AST: 21 U/L (ref 15–41)
Albumin: 4.3 g/dL (ref 3.5–5.0)
Alkaline Phosphatase: 47 U/L (ref 38–126)
Bilirubin, Direct: 0.5 mg/dL — ABNORMAL HIGH (ref 0.0–0.2)
Indirect Bilirubin: 1.5 mg/dL — ABNORMAL HIGH (ref 0.3–0.9)
Total Bilirubin: 1.9 mg/dL — ABNORMAL HIGH (ref 0.0–1.2)
Total Protein: 7.1 g/dL (ref 6.5–8.1)

## 2024-01-26 LAB — LIPASE, BLOOD: Lipase: 31 U/L (ref 11–51)

## 2024-01-26 LAB — TROPONIN T, HIGH SENSITIVITY: Troponin T High Sensitivity: <15 ng/L (ref 0–19)

## 2024-01-26 LAB — PREGNANCY, URINE: Preg Test, Ur: NEGATIVE

## 2024-01-26 LAB — D-DIMER, QUANTITATIVE: D-Dimer, Quant: <0.27 ug{FEU}/mL (ref 0.00–0.50)

## 2024-01-26 MED ORDER — ACETAMINOPHEN 500 MG PO TABS
1000.0000 mg | ORAL_TABLET | Freq: Once | ORAL | Status: AC
  Administered 2024-01-26 (×2): 1000 mg via ORAL
  Filled 2024-01-26: qty 2

## 2024-01-26 MED ORDER — ONDANSETRON HCL 4 MG/2ML IJ SOLN
4.0000 mg | Freq: Once | INTRAMUSCULAR | Status: AC
  Administered 2024-01-26 (×2): 4 mg via INTRAVENOUS
  Filled 2024-01-26: qty 2

## 2024-01-26 MED ORDER — LACTATED RINGERS IV BOLUS
1000.0000 mL | Freq: Once | INTRAVENOUS | Status: AC
  Administered 2024-01-26 (×2): 1000 mL via INTRAVENOUS

## 2024-01-26 MED ORDER — ONDANSETRON 4 MG PO TBDP
4.0000 mg | ORAL_TABLET | Freq: Three times a day (TID) | ORAL | 0 refills | Status: AC | PRN
  Filled 2024-01-26: qty 10, 4d supply, fill #0

## 2024-01-26 NOTE — Discharge Instructions (Signed)
 All the blood work today looks normal.  There is no signs of there being a blood clot or heart attack.  Your EKG and your x-ray look good.  Most likely something related to your stomach.  Rest over the next few days.  Make sure you are staying hydrated and avoid any spicy or acidic foods.  You can take a stool softener or MiraLAX as needed if you are still not able to have a bowel movement.  If you start having high fevers, persistent vomiting, pain in 1 area of your stomach that is worsening please return to the emergency room for repeat check

## 2024-01-26 NOTE — ED Provider Notes (Signed)
 [place] EMERGENCY DEPARTMENT AT [facility] Provider Note   CSN: [id number] Arrival date & time: 01/26/24  [id number]     Patient presents with: Shortness of Breath   Dana Knight is a 48 y.o. female.   Patient is a 48 year old female with a history of anxiety who is presenting today with complaint of chest pain, shortness of breath, nausea/vomiting and a headache that started yesterday late afternoon.  Patient reports she was at work and she was walking down a long hill when she started noticing some discomfort in her chest that made her feel like she had to catch her breath.  She described it as across her chest and under her left armpit that is worse with taking a deep breath and is a sharp sensation.  It usually last 1 to 2 minutes and then resolves until it comes back again.  She reports that comes frequently every 10 to 15 minutes.  When she got home she tried to relax but it did not get any better.  She felt like it was a little bit worse after eating as well.  She started getting a bad headache last night and took 2 aspirin and went to bed.  However in the middle the night she woke up and had an episode of emesis.  She reports she was up and down all night because of just feeling weird and uncomfortable.  She denies cough, congestion, fever.  When she got up this morning she still did not feel well but thought she would try to go to work but when she got to work her symptoms just worsened.  She denies any significant abdominal pain.  She does report she does not have bowel movements regularly and was concerned she might be constipated.  No urinary complaints.  No unilateral leg pain or swelling.  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  She denies any tobacco or alcohol use.  No drug use.  Only medication she takes is an anxiety medication but denies a lot of over-the-counter meds including NSAIDs.  Denies family history of blood clots or heart disease.  She  has an IUD and is not having regular menses at this time.  The history is provided by the patient.  Shortness of Breath      Prior to Admission medications   Medication Sig Start Date End Date Taking? Authorizing Provider  ondansetron  (ZOFRAN -ODT) 4 MG disintegrating tablet Take 1 tablet (4 mg total) by mouth every 8 (eight) hours as needed for nausea or vomiting. 01/26/24  Yes Doretha Folks, MD  albuterol  (VENTOLIN  HFA) 108 (90 Base) MCG/ACT inhaler Inhale 2 puffs into the lungs every 4 (four) hours as needed for wheezing or shortness of breath. 09/01/21   Hope Merle, MD  cetirizine  (ZYRTEC ) 10 MG tablet TAKE 1 TABLET BY MOUTH EVERY DAY 12/09/22   Paige, Victoria J, DO  fluticasone  (FLONASE ) 50 MCG/ACT nasal spray Place 2 sprays into both nostrils daily. 07/02/21   Hope Merle, MD  hydrocortisone  1 % ointment Apply 1 application topically 2 (two) times daily. 07/02/21   Hope Merle, MD  levonorgestrel  (MIRENA ) 20 MCG/24HR IUD 1 each by Intrauterine route once.    [provider]  mirtazapine  (REMERON ) 7.5 MG tablet Take 1 tablet (7.5 mg total) by mouth at bedtime. 09/02/23   Carvin Arvella HERO, MD  sertraline  (ZOLOFT ) 50 MG tablet Take 1 tablet (50 mg total) by mouth daily. 09/02/23 12/01/23  Carvin,  Arvella HERO, MD    Allergies: Diflucan  [fluconazole ]    Review of Systems  Respiratory:  Positive for shortness of breath.     Updated Vital Signs BP 139/83   Pulse 68   Temp 98.8 F (37.1 C) (Oral)   Resp 19   Ht 5' 5 (1.651 m)   Wt 84.4 kg   SpO2 99%   BMI 30.95 kg/m   Physical Exam Vitals and nursing note reviewed.  Constitutional:      General: She is not in acute distress.    Appearance: She is well-developed.     Comments: Slightly anxious  HENT:     Head: Normocephalic and atraumatic.  Eyes:     Pupils: Pupils are equal, round, and reactive to light.  Cardiovascular:     Rate and Rhythm: Normal rate and regular rhythm.     Heart sounds: Normal heart sounds. No  murmur heard.    No friction rub.  Pulmonary:     Effort: Pulmonary effort is normal.     Breath sounds: Normal breath sounds. No wheezing or rales.  Abdominal:     General: Bowel sounds are normal. There is no distension.     Palpations: Abdomen is soft.     Tenderness: There is abdominal tenderness in the epigastric area and left upper quadrant. There is no guarding or rebound.     Comments: Mild upper abdominal pain  Musculoskeletal:        General: No tenderness. Normal range of motion.     Cervical back: Normal range of motion and neck supple.     Right lower leg: No edema.     Left lower leg: No edema.     Comments: No edema.  No calf tenderness  Skin:    General: Skin is warm and dry.     Findings: No rash.  Neurological:     Mental Status: She is alert and oriented to person, place, and time. Mental status is at baseline.     Cranial Nerves: No cranial nerve deficit.  Psychiatric:        Behavior: Behavior normal.     (all labs ordered are listed, but only abnormal results are displayed) Labs Reviewed  BASIC METABOLIC PANEL WITH GFR - Abnormal; Notable for the following components:      Result Value   Glucose, Bld 106 (*)    All other components within normal limits  HEPATIC FUNCTION PANEL - Abnormal; Notable for the following components:   Total Bilirubin 1.9 (*)    Bilirubin, Direct 0.5 (*)    Indirect Bilirubin 1.5 (*)    All other components within normal limits  CBC  PREGNANCY, URINE  LIPASE, BLOOD  D-DIMER, QUANTITATIVE  TROPONIN T, HIGH SENSITIVITY  TROPONIN T, HIGH SENSITIVITY    EKG: EKG Interpretation Date/Time:  Wednesday January 26 2024 07:40:41 EDT Ventricular Rate:  81 PR Interval:  141 QRS Duration:  86 QT Interval:  386 QTC Calculation: 448 R Axis:   60  Text Interpretation: Sinus rhythm Low voltage, precordial leads No significant change since last tracing Confirmed by Doretha Folks ([id number]) on 01/26/2024 7:42:34 AM  Radiology: [facility]  Chest 2 View Result Date: 01/26/2024 CLINICAL DATA:  Chest pain. EXAM: CHEST - 2 VIEW COMPARISON:  04/22/2023. FINDINGS: The heart size and mediastinal contours are within normal limits. Both lungs are clear. No pleural effusion or pneumothorax. No acute osseous abnormality. IMPRESSION: No acute cardiopulmonary findings. Electronically Signed   By: Harrietta Marcelino HERO.D.  On: 01/26/2024 08:34     Procedures   Medications Ordered in the ED  lactated ringers  bolus 1,000 mL (1,000 mLs Intravenous New Bag/Given 01/26/24 0841)  ondansetron  (ZOFRAN ) injection 4 mg (4 mg Intravenous Given 01/26/24 0843)  acetaminophen  (TYLENOL ) tablet 1,000 mg (1,000 mg Oral Given 01/26/24 0840)                                    Medical Decision Making Amount and/or Complexity of Data Reviewed External Data Reviewed: notes. Labs: ordered. Decision-making details documented in ED Course. Radiology: ordered and independent interpretation performed. Decision-making details documented in ED Course. ECG/medicine tests: ordered and independent interpretation performed. Decision-making details documented in ED Course.  Risk OTC drugs. Prescription drug management.   Pt presenting today with a complaint that caries a high risk for morbidity and mortality. Here today with complaint of chest pain and shortness of breath.  Concern for ACS, dysrhythmia, anemia, PE although low risk Wells criteria, GI pathology such as gastritis, esophageal spasm, pancreatitis.  No other abdominal findings to suggest appendicitis, perforation, diverticulitis or cholecystitis.  Also patient could be developing viral syndrome however at this time no cough, congestion or fever noted.  Patient is slightly anxious on exam but otherwise well-appearing.  Vital signs are normal.  At this time low suspicion for dissection, pericarditis, myocarditis, pneumothorax.  Patient given IV fluids and antiemetics as well as some Tylenol .  Labs and imaging are  pending.  I independently interpreted patient's EKG which showed no acute findings at this time and unchanged from her EKG in November 24' I dependently interpreted patient's labs and troponin, CBC, pregnancy test are all within normal limits.  Lipase is normal, BMP within normal limits and LFTs are normal except for a total bilirubin of 1.9.  Troponin is negative and that is after having over 12 hours of discomfort.  Do not feel that she needs a second.  I have independently visualized and interpreted pt's images today. Chest x-ray was within normal limits. When I discussed the findings with the patient.  At this time feel that her symptoms is most likely GI related.  Heart score of 1 and do not feel she needs additional cardiac testing at this time.  Findings discussed with the patient.  She will be discharged home with nausea medicine and return precautions.     Final diagnoses:  Acute nonspecific chest pain with low risk of coronary artery disease  Acute gastritis without hemorrhage, unspecified gastritis type    ED Discharge Orders          Ordered    ondansetron  (ZOFRAN -ODT) 4 MG disintegrating tablet  Every 8 hours PRN        01/26/24 0911               Doretha Folks, MD 01/26/24 9084

## 2024-01-26 NOTE — ED Notes (Signed)
 Called lab to add d-dimer, hepatic fxn, and lipase.

## 2024-01-26 NOTE — ED Triage Notes (Signed)
 Patient reports headache, shortness of breath, nausea, and chest pain since last night.

## 2024-02-17 ENCOUNTER — Other Ambulatory Visit: Payer: Self-pay | Admitting: Family Medicine

## 2024-02-17 DIAGNOSIS — Z1231 Encounter for screening mammogram for malignant neoplasm of breast: Secondary | ICD-10-CM

## 2024-02-22 ENCOUNTER — Other Ambulatory Visit (HOSPITAL_COMMUNITY): Payer: Self-pay | Admitting: Psychiatry

## 2024-02-22 DIAGNOSIS — F411 Generalized anxiety disorder: Secondary | ICD-10-CM

## 2024-02-22 DIAGNOSIS — F331 Major depressive disorder, recurrent, moderate: Secondary | ICD-10-CM

## 2024-02-23 ENCOUNTER — Ambulatory Visit
Admission: RE | Admit: 2024-02-23 | Discharge: 2024-02-23 | Disposition: A | Discharge status: Home / Self Care | Source: Ambulatory Visit | Attending: Family Medicine | Admitting: Family Medicine

## 2024-02-23 DIAGNOSIS — Z1231 Encounter for screening mammogram for malignant neoplasm of breast: Secondary | ICD-10-CM

## 2024-03-15 ENCOUNTER — Other Ambulatory Visit (HOSPITAL_COMMUNITY): Payer: Self-pay | Admitting: Psychiatry

## 2024-03-15 DIAGNOSIS — F411 Generalized anxiety disorder: Secondary | ICD-10-CM

## 2024-03-15 DIAGNOSIS — F331 Major depressive disorder, recurrent, moderate: Secondary | ICD-10-CM

## 2024-04-18 ENCOUNTER — Ambulatory Visit: Admitting: Family Medicine

## 2024-04-18 ENCOUNTER — Encounter: Payer: Self-pay | Admitting: Family Medicine

## 2024-04-18 ENCOUNTER — Other Ambulatory Visit: Payer: Self-pay | Admitting: Family Medicine

## 2024-04-18 VITALS — BP 110/72 | HR 72 | Ht 65.0 in | Wt 181.8 lb

## 2024-04-18 DIAGNOSIS — Z1211 Encounter for screening for malignant neoplasm of colon: Secondary | ICD-10-CM

## 2024-04-18 DIAGNOSIS — E7800 Pure hypercholesterolemia, unspecified: Secondary | ICD-10-CM | POA: Diagnosis not present

## 2024-04-18 DIAGNOSIS — Z8639 Personal history of other endocrine, nutritional and metabolic disease: Secondary | ICD-10-CM | POA: Diagnosis not present

## 2024-04-18 DIAGNOSIS — R5383 Other fatigue: Secondary | ICD-10-CM

## 2024-04-18 DIAGNOSIS — R0683 Snoring: Secondary | ICD-10-CM

## 2024-04-18 DIAGNOSIS — N644 Mastodynia: Secondary | ICD-10-CM

## 2024-04-18 DIAGNOSIS — Z23 Encounter for immunization: Secondary | ICD-10-CM

## 2024-04-18 DIAGNOSIS — F331 Major depressive disorder, recurrent, moderate: Secondary | ICD-10-CM

## 2024-04-18 DIAGNOSIS — F411 Generalized anxiety disorder: Secondary | ICD-10-CM

## 2024-04-18 LAB — POCT GLYCOSYLATED HEMOGLOBIN (HGB A1C): Hemoglobin A1C: 5.7 % — AB (ref 4.0–5.6)

## 2024-04-18 MED ORDER — MIRTAZAPINE 7.5 MG PO TABS: 15.0000 mg | ORAL_TABLET | Freq: Every day | ORAL | 2 refills | Status: DC

## 2024-04-18 NOTE — Progress Notes (Signed)
    SUBJECTIVE:   Chief compliant/HPI: annual examination  RYNA BECKSTROM is a 48 y.o. who presents today for an annual exam.   History tabs reviewed and updated.   Review of systems form reviewed and notable for bilateral breast tenderness, worse on the right breast without mass, daytime fatigue, snoring, difficulty falling asleep and restless legs.   OBJECTIVE:   BP 110/72   Pulse 72   Ht 5' 5 (1.651 m)   Wt 181 lb 12.8 oz (82.5 kg)   BMI 30.25 kg/m   General: A&O, NAD HEENT: No sign of trauma, EOM grossly intact Cardiac: RRR, no m/r/g Respiratory: CTAB, normal WOB, no w/c/r Breast:: breasts appear normal, no suspicious masses, no skin or nipple changes or axillary nodes, symmetric fibrous changes in both upper outer quadrants, right breast normal without mass, skin or nipple changes or axillary nodes, left breast normal without mass, skin or nipple changes or axillary nodes. TTP diffusely  GI: Soft, NTTP, non-distended  Extremities: NTTP, no peripheral edema. Neuro: Normal gait, moves all four extremities appropriately. Psych: Appropriate mood and affect   ASSESSMENT/PLAN:   Assessment & Plan History of elevated glucose A1c today 5.7.  Discussed lifestyle modification. Snoring Patient with history of snoring and daytime fatigue.,  Given symptoms and BMI of 30, ordered sleep study to rule out sleep apnea. Hypercholesteremia - lipid panel today. Based on prior lipid panel, ASCVD risk 0.9%, statin not recommended.  GAD (generalized anxiety disorder) Increase remeron  to 15mg  daily, one month follow up  Breast tenderness Patient with multiple second degree relatives with breast cancer included two aunts, grandmother, and cousins. Will order diagnostic mammogram and refer to genetic testing given history and current bilateral breast tenderness.  Annual Examination  See AVS for age appropriate recommendations.   PHQ score  Flowsheet Row Office Visit from 04/18/2024 in  [facility] [place]  PHQ-9 Total Score 15   , reviewed and discussed.  Blood pressure reviewed and at goal.  Asked about intimate partner violence and resources given as appropriate  The patient currently uses IUD for contraception. Folate recommended as appropriate, minimum of 400 mcg per day.   Considered the following items based upon USPSTF recommendations: Diabetes screening: ordered HIV testing:recently completed and result reviewed, normal  Hepatitis C: recently completed and result reviewed, normal  Hepatitis B:recently completed and result reviewed, normal  Syphilis if at high risk: discussed and declined GC/CT not at high risk and not ordered. Lipid panel (nonfasting or fasting) discussed based upon AHA recommendations and ordered.  Consider repeat every 4-6 years.  Reviewed risk factors for latent tuberculosis and not indicated   Discussed family history, BRCA testing requested.  Cervical cancer screening: due for pap today, deferred, will obtain at next visit  Breast cancer screening: recently completed and repeat not yet indicated, will order genetic testing referral  Colorectal cancer screening: discussed, colonoscopy ordered if age 23 or over.   Follow up in 1 year or sooner if indicated.  MyChart Activation: Already signed up  Gloriann Ogren, MD [facility]

## 2024-04-18 NOTE — Assessment & Plan Note (Signed)
 Increase remeron  to 15mg  daily, one month follow up

## 2024-04-18 NOTE — Patient Instructions (Signed)
 It was wonderful to see you today.  Please bring ALL of your medications with you to every visit.   Today we talked about:  I ordered a colonoscopy I ordered a diagnostic mammogram and placed a genetics referral, they will call you We are getting some lab work I increased your dose of remeron    Thank you for choosing [facility].   Please call [phone] with any questions about today's appointment.  Please arrive at least 15 minutes prior to your scheduled appointments.   If you had blood work today, I will send you a MyChart message or a letter if results are normal. Otherwise, I will give you a call.   If you had a referral placed, they will call you to set up an appointment. Please give us  a call if you don't hear back in the next 2 weeks.   If you need additional refills before your next appointment, please call your pharmacy first.   Do you need your medications delivered to your home?   We'll send your prescription to the [facility] for delivery.          Address: [address] [place], [place], [place] [address]          Phone: [phone]  Please call the [facility] to speak with a pharmacist and set up your home medication delivery. If you have any questions, feel free to contact us  -- we're happy to help!  Other [facility] that offer affordable prices on both prescriptions and over-the-counter items, as well as convenient services like vaccinations, are  [facility], at [facility]         Address:  [address], [place], [place] [address]         Phone: [phone]  [facility], located in the [facility]        Address: [address], [place], [place] [address]        Phone: [phone]  [facility], at [facility]       Address: [address], [place], [place] [address]       Phone: [phone]  [facility], at [facility]       Address: [address], [place], [place] [address]       Phone: [phone]  You should follow up in our clinic in No follow-ups on file.  Gloriann Ogren, MD Family Medicine

## 2024-04-19 ENCOUNTER — Ambulatory Visit: Payer: Self-pay | Admitting: Family Medicine

## 2024-04-19 LAB — BASIC METABOLIC PANEL WITH GFR
BUN/Creatinine Ratio: 9 (ref 9–23)
BUN: 7 mg/dL (ref 6–24)
CO2: 23 mmol/L (ref 20–29)
Calcium: 9.6 mg/dL (ref 8.7–10.2)
Chloride: 103 mmol/L (ref 96–106)
Creatinine, Ser: 0.81 mg/dL (ref 0.57–1.00)
Glucose: 87 mg/dL (ref 70–99)
Potassium: 4.2 mmol/L (ref 3.5–5.2)
Sodium: 141 mmol/L (ref 134–144)
eGFR: 91 mL/min/1.73 (ref >59)

## 2024-04-19 LAB — LIPID PANEL
Chol/HDL Ratio: 4.4 ratio (ref 0.0–4.4)
Cholesterol, Total: 243 mg/dL — ABNORMAL HIGH (ref 100–199)
HDL: 55 mg/dL (ref >39)
LDL Chol Calc (NIH): 168 mg/dL — ABNORMAL HIGH (ref 0–99)
Triglycerides: 111 mg/dL (ref 0–149)
VLDL Cholesterol Cal: 20 mg/dL (ref 5–40)

## 2024-04-19 LAB — IRON,TIBC AND FERRITIN PANEL
Ferritin: 73 ng/mL (ref 15–150)
Iron Saturation: 34 % (ref 15–55)
Iron: 117 ug/dL (ref 27–159)
Total Iron Binding Capacity: 348 ug/dL (ref 250–450)
UIBC: 231 ug/dL (ref 131–425)

## 2024-05-03 ENCOUNTER — Ambulatory Visit: Admission: EM | Admit: 2024-05-03 | Discharge: 2024-05-03 | Disposition: A | Discharge status: Home / Self Care

## 2024-05-03 ENCOUNTER — Encounter: Payer: Self-pay | Admitting: Emergency Medicine

## 2024-05-03 DIAGNOSIS — U071 COVID-19: Secondary | ICD-10-CM | POA: Diagnosis not present

## 2024-05-03 LAB — POC COVID19/FLU A&B COMBO
Covid Antigen, POC: POSITIVE — AB
Influenza A Antigen, POC: NEGATIVE
Influenza B Antigen, POC: NEGATIVE

## 2024-05-03 NOTE — Discharge Instructions (Addendum)

## 2024-05-03 NOTE — ED Triage Notes (Signed)
 Pt presents c/o URI x 4 days. Pt states,  I been sick since Saturday. I got a cough, my head hurts, my chest is tight, and my body aches. I haven't been to work in 2 days because of the sxs.  Pt denies emesis and diarrhea.

## 2024-05-03 NOTE — ED Provider Notes (Addendum)
 [facility]    CSN: [id number] Arrival date & time: 05/03/24  0809      History   Chief Complaint Chief Complaint  Patient presents with   URI    HPI Dana Knight is a 48 y.o. female.   Pt presents today due to viral illness for the past 4 days. Pt states that she has been experiencing cough, loss of appetite, sneezing, nasal congestion, sweats, and body aches. Pt denies known sick contacts or fever. Pt states that she has been taking Theraflu and Tylenol  cold and flu twice a day without relief of symptoms.   The history is provided by the patient.  URI   Past Medical History:  Diagnosis Date   Abnormal Pap smear    Anxiety    Bipolar affective disorder, current episode mixed (HCC) 04/06/2018   Contact dermatitis 07/05/2021   Ear pain 07/05/2021   Healthcare maintenance 12/23/2019   History of chlamydia    HSV (herpes simplex virus) anogenital infection    Loss of weight 11/30/2016   Lower abdominal pain 10/03/2014   No pertinent past medical history    Pap smear for cervical cancer screening 06/25/2019   Upper airway cough syndrome 09/03/2021    Patient Active Problem List   Diagnosis Date Noted   GAD (generalized anxiety disorder) 04/21/2022   Mood disorder 02/13/2021   MDD (major depressive disorder) 08/29/2014    Past Surgical History:  Procedure Laterality Date   COLPOSCOPY  2010   DILATION AND CURETTAGE OF UTERUS     NO PAST SURGERIES      OB History     Gravida  5   Para  3   Term  3   Preterm  0   AB  2   Living  3      SAB  0   IAB  0   Ectopic  0   Multiple  0   Live Births  3            Home Medications    Prior to Admission medications   Medication Sig Start Date End Date Taking? Authorizing Provider  QUEtiapine  (SEROQUEL ) 100 MG tablet Take 100 mg by mouth at bedtime. 12/25/23  Yes [provider]  albuterol  (VENTOLIN  HFA) 108 (90 Base) MCG/ACT inhaler Inhale 2 puffs into the lungs every 4  (four) hours as needed for wheezing or shortness of breath. 09/01/21   Hope Merle, MD  cetirizine  (ZYRTEC ) 10 MG tablet TAKE 1 TABLET BY MOUTH EVERY DAY 12/09/22   Paige, Victoria J, DO  fluticasone  (FLONASE ) 50 MCG/ACT nasal spray Place 2 sprays into both nostrils daily. 07/02/21   Hope Merle, MD  hydrocortisone  1 % ointment Apply 1 application topically 2 (two) times daily. 07/02/21   Hope Merle, MD  levonorgestrel  (MIRENA ) 20 MCG/24HR IUD 1 each by Intrauterine route once.    [provider]  mirtazapine  (REMERON ) 7.5 MG tablet Take 2 tablets (15 mg total) by mouth at bedtime. 04/18/24   Baloch, Mahnoor, MD  ondansetron  (ZOFRAN -ODT) 4 MG disintegrating tablet Take 1 tablet (4 mg total) by mouth every 8 (eight) hours as needed for nausea or vomiting. 01/26/24   Doretha Folks, MD    Family History Family History  Problem Relation Age of Onset   Stroke Mother    Diabetes Father    Diabetes Maternal Grandmother    Thyroid  disease Maternal Aunt    Breast cancer Neg Hx     Social History  Social History   Tobacco Use   Smoking status: Never    Passive exposure: Never   Smokeless tobacco: Never  Vaping Use   Vaping status: Never Used  Substance Use Topics   Alcohol use: Yes    Comment: occasional   Drug use: No     Allergies   Diflucan  [fluconazole ]   Review of Systems Review of Systems   Physical Exam Triage Vital Signs ED Triage Vitals  Encounter Vitals Group     BP 05/03/24 0843 132/89     Girls Systolic BP Percentile --      Girls Diastolic BP Percentile --      Boys Systolic BP Percentile --      Boys Diastolic BP Percentile --      Pulse Rate 05/03/24 0843 71     Resp 05/03/24 0843 20     Temp 05/03/24 0843 98.6 F (37 C)     Temp Source 05/03/24 0843 Oral     SpO2 05/03/24 0843 100 %     Weight 05/03/24 0842 181 lb 14.1 oz (82.5 kg)     Height --      Head Circumference --      Peak Flow --      Pain Score 05/03/24 0841 6     Pain Loc --       Pain Education --      Exclude from Growth Chart --    No data found.  Updated Vital Signs BP 132/89 (BP Location: Left Arm)   Pulse 71   Temp 98.6 F (37 C) (Oral)   Resp 20   Wt 181 lb 14.1 oz (82.5 kg)   LMP 04/30/2024 (Exact Date)   SpO2 100%   BMI 30.27 kg/m   Visual Acuity Right Eye Distance:   Left Eye Distance:   Bilateral Distance:    Right Eye Near:   Left Eye Near:    Bilateral Near:     Physical Exam Vitals and nursing note reviewed.  Constitutional:      General: She is not in acute distress.    Appearance: Normal appearance. She is ill-appearing. She is not toxic-appearing or diaphoretic.  HENT:     Nose: Congestion (markedly enlarged turbinates) present. No rhinorrhea.     Mouth/Throat:     Mouth: Mucous membranes are moist.     Pharynx: Oropharynx is clear. No oropharyngeal exudate or posterior oropharyngeal erythema.  Eyes:     General: No scleral icterus. Cardiovascular:     Rate and Rhythm: Normal rate and regular rhythm.     Heart sounds: Normal heart sounds.  Pulmonary:     Effort: Pulmonary effort is normal. No respiratory distress.     Breath sounds: Normal breath sounds. No wheezing or rhonchi.  Skin:    General: Skin is warm.  Neurological:     Mental Status: She is alert and oriented to person, place, and time.  Psychiatric:        Mood and Affect: Mood normal.        Behavior: Behavior normal.      UC Treatments / Results  Labs (all labs ordered are listed, but only abnormal results are displayed) Labs Reviewed  POC COVID19/FLU A&B COMBO - Abnormal; Notable for the following components:      Result Value   Covid Antigen, POC Positive (*)    All other components within normal limits    EKG   Radiology No results found.  Procedures Procedures (including  critical care time)  Medications Ordered in UC Medications - No data to display  Initial Impression / Assessment and Plan / UC Course  I have reviewed the  triage vital signs and the nursing notes.  Pertinent labs & imaging results that were available during my care of the patient were reviewed by me and considered in my medical decision making (see chart for details).    Final Clinical Impressions(s) / UC Diagnoses   Final diagnoses:  COVID-19     Discharge Instructions      You been diagnosed with a viral illness today. -Viruses have to run their course and medicines that are prescribed are meant to help with symptoms. - With viruses usually feel poorly from 3 to 7 days with cough being the last symptoms to resolve.  -Cough can linger from days to weeks.  Antibiotics are not effective for viruses. -If your cough lasts more than 2 weeks and you are coughing so hard that you are vomiting or feel like you could pass out we need to follow-up with PCP for further testing and evaluation. -Rest, increase water intake, may use pseudoephedrine for nasal congestion, Delsym (dextromethorphan) or honey as needed for cough, and ibuprofen  and/or Tylenol  as directed on packaging for pain and fever. -If you have hypertension you should take Coricidin or other OTC meds approved for people with high blood pressure. -You may use a spoonful of honey every 4-6 hours as needed for throat pain and cough. -Warm tea with honey and lemon are helpful for soothe throat as well.  Chloraseptic and Cepacol make a throat lozenge with numbing medication, can be purchased over-the-counter. -May also use Flonase  or sinus rinse for sinus pressure or nasal congestion.  Be sure to use distilled bottled water for sinus rinses. -May use coolmist humidifier to open up nasal passages -May elevate head to assist with postnasal drainage. -If you feel poorly (fever, fatigue, shortness of breath, nausea, etc.) for more than 10 days to be sure to follow-up with PCP or in clinic for further evaluation and additional treatments. If you experience chest pain with shortness of breath or  pulse oxygen less than 95% you should report to the ER.      ED Prescriptions   None    PDMP not reviewed this encounter.   Andra Corean BROCKS, PA-C 05/03/24 0901    Andra Corean BROCKS, PA-C 05/03/24 [phone]

## 2024-05-05 ENCOUNTER — Encounter

## 2024-05-05 ENCOUNTER — Other Ambulatory Visit

## 2024-05-09 ENCOUNTER — Telehealth (HOSPITAL_COMMUNITY): Admitting: Psychiatry

## 2024-05-16 ENCOUNTER — Other Ambulatory Visit: Payer: Self-pay | Admitting: Family Medicine

## 2024-05-16 DIAGNOSIS — F331 Major depressive disorder, recurrent, moderate: Secondary | ICD-10-CM

## 2024-05-16 DIAGNOSIS — F411 Generalized anxiety disorder: Secondary | ICD-10-CM

## 2024-05-22 NOTE — Progress Notes (Unsigned)
 BH MD/PA/NP OP Progress Note  05/27/2024 11:25 AM TENESHA GARZA  MRN:  [id number]  Visit Diagnosis:    ICD-10-CM   1. GAD (generalized anxiety disorder)  F41.1 sertraline  (ZOLOFT ) 100 MG tablet    2. Moderate episode of recurrent major depressive disorder (HCC)  F33.1 sertraline  (ZOLOFT ) 100 MG tablet       Assessment: LYNDZEE KLIEBERT is a 48 y.o. female with a history of anxiety and depression who presented to [facility] at [facility] for initial evaluation on 04/21/2022.  Per initial evaluation patient reported neurovegetative symptoms of depression including anhedonia, amotivation, increased fatigue, changes in sleep pattern, decreased appetite, and negative thought pattern.  She denied any SI/HI or thoughts of self-harm.  Patient also endorsed symptoms of anxiety including constant worry that she is unable to control, restlessness, irritability, fear that something awful is going to happen, and difficulty relaxing.  Anxiety symptoms can be worse secondary to psychosocial stressors including negative interactions with her mother and work stressors.  Patient denies any history of mania, delusions, paranoia, or auditory or visual hallucinations.  Patient reported having good support in her sister.  Patient met criteria for diagnosis of GAD and MDD.  Machel D Chuong presents for follow-up evaluation. Today, 05/27/2024, patient reports worsening neurovegetative symptoms of depression and increase in anxiety.  These are secondary to seasonal depression as well as increased situational stressors.  Patient is taking medications consistently that adverse side effect but benefit has been reduced.  Will titrate Zoloft  100 mg daily.  Also recommended starting daily light therapy with 10,000 Lux light.  Instructions were provided.  Will follow-up in a month.  Psychotherapeutic interventions were used during today's session. From 10:35 AM to 10:55 AM. Therapeutic interventions  included empathic listening, supportive therapy, cognitive and behavioral therapy. Used supportive interviewing techniques to provide emotional validation. Worked on cognitive reframing techniques and unhelpful thoughts challenged as appropriate. Alternative thoughts developed with guidance. Reviewed some techniques to facilitate increased behavioral activation. Improvement was evidenced by patient's participation and identified commitment to therapy goals.   Plan: - Increase Zoloft  to 100 mg QD  - Continue mirtazapine  7.5 mg at bedtime - Daily light therapy with 10,000 Lux light recommended - CMP, CBC, TSH, UA reviewed - Restart therapy in the future if needed - Crisis resources reviewed - Follow up in a month  Chief Complaint:  Chief Complaint  Patient presents with   Follow-up   HPI: Evon presents for follow up reporting that the past 9 months had gone well up until late fall.  She has noticed that with the season change her depression has come back more significantly.  Patient describes feeling increased fatigue, amotivation, negative self thoughts, decreased concentration, and increased desire to sleep.  She denies any SI or thoughts of harming himself but has missed work a few days.  The depression can fluctuate with some days being worse than others.  Patient is attempting to keep herself busy as she realizes staying at home will only worsen the symptoms.  That said she is only been able to keep active part of the time.  In addition to this patient has recently ended a 13-year relationship.  The partner was not good for her and it was taking a mental toll.  While there has been some improvements with the separation patient notes that having a 39 year old child that they are coparenting has been difficult.  The partner is not bad for the son but just bad for Kariya.  Patient notes that they initially tried split custody but her son preferred to be at his father's more mainly due to the  Internet.  To try and make her son happy she has let him live there but he will frequently reach out saying that he misses her or her cooking.  At those times patient will take over things but finds it stressful to be around her ex.  Empathic listening techniques were used and support was provided.  Discussed the importance of setting boundaries and and making sure to take her self-care into account.  Furthermore reviewed that her son is 25 years old and can respond to the idea that he would need to stay with her when he wants her cooking.  Medication wise patient reports taking the Zoloft  and mirtazapine  consistently without adverse side effects.  Medicine has seemed less effective over the last couple months though.  We will titrate Zoloft  to 100 mg daily.  We also discussed light therapy and reviewed instructions on how to use a 10,000 Lux light.  Reviewed the benefit of restarting therapy.  Past Psychiatric History: Patient reported minimal psychiatric history.  She denies prior psychiatric hospitalizations or past suicide attempts.  She believes that she had been diagnosed with bipolar disorder in the past by her PCP.  Patient has been on Seroquel  and the past.  Started on Zoloft  and propranolol  on 04/21/2022.  Patient denied any substance use.  Past Medical History:  Past Medical History:  Diagnosis Date   Abnormal Pap smear    Anxiety    Bipolar affective disorder, current episode mixed (HCC) 04/06/2018   Contact dermatitis 07/05/2021   Ear pain 07/05/2021   Healthcare maintenance 12/23/2019   History of chlamydia    HSV (herpes simplex virus) anogenital infection    Loss of weight 11/30/2016   Lower abdominal pain 10/03/2014   No pertinent past medical history    Pap smear for cervical cancer screening 06/25/2019   Upper airway cough syndrome 09/03/2021    Past Surgical History:  Procedure Laterality Date   COLPOSCOPY  2010   DILATION AND CURETTAGE OF UTERUS     NO PAST SURGERIES       Family History:  Family History  Problem Relation Age of Onset   Stroke Mother    Diabetes Father    Diabetes Maternal Grandmother    Thyroid  disease Maternal Aunt    Breast cancer Neg Hx     Social History:  Social History   Socioeconomic History   Marital status: Single    Spouse name: Not on file   Number of children: Not on file   Years of education: Not on file   Highest education level: Not on file  Occupational History   Not on file  Tobacco Use   Smoking status: Never    Passive exposure: Never   Smokeless tobacco: Never  Vaping Use   Vaping status: Never Used  Substance and Sexual Activity   Alcohol use: Yes    Comment: occasional   Drug use: No   Sexual activity: Never    Birth control/protection: I.U.D.  Other Topics Concern   Not on file  Social History Narrative         Social Drivers of Health   Tobacco Use: Low Risk (05/27/2024)   Patient History    Smoking Tobacco Use: Never    Smokeless Tobacco Use: Never    Passive Exposure: Never  Financial Resource Strain: Not on file  Food Insecurity:  No Food Insecurity (07/17/2021)   Hunger Vital Sign    Worried About Running Out of Food in the Last Year: Never true    Ran Out of Food in the Last Year: Never true  Transportation Needs: No Transportation Needs (07/17/2021)   PRAPARE - Administrator, Civil Service (Medical): No    Lack of Transportation (Non-Medical): No  Physical Activity: Not on file  Stress: Stress Concern Present (07/17/2021)   [employer] of Occupational Health - Occupational Stress Questionnaire    Feeling of Stress : Very much  Social Connections: Not on file  Depression (PHQ2-9): High Risk (04/18/2024)   Depression (PHQ2-9)    PHQ-2 Score: 15  Alcohol Screen: Not on file  Housing: Low Risk (07/17/2021)   Housing    Last Housing Risk Score: 0  Utilities: Not on file  Health Literacy: Not on file    Allergies:  Allergies  Allergen Reactions   Diflucan   [Fluconazole ] Swelling    Current Medications: Current Outpatient Medications  Medication Sig Dispense Refill   sertraline  (ZOLOFT ) 100 MG tablet Take 1 tablet (100 mg total) by mouth daily. 30 tablet 2   albuterol  (VENTOLIN  HFA) 108 (90 Base) MCG/ACT inhaler Inhale 2 puffs into the lungs every 4 (four) hours as needed for wheezing or shortness of breath. 8 g 2   cetirizine  (ZYRTEC ) 10 MG tablet TAKE 1 TABLET BY MOUTH EVERY DAY 30 tablet 11   fluticasone  (FLONASE ) 50 MCG/ACT nasal spray Place 2 sprays into both nostrils daily. 16 g 6   hydrocortisone  1 % ointment Apply 1 application topically 2 (two) times daily. 30 g 0   levonorgestrel  (MIRENA ) 20 MCG/24HR IUD 1 each by Intrauterine route once.     mirtazapine  (REMERON ) 7.5 MG tablet Take 2 tablets (15 mg total) by mouth at bedtime. 60 tablet 2   ondansetron  (ZOFRAN -ODT) 4 MG disintegrating tablet Take 1 tablet (4 mg total) by mouth every 8 (eight) hours as needed for nausea or vomiting. 10 tablet 0   No current facility-administered medications for this visit.     Psychiatric Specialty Exam: Review of Systems  Last menstrual period 04/30/2024.There is no height or weight on file to calculate BMI.  General Appearance: Well Groomed  Eye Contact:  Good  Speech:  Clear and Coherent  Volume:  Normal  Mood:  Depressed  Affect:  Congruent  Thought Process:  Coherent and Goal Directed  Orientation:  Full (Time, Place, and Person)  Thought Content: Logical   Suicidal Thoughts:  No  Homicidal Thoughts:  No  Memory:  Immediate;   Good Recent;   Fair  Judgement:  Good  Insight:  Fair  Psychomotor Activity:  Normal  Concentration:  Concentration: Good  Recall:  Fair  Fund of Knowledge: Fair  Language: Good  Akathisia:  NA    AIMS (if indicated): not done  Assets:  Communication Skills Desire for Improvement Financial Resources/Insurance Housing Physical Health Transportation Vocational/Educational  ADL's:  Intact  Cognition:  WNL  Sleep:  Good   Metabolic Disorder Labs: Lab Results  Component Value Date   HGBA1C 5.7 (A) 04/18/2024   No results found for: PROLACTIN Lab Results  Component Value Date   CHOL 243 (H) 04/18/2024   TRIG 111 04/18/2024   HDL 55 04/18/2024   CHOLHDL 4.4 04/18/2024   LDLCALC 168 (H) 04/18/2024   LDLCALC 219 (H) 07/21/2022   Lab Results  Component Value Date   TSH 1.600 12/04/2020   TSH 2.080 11/30/2016  Therapeutic Level Labs: No results found for: LITHIUM No results found for: VALPROATE No results found for: CBMZ   Screenings: GAD-7    Flowsheet Row Office Visit from 04/21/2022 in [facility] - A Dept Of [place]. [facility] Office Visit from 04/10/2022 in [facility] - A Dept Of [place]. [facility] Office Visit from 07/21/2021 in [facility] - A Dept Of [place]. [facility] Office Visit from 05/28/2020 in [facility] - A Dept Of [place]. [facility] Office Visit from 05/20/2020 in [facility] - A Dept Of [place]. [facility]  Total GAD-7 Score 19 19 6 7 7    PHQ2-9    Flowsheet Row Office Visit from 04/18/2024 in [facility] - A Dept Of [place]. [facility] Office Visit from 04/12/2023 in [facility] - A Dept Of [place]. [facility] Office Visit from 07/21/2022 in [facility] - A Dept Of [place]. [facility] Office Visit from 06/22/2022 in [facility] - A Dept Of [place]. [facility] Office Visit from 04/28/2022 in [facility] - A Dept Of [place]. [facility]  PHQ-2 Total Score 4 2 2 2 2   PHQ-9 Total Score 15 8 9 8 8    Flowsheet Row UC from 05/03/2024 in [facility] at [facility] ED from 01/26/2024 in [facility] Emergency Department at [facility] ED from 04/22/2023 in [facility]  Emergency Department at [facility]  C-SSRS RISK CATEGORY No Risk No Risk No Risk    Collaboration of Care: Collaboration of Care: Medication Management AEB medication prescription, Primary Care Provider AEB chart review, and Other provider involved in patient's care AEB ED  and urgent carechart review  Patient/Guardian was advised Release of Information must be obtained prior to any record release in order to collaborate their care with an outside provider. Patient/Guardian was advised if they have not already done so to contact the registration department to sign all necessary forms in order for us  to release information regarding their care.   Consent: Patient/Guardian gives verbal consent for treatment and assignment of benefits for services provided during this visit. Patient/Guardian expressed understanding and agreed to proceed.    Arvella CHRISTELLA Finder, MD 05/27/2024, 11:25 AM   Virtual Visit via Video Note  I connected with Sharene Blush on 05/27/2024 at 11:30 AM EST by a video enabled telemedicine application and verified that I am speaking with the correct person using two identifiers.  Location: Patient: Home Provider: Home Office   I discussed the limitations of evaluation and management by telemedicine and the availability of in person appointments. The patient expressed understanding and agreed to proceed.   I discussed the assessment and treatment plan with the patient. The patient was provided an opportunity to ask questions and all were answered. The patient agreed with the plan and demonstrated an understanding of the instructions.   The patient was advised to call back or seek an in-person evaluation if the symptoms worsen or if the condition fails to improve as anticipated.  I provided 20 minutes of non-face-to-face time during this encounter.   Arvella CHRISTELLA Finder, MD

## 2024-05-23 ENCOUNTER — Encounter: Payer: Self-pay | Admitting: Family Medicine

## 2024-05-23 ENCOUNTER — Other Ambulatory Visit (HOSPITAL_COMMUNITY)
Admission: RE | Admit: 2024-05-23 | Discharge: 2024-05-23 | Disposition: A | Discharge status: Home / Self Care | Source: Ambulatory Visit | Attending: Family Medicine | Admitting: Family Medicine

## 2024-05-23 ENCOUNTER — Ambulatory Visit: Admitting: Family Medicine

## 2024-05-23 VITALS — BP 128/63 | HR 71 | Ht 65.0 in | Wt 180.0 lb

## 2024-05-23 DIAGNOSIS — Z124 Encounter for screening for malignant neoplasm of cervix: Secondary | ICD-10-CM

## 2024-05-23 DIAGNOSIS — F411 Generalized anxiety disorder: Secondary | ICD-10-CM

## 2024-05-23 DIAGNOSIS — N644 Mastodynia: Secondary | ICD-10-CM | POA: Insufficient documentation

## 2024-05-23 DIAGNOSIS — F331 Major depressive disorder, recurrent, moderate: Secondary | ICD-10-CM

## 2024-05-23 DIAGNOSIS — E7800 Pure hypercholesterolemia, unspecified: Secondary | ICD-10-CM

## 2024-05-23 DIAGNOSIS — Z113 Encounter for screening for infections with a predominantly sexual mode of transmission: Secondary | ICD-10-CM

## 2024-05-23 NOTE — Assessment & Plan Note (Signed)
 Ongoing depressive symptoms. Last PHQ-9 was 15, patient reports similar today. Adjusted remeron  at last visit as patient was not interested in starting new medication. Would like to get sleep study before starting medication. - continue remeron  15mg  at bedtime  - continue seroquel  25 mg at bedtime  - 1 month follow up after sleep study

## 2024-05-23 NOTE — Patient Instructions (Addendum)
 It was wonderful to see you today.  Please bring ALL of your medications with you to every visit.   Today we talked about:  High Cholesterol: lets focus on changes in your diet and increasing your exercise to limit this impacting you long term.   Pap Smear: We collected this today, I will tell you if there is anything abnormal that warrants more investigation  Please be on the lookout for a call regarding your colonoscopy.   Sleep: please call [phone] to schedule an appointment   [facility] [address], [place], [place] [address] Phone: [phone]  Thank you for choosing [facility].   Please call [phone] with any questions about today's appointment.  Please arrive at least 15 minutes prior to your scheduled appointments.   If you had blood work today, I will send you a MyChart message or a letter if results are normal. Otherwise, I will give you a call.   If you had a referral placed, they will call you to set up an appointment. Please give us  a call if you don't hear back in the next 2 weeks.   If you need additional refills before your next appointment, please call your pharmacy first.   Do you need your medications delivered to your home?   We'll send your prescription to the [facility] for delivery.          Address: [address] [place], [place], [place] [address]          Phone: [phone]  Please call the [facility] to speak with a pharmacist and set up your home medication delivery. If you have any questions, feel free to contact us  -- we're happy to help!  Other [place] [facility] that offer affordable prices on both prescriptions and over-the-counter items, as well as convenient services like vaccinations, are  [facility], at [facility]         Address:  [address], [place], [place] [address]         Phone: [phone]  [facility], located in the [facility]        Address: [address], [place], [place] [address]        Phone: [phone]  [facility], at [facility]       Address: [address], [place], [place] [address]       Phone: [phone]  [facility], at [facility]       Address: [address], [place], [place] [address]       Phone: [phone]  You should follow up in our clinic in [place] in about 4 weeks (around 06/20/2024).  Gloriann Ogren, MD Family Medicine

## 2024-05-23 NOTE — Assessment & Plan Note (Addendum)
 Ongoing tenderness, unchanged from prior exam. Has mammogram scheduled for 12/17 and meeting with genetics in January.

## 2024-05-23 NOTE — Progress Notes (Signed)
    SUBJECTIVE:   CHIEF COMPLAINT / HPI:   Here for follow up on chronic conditions.  HLD: elevated LDL at last visit. Patient is adjusting diet and trying to exercise more often to manage this.She is working on decreased fried food, not eating [employer]. Is taking her own breakfast to work. Is also exercising more.   GAD/MDD: increased Remeron  to 15mg  at last visit, patient reports still having difficulty sleeping and some low mood. She thinks this is also due to sleeping poorly which she feels is from her snoring which wakes her up at night  Snoring: sleep study was ordered at last visit, thinks she got a call from them but has not returned to schedule an appointment.   Breast tenderness: scheduled mammogram for 12/17, genetic counseling  PERTINENT  PMH / PSH:   OBJECTIVE:   BP 128/63   Pulse 71   Ht 5' 5 (1.651 m)   Wt 180 lb (81.6 kg)   LMP 04/30/2024 (Exact Date)   SpO2 100%   BMI 29.95 kg/m   General: A&O, NAD HEENT: No sign of trauma, EOM grossly intact Cardiac: RRR, no m/r/g Respiratory: CTAB, normal WOB, no w/c/r GI: Soft, NTTP, non-distended  Extremities: NTTP, no peripheral edema. Neuro: Normal gait, moves all four extremities appropriately. Psych: Appropriate mood and affect  GU: Normal appearance of labia majora and minora, without lesions. Vagina tissue pink, moist, without lesions or abrasions. Copious, thin white discharge without odor. No lesions or discoloration noted at cervix.  ASSESSMENT/PLAN:   Assessment & Plan Breast tenderness Ongoing tenderness, unchanged from prior exam. Has mammogram scheduled for 12/17 and meeting with genetics in January.  Moderate episode of recurrent major depressive disorder (HCC) Ongoing depressive symptoms. Last PHQ-9 was 15, patient reports similar today. Adjusted remeron  at last visit as patient was not interested in starting new medication. Would like to get sleep study before starting medication. - continue remeron  15mg   at bedtime  - continue seroquel  25 mg at bedtime  - 1 month follow up after sleep study   Screening examination for STI Only has one sexual partner, uses condoms and has IUD placed. Would like to be screening just to be safe, no current symptoms.  Cervical cancer screening Pap smear completed today  Hypercholesteremia Discussed elevation in cholesterol. Patient is working on lifestyle changes. ASCVD risk <7.5%, thus will not start statin at this time. Congratulated patient on continued efforts, will continue to monitor.    Health Maintence: - referral for colonoscopy placed at last visit. Patient is awaiting call to schedule.   Gloriann Ogren, MD [facility]

## 2024-05-24 ENCOUNTER — Encounter: Payer: Self-pay | Admitting: Gastroenterology

## 2024-05-27 ENCOUNTER — Encounter (HOSPITAL_COMMUNITY): Payer: Self-pay | Admitting: Psychiatry

## 2024-05-27 ENCOUNTER — Telehealth (HOSPITAL_BASED_OUTPATIENT_CLINIC_OR_DEPARTMENT_OTHER): Admitting: Psychiatry

## 2024-05-27 DIAGNOSIS — F331 Major depressive disorder, recurrent, moderate: Secondary | ICD-10-CM

## 2024-05-27 DIAGNOSIS — F411 Generalized anxiety disorder: Secondary | ICD-10-CM

## 2024-05-27 MED ORDER — SERTRALINE HCL 100 MG PO TABS: 100.0000 mg | ORAL_TABLET | Freq: Every day | ORAL | 2 refills | Status: DC

## 2024-05-29 ENCOUNTER — Encounter: Payer: Self-pay | Admitting: Family Medicine

## 2024-05-29 LAB — CYTOLOGY - PAP
Adequacy: ABSENT
Chlamydia: NEGATIVE
Comment: NEGATIVE
Comment: NEGATIVE
Comment: NEGATIVE
Comment: NORMAL
Diagnosis: NEGATIVE
High risk HPV: NEGATIVE
Neisseria Gonorrhea: NEGATIVE
Trichomonas: NEGATIVE

## 2024-05-30 ENCOUNTER — Ambulatory Visit: Payer: Self-pay | Admitting: Family Medicine

## 2024-05-30 DIAGNOSIS — B9689 Other specified bacterial agents as the cause of diseases classified elsewhere: Secondary | ICD-10-CM

## 2024-05-30 MED ORDER — METRONIDAZOLE 500 MG PO TABS: 500.0000 mg | ORAL_TABLET | Freq: Two times a day (BID) | ORAL | 0 refills | Status: AC

## 2024-05-31 ENCOUNTER — Other Ambulatory Visit

## 2024-05-31 ENCOUNTER — Inpatient Hospital Stay: Admission: RE | Admit: 2024-05-31 | Discharge: 2024-05-31 | Attending: Family Medicine | Admitting: Family Medicine

## 2024-05-31 DIAGNOSIS — N644 Mastodynia: Secondary | ICD-10-CM

## 2024-06-12 ENCOUNTER — Encounter

## 2024-06-16 ENCOUNTER — Ambulatory Visit: Payer: Self-pay

## 2024-06-16 VITALS — Ht 65.0 in | Wt 176.0 lb

## 2024-06-16 DIAGNOSIS — Z1211 Encounter for screening for malignant neoplasm of colon: Secondary | ICD-10-CM

## 2024-06-16 MED ORDER — NA SULFATE-K SULFATE-MG SULF 17.5-3.13-1.6 GM/177ML PO SOLN: 1.0000 | Freq: Once | ORAL | 0 refills | Status: AC

## 2024-06-16 NOTE — Progress Notes (Signed)

## 2024-06-19 NOTE — Progress Notes (Signed)
 BH MD/PA/NP OP Progress Note  06/23/2024 11:32 AM Dana Knight  MRN:  [id number]  Visit Diagnosis:    ICD-10-CM   1. GAD (generalized anxiety disorder)  F41.1 mirtazapine  (REMERON ) 15 MG tablet    sertraline  (ZOLOFT ) 100 MG tablet    2. Moderate episode of recurrent major depressive disorder (HCC)  F33.1 mirtazapine  (REMERON ) 15 MG tablet    sertraline  (ZOLOFT ) 100 MG tablet      Assessment: Dana Knight is a 49 y.o. female with a history of anxiety and depression who presented to [facility] for initial evaluation on 04/21/2022.  Per initial evaluation patient reported neurovegetative symptoms of depression including anhedonia, amotivation, increased fatigue, changes in sleep pattern, decreased appetite, and negative thought pattern.  She denied any SI/HI or thoughts of self-harm.  Patient also endorsed symptoms of anxiety including constant worry that she is unable to control, restlessness, irritability, fear that something awful is going to happen, and difficulty relaxing.  Anxiety symptoms can be worse secondary to psychosocial stressors including negative interactions with her mother and work stressors.  Patient denies any history of mania, delusions, paranoia, or auditory or visual hallucinations.  Patient reported having good support in her sister.  Patient met criteria for diagnosis of GAD and MDD.  Dana Knight presents for follow-up evaluation. Today, 06/23/2024, patient has had improvement in neurovegetative symptoms of depression along with anxiety.  She has faced new situational stressors in the interim but been able to handle them without worsening depression.  She has tolerated the sertraline  titration well denying adverse side effects.  Notably she does miss the dose some days and we discussed the importance of consistent medication compliance for stable dosing and to avoid any withdrawal.  Will continue on current regimen and follow-up in 2  to 3 months.  Plan: - Continue Zoloft  100 mg QD  - Continue mirtazapine  7.5 mg at bedtime - Can consider light therapy with 10,000 Lux light in the future - CMP, CBC, TSH, UA reviewed - Restart therapy in the future if needed - Crisis resources reviewed - Follow up in a month  Chief Complaint:  Chief Complaint  Patient presents with   Follow-up   HPI: Dana Knight presents for follow up reporting that things seem a bit better over the last month. She feels like her mood is more stabile and she has handled the stressor of her car breaking down without going into depression.  Furthermore she had improvements in motivation and energy throughout the previously endorsed fatigue.  At home patient has been setting better boundaries with her ex.  Her son also started working and is not home as much which likely contributed to improvement.  Patient take mirtazapine  consistently and the Zoloft  most days.  We did review that intermittently taking Zoloft  can lead to varying levels and mild withdrawal symptoms.  Recommended taking the Zoloft  consistently.  She has not experienced any side effects from the titration.  Patient did not start light therapy and it would be appropriate to hold off on that for now.  Past Psychiatric History: Patient reported minimal psychiatric history.  She denies prior psychiatric hospitalizations or past suicide attempts.  She believes that she had been diagnosed with bipolar disorder in the past by her PCP.  Patient has been on Seroquel  and the past.  Started on Zoloft  and propranolol  on 04/21/2022.  Patient denied any substance use.  Past Medical History:  Past Medical History:  Diagnosis Date  Abnormal Pap smear    Anxiety    Bipolar affective disorder, current episode mixed (HCC) 04/06/2018   Contact dermatitis 07/05/2021   Ear pain 07/05/2021   Healthcare maintenance 12/23/2019   History of chlamydia    HSV (herpes simplex virus) anogenital infection    Loss of  weight 11/30/2016   Lower abdominal pain 10/03/2014   No pertinent past medical history    Pap smear for cervical cancer screening 06/25/2019   Upper airway cough syndrome 09/03/2021    Past Surgical History:  Procedure Laterality Date   COLPOSCOPY  2010   DILATION AND CURETTAGE OF UTERUS     NO PAST SURGERIES      Family History:  Family History  Problem Relation Age of Onset   Stroke Mother    Diabetes Father    Thyroid  disease Maternal Aunt    Diabetes Maternal Grandmother    Breast cancer Neg Hx    Colon cancer Neg Hx    Esophageal cancer Neg Hx    Stomach cancer Neg Hx    Rectal cancer Neg Hx     Social History:  Social History   Socioeconomic History   Marital status: Single    Spouse name: Not on file   Number of children: Not on file   Years of education: Not on file   Highest education level: Not on file  Occupational History   Not on file  Tobacco Use   Smoking status: Never    Passive exposure: Never   Smokeless tobacco: Never  Vaping Use   Vaping status: Never Used  Substance and Sexual Activity   Alcohol use: Yes    Comment: occasional   Drug use: No   Sexual activity: Never    Birth control/protection: I.U.D.  Other Topics Concern   Not on file  Social History Narrative         Social Drivers of Health   Tobacco Use: Low Risk (06/23/2024)   Patient History    Smoking Tobacco Use: Never    Smokeless Tobacco Use: Never    Passive Exposure: Never  Financial Resource Strain: Not on file  Food Insecurity: No Food Insecurity (07/17/2021)   Hunger Vital Sign    Worried About Running Out of Food in the Last Year: Never true    Ran Out of Food in the Last Year: Never true  Transportation Needs: No Transportation Needs (07/17/2021)   PRAPARE - Administrator, Civil Service (Medical): No    Lack of Transportation (Non-Medical): No  Physical Activity: Not on file  Stress: Stress Concern Present (07/17/2021)   [employer] of Occupational  Health - Occupational Stress Questionnaire    Feeling of Stress : Very much  Social Connections: Not on file  Depression (PHQ2-9): High Risk (04/18/2024)   Depression (PHQ2-9)    PHQ-2 Score: 15  Alcohol Screen: Not on file  Housing: Low Risk (07/17/2021)   Housing    Last Housing Risk Score: 0  Utilities: Not on file  Health Literacy: Not on file    Allergies:  Allergies  Allergen Reactions   Diflucan  [Fluconazole ] Swelling    Current Medications: Current Outpatient Medications  Medication Sig Dispense Refill   albuterol  (VENTOLIN  HFA) 108 (90 Base) MCG/ACT inhaler Inhale 2 puffs into the lungs every 4 (four) hours as needed for wheezing or shortness of breath. 8 g 2   cetirizine  (ZYRTEC ) 10 MG tablet TAKE 1 TABLET BY MOUTH EVERY DAY 30 tablet 11  fluticasone  (FLONASE ) 50 MCG/ACT nasal spray Place 2 sprays into both nostrils daily. (Patient not taking: Reported on 06/16/2024) 16 g 6   hydrocortisone  1 % ointment Apply 1 application topically 2 (two) times daily. (Patient not taking: Reported on 06/16/2024) 30 g 0   levonorgestrel  (MIRENA ) 20 MCG/24HR IUD 1 each by Intrauterine route once.     mirtazapine  (REMERON ) 15 MG tablet Take 1 tablet (15 mg total) by mouth at bedtime. 90 tablet 0   ondansetron  (ZOFRAN -ODT) 4 MG disintegrating tablet Take 1 tablet (4 mg total) by mouth every 8 (eight) hours as needed for nausea or vomiting. 10 tablet 0   sertraline  (ZOLOFT ) 100 MG tablet Take 1 tablet (100 mg total) by mouth daily. 90 tablet 0   No current facility-administered medications for this visit.     Psychiatric Specialty Exam: Review of Systems  There were no vitals taken for this visit.There is no height or weight on file to calculate BMI.  General Appearance: Well Groomed  Eye Contact:  Good  Speech:  Clear and Coherent  Volume:  Normal  Mood:  Euthymic  Affect:  Congruent  Thought Process:  Coherent and Goal Directed  Orientation:  Full (Time, Place, and Person)  Thought  Content: Logical   Suicidal Thoughts:  No  Homicidal Thoughts:  No  Memory:  Immediate;   Good Recent;   Fair  Judgement:  Good  Insight:  Fair  Psychomotor Activity:  Normal  Concentration:  Concentration: Good  Recall:  Fair  Fund of Knowledge: Fair  Language: Good  Akathisia:  NA    AIMS (if indicated): not done  Assets:  Communication Skills Desire for Improvement Financial Resources/Insurance Housing Physical Health Transportation Vocational/Educational  ADL's:  Intact  Cognition: WNL  Sleep:  Good   Metabolic Disorder Labs: Lab Results  Component Value Date   HGBA1C 5.7 (A) 04/18/2024   No results found for: PROLACTIN Lab Results  Component Value Date   CHOL 243 (H) 04/18/2024   TRIG 111 04/18/2024   HDL 55 04/18/2024   CHOLHDL 4.4 04/18/2024   LDLCALC 168 (H) 04/18/2024   LDLCALC 219 (H) 07/21/2022   Lab Results  Component Value Date   TSH 1.600 12/04/2020   TSH 2.080 11/30/2016    Therapeutic Level Labs: No results found for: LITHIUM No results found for: VALPROATE No results found for: CBMZ   Screenings: GAD-7    Flowsheet Row Office Visit from 04/21/2022 in [facility] - A Dept Of [place]. [facility] Office Visit from 04/10/2022 in [facility] - A Dept Of [place]. [facility] Office Visit from 07/21/2021 in [facility] - A Dept Of [place]. [facility] Office Visit from 05/28/2020 in [facility] - A Dept Of [place]. [facility] Office Visit from 05/20/2020 in [facility] - A Dept Of [place]. [facility]  Total GAD-7 Score 19 19 6 7 7    PHQ2-9    Flowsheet Row Office Visit from 04/18/2024 in [facility] - A Dept Of [place]. [facility] Office Visit from 04/12/2023 in [facility] - A Dept Of [place]. [facility] Office Visit from 07/21/2022 in [facility] - A Dept Of [place]. [facility] Office Visit from 06/22/2022 in [facility] - A Dept Of  [place]. [facility] Office Visit from 04/28/2022 in [facility] - A Dept Of [place]. [facility]  PHQ-2 Total Score 4 2 2 2 2   PHQ-9 Total Score 15 8 9 8 8    Flowsheet Row UC from 05/03/2024 in [facility]) ED from 01/26/2024 in [facility] Emergency Department at [facility] ED from 04/22/2023 in [facility] Emergency Department at [facility]  C-SSRS RISK CATEGORY No Risk No Risk No Risk    Collaboration of Care: Collaboration of Care: Medication Management AEB medication prescription, Primary Care Provider AEB chart review, and Other provider involved in patient's care AEB GI chart review  Patient/Guardian was advised Release of Information must be obtained prior to any record release in order to collaborate their care with an outside provider. Patient/Guardian was advised if they have not already done so to contact the registration department to sign all necessary forms in order for us  to release information regarding their care.   Consent: Patient/Guardian gives verbal consent for treatment and assignment of benefits for services provided during this visit. Patient/Guardian expressed understanding and agreed to proceed.    Arvella CHRISTELLA Finder, MD 06/23/2024, 11:32 AM   Virtual Visit via Video Note  I connected with Dana Knight on 06/23/2024 at 11:00 AM EST by a video enabled telemedicine application and verified that I am speaking with the correct person using two identifiers.  Location: Patient: Home Provider: Home Office   I discussed the limitations of evaluation and management by telemedicine and the availability of in person appointments. The patient expressed understanding and agreed to proceed.   I discussed the assessment and treatment plan with the patient. The patient was  provided an opportunity to ask questions and all were answered. The patient agreed with the plan and demonstrated an understanding of the instructions.   The patient was advised to call back or seek an in-person evaluation if the symptoms worsen or if the condition fails to improve as anticipated.  I provided 20 minutes of non-face-to-face time during this encounter.   Arvella CHRISTELLA Finder, MD

## 2024-06-21 ENCOUNTER — Other Ambulatory Visit (HOSPITAL_COMMUNITY): Payer: Self-pay | Admitting: Psychiatry

## 2024-06-21 DIAGNOSIS — F331 Major depressive disorder, recurrent, moderate: Secondary | ICD-10-CM

## 2024-06-21 DIAGNOSIS — F411 Generalized anxiety disorder: Secondary | ICD-10-CM

## 2024-06-23 ENCOUNTER — Encounter (HOSPITAL_COMMUNITY): Payer: Self-pay | Admitting: Psychiatry

## 2024-06-23 ENCOUNTER — Encounter: Payer: Self-pay | Admitting: Gastroenterology

## 2024-06-23 ENCOUNTER — Telehealth (HOSPITAL_COMMUNITY): Admitting: Psychiatry

## 2024-06-23 DIAGNOSIS — F331 Major depressive disorder, recurrent, moderate: Secondary | ICD-10-CM | POA: Diagnosis not present

## 2024-06-23 DIAGNOSIS — F411 Generalized anxiety disorder: Secondary | ICD-10-CM

## 2024-06-23 MED ORDER — SERTRALINE HCL 100 MG PO TABS: 100.0000 mg | ORAL_TABLET | Freq: Every day | ORAL | 0 refills | Status: AC

## 2024-06-23 MED ORDER — MIRTAZAPINE 15 MG PO TABS: 15.0000 mg | ORAL_TABLET | Freq: Every day | ORAL | 0 refills | Status: AC

## 2024-06-26 ENCOUNTER — Ambulatory Visit: Admitting: Family Medicine

## 2024-06-28 ENCOUNTER — Inpatient Hospital Stay: Payer: Self-pay | Attending: Genetic Counselor | Admitting: Genetic Counselor

## 2024-06-28 ENCOUNTER — Inpatient Hospital Stay: Payer: Self-pay

## 2024-06-28 ENCOUNTER — Telehealth: Payer: Self-pay | Admitting: Family Medicine

## 2024-06-28 NOTE — Telephone Encounter (Signed)
 Good Morning, courteous notification of patient being No Show for Genetic Referral Appointment today 06/28/2024 10:00am- we emailed friendly reminder of appt to her last week. When we attempted to call on yesterday, her voicemail was full. Thank you

## 2024-07-03 ENCOUNTER — Encounter: Payer: Self-pay | Admitting: Gastroenterology

## 2024-07-03 ENCOUNTER — Ambulatory Visit: Admitting: Gastroenterology

## 2024-07-03 VITALS — BP 147/87 | HR 68 | Temp 97.5°F | Resp 11 | Ht 65.0 in | Wt 176.0 lb

## 2024-07-03 DIAGNOSIS — Z1211 Encounter for screening for malignant neoplasm of colon: Secondary | ICD-10-CM

## 2024-07-03 DIAGNOSIS — K635 Polyp of colon: Secondary | ICD-10-CM | POA: Diagnosis not present

## 2024-07-03 DIAGNOSIS — D125 Benign neoplasm of sigmoid colon: Secondary | ICD-10-CM

## 2024-07-03 MED ORDER — SODIUM CHLORIDE 0.9 % IV SOLN: 500.0000 mL | Freq: Once | INTRAVENOUS | Status: DC

## 2024-07-03 NOTE — Progress Notes (Signed)
 Pt's states no medical or surgical changes since previsit or office visit.

## 2024-07-03 NOTE — Patient Instructions (Signed)
 YOU HAD AN ENDOSCOPIC PROCEDURE TODAY AT THE [facility]:   Refer to the procedure report that was given to you for any specific questions about what was found during the examination.  If the procedure report does not answer your questions, please call your gastroenterologist to clarify.  If you requested that your care partner not be given the details of your procedure findings, then the procedure report has been included in a sealed envelope for you to review at your convenience later.  YOU SHOULD EXPECT: Some feelings of bloating in the abdomen. Passage of more gas than usual.  Walking can help get rid of the air that was put into your GI tract during the procedure and reduce the bloating. If you had a lower endoscopy (such as a colonoscopy or flexible sigmoidoscopy) you may notice spotting of blood in your stool or on the toilet paper. If you underwent a bowel prep for your procedure, you may not have a normal bowel movement for a few days.  Please Note:  You might notice some irritation and congestion in your nose or some drainage.  This is from the oxygen used during your procedure.  There is no need for concern and it should clear up in a day or so.  SYMPTOMS TO REPORT IMMEDIATELY:  Following lower endoscopy (colonoscopy or flexible sigmoidoscopy):  Excessive amounts of blood in the stool  Significant tenderness or worsening of abdominal pains  Swelling of the abdomen that is new, acute  Fever of 100F or higher  Resume previous diet Continue present medications Await pathology results Repeat colonoscopy for surveillance based on pathology results Return to GI office as needed Handout on polyps given  For urgent or emergent issues, a gastroenterologist can be reached at any hour by calling [phone]. Do not use MyChart messaging for urgent concerns.    DIET:  We do recommend a small meal at first, but then you may proceed to your regular diet.  Drink plenty of fluids  but you should avoid alcoholic beverages for 24 hours.  ACTIVITY:  You should plan to take it easy for the rest of today and you should NOT DRIVE or use heavy machinery until tomorrow (because of the sedation medicines used during the test).    FOLLOW UP: Our staff will call the number listed on your records the next business day following your procedure.  We will call around 7:15- 8:00 am to check on you and address any questions or concerns that you may have regarding the information given to you following your procedure. If we do not reach you, we will leave a message.     If any biopsies were taken you will be contacted by phone or by letter within the next 1-3 weeks.  Please call us  at [phone] if you have not heard about the biopsies in 3 weeks.    SIGNATURES/CONFIDENTIALITY: You and/or your care partner have signed paperwork which will be entered into your electronic medical record.  These signatures attest to the fact that that the information above on your After Visit Summary has been reviewed and is understood.  Full responsibility of the confidentiality of this discharge information lies with you and/or your care-partner.

## 2024-07-03 NOTE — Progress Notes (Signed)
 "   GASTROENTEROLOGY PROCEDURE H&P NOTE   Primary Care Physician: Lonnie Earnest, MD    Reason for Procedure:  Colon Cancer screening  Plan:    Colonoscopy  Patient is appropriate for endoscopic procedure(s) in the ambulatory ([facility]) setting.  The nature of the procedure, as well as the risks, benefits, and alternatives were carefully and thoroughly reviewed with the patient. Ample time for discussion and questions allowed. The patient understood, was satisfied, and agreed to proceed. I personally addressed all patient questions and concerns.     HPI: Dana Knight is a 49 y.o. female who presents for colonoscopy for routine Colon Cancer screening.  No active GI symptoms.  No known family history of colon cancer or related malignancy.  Patient is otherwise without complaints or active issues today.  Past Medical History:  Diagnosis Date   Abnormal Pap smear    Anxiety    Bipolar affective disorder, current episode mixed (HCC) 04/06/2018   Contact dermatitis 07/05/2021   Ear pain 07/05/2021   Healthcare maintenance 12/23/2019   History of chlamydia    HSV (herpes simplex virus) anogenital infection    Loss of weight 11/30/2016   Lower abdominal pain 10/03/2014   No pertinent past medical history    Pap smear for cervical cancer screening 06/25/2019   Upper airway cough syndrome 09/03/2021    Past Surgical History:  Procedure Laterality Date   COLPOSCOPY  2010   DILATION AND CURETTAGE OF UTERUS     NO PAST SURGERIES      Prior to Admission medications  Medication Sig Start Date End Date Taking? Authorizing Provider  levonorgestrel  (MIRENA ) 20 MCG/24HR IUD 1 each by Intrauterine route once.   Yes [provider]  mirtazapine  (REMERON ) 15 MG tablet Take 1 tablet (15 mg total) by mouth at bedtime. 06/23/24  Yes Carvin Arvella HERO, MD  albuterol  (VENTOLIN  HFA) 108 [phone] Base) MCG/ACT inhaler Inhale 2 puffs into the lungs every 4 (four) hours as needed for wheezing or shortness  of breath. 09/01/21   Hope Merle, MD  cetirizine  (ZYRTEC ) 10 MG tablet TAKE 1 TABLET BY MOUTH EVERY DAY 12/09/22   Carlyon, Victoria J, DO  fluticasone  (FLONASE ) 50 MCG/ACT nasal spray Place 2 sprays into both nostrils daily. Patient not taking: Reported on 06/16/2024 07/02/21   Hope Merle, MD  hydrocortisone  1 % ointment Apply 1 application topically 2 (two) times daily. Patient not taking: Reported on 06/16/2024 07/02/21   Hope Merle, MD  ondansetron  (ZOFRAN -ODT) 4 MG disintegrating tablet Take 1 tablet (4 mg total) by mouth every 8 (eight) hours as needed for nausea or vomiting. 01/26/24   Doretha Folks, MD  sertraline  (ZOLOFT ) 100 MG tablet Take 1 tablet (100 mg total) by mouth daily. 06/23/24 06/23/25  Carvin Arvella HERO, MD    Current Outpatient Medications  Medication Sig Dispense Refill   levonorgestrel  (MIRENA ) 20 MCG/24HR IUD 1 each by Intrauterine route once.     mirtazapine  (REMERON ) 15 MG tablet Take 1 tablet (15 mg total) by mouth at bedtime. 90 tablet 0   albuterol  (VENTOLIN  HFA) 108 (90 Base) MCG/ACT inhaler Inhale 2 puffs into the lungs every 4 (four) hours as needed for wheezing or shortness of breath. 8 g 2   cetirizine  (ZYRTEC ) 10 MG tablet TAKE 1 TABLET BY MOUTH EVERY DAY 30 tablet 11   fluticasone  (FLONASE ) 50 MCG/ACT nasal spray Place 2 sprays into both nostrils daily. (Patient not taking: Reported on 06/16/2024) 16 g 6   hydrocortisone  1 %  ointment Apply 1 application topically 2 (two) times daily. (Patient not taking: Reported on 06/16/2024) 30 g 0   ondansetron  (ZOFRAN -ODT) 4 MG disintegrating tablet Take 1 tablet (4 mg total) by mouth every 8 (eight) hours as needed for nausea or vomiting. 10 tablet 0   sertraline  (ZOLOFT ) 100 MG tablet Take 1 tablet (100 mg total) by mouth daily. 90 tablet 0   Current Facility-Administered Medications  Medication Dose Route Frequency Provider Last Rate Last Admin   0.9 %  sodium chloride  infusion  500 mL Intravenous Once Lachell Rochette V, DO         Allergies as of 07/03/2024 - Review Complete 07/03/2024  Allergen Reaction Noted   Diflucan  [fluconazole ] Swelling 02/09/2018    Family History  Problem Relation Age of Onset   Stroke Mother    Diabetes Father    Thyroid  disease Maternal Aunt    Diabetes Maternal Grandmother    Breast cancer Neg Hx    Colon cancer Neg Hx    Esophageal cancer Neg Hx    Stomach cancer Neg Hx    Rectal cancer Neg Hx     Social History   Socioeconomic History   Marital status: Single    Spouse name: Not on file   Number of children: Not on file   Years of education: Not on file   Highest education level: Not on file  Occupational History   Not on file  Tobacco Use   Smoking status: Never    Passive exposure: Never   Smokeless tobacco: Never  Vaping Use   Vaping status: Never Used  Substance and Sexual Activity   Alcohol use: Yes    Comment: occasional   Drug use: No   Sexual activity: Never    Birth control/protection: I.U.D.  Other Topics Concern   Not on file  Social History Narrative         Social Drivers of Health   Tobacco Use: Low Risk (07/03/2024)   Patient History    Smoking Tobacco Use: Never    Smokeless Tobacco Use: Never    Passive Exposure: Never  Financial Resource Strain: Not on file  Food Insecurity: No Food Insecurity (07/17/2021)   Hunger Vital Sign    Worried About Running Out of Food in the Last Year: Never true    Ran Out of Food in the Last Year: Never true  Transportation Needs: No Transportation Needs (07/17/2021)   PRAPARE - Administrator, Civil Service (Medical): No    Lack of Transportation (Non-Medical): No  Physical Activity: Not on file  Stress: Stress Concern Present (07/17/2021)   [employer] of Occupational Health - Occupational Stress Questionnaire    Feeling of Stress : Very much  Social Connections: Not on file  Intimate Partner Violence: Not on file  Depression (PHQ2-9): High Risk (04/18/2024)   Depression  (PHQ2-9)    PHQ-2 Score: 15  Alcohol Screen: Not on file  Housing: Low Risk (07/17/2021)   Housing    Last Housing Risk Score: 0  Utilities: Not on file  Health Literacy: Not on file    Physical Exam: Vital signs in last 24 hours: @BP  124/82   Pulse 70   Temp (!) 97.5 F (36.4 C) (Skin)   Ht 5' 5 (1.651 m)   Wt 176 lb (79.8 kg)   SpO2 100%   BMI 29.29 kg/m  GEN: NAD EYE: Sclerae anicteric ENT: MMM CV: Non-tachycardic Pulm: CTA b/l GI: Soft, NT/ND NEURO:  Alert &  Oriented x 3   Sandor Flatter, DO [facility]   07/03/2024 11:12 AM  "

## 2024-07-03 NOTE — Op Note (Signed)
 [facility] Patient Name: Dana Knight Procedure Date: 07/03/2024 11:05 AM MRN: [id number] Endoscopist: Sandor Flatter , MD, [id number] Age: 49 Referring MD:  Date of Birth: Feb 01, 1976 Gender: Female Account #: [id number] Procedure:                Colonoscopy Indications:              Screening for colorectal malignant neoplasm, This                            is the patient's first colonoscopy Medicines:                Monitored Anesthesia Care Procedure:                Pre-Anesthesia Assessment:                           - Prior to the procedure, a History and Physical                            was performed, and patient medications and                            allergies were reviewed. The patient's tolerance of                            previous anesthesia was also reviewed. The risks                            and benefits of the procedure and the sedation                            options and risks were discussed with the patient.                            All questions were answered, and informed consent                            was obtained. Prior Anticoagulants: The patient has                            taken no anticoagulant or antiplatelet agents. ASA                            Grade Assessment: II - A patient with mild systemic                            disease. After reviewing the risks and benefits,                            the patient was deemed in satisfactory condition to                            undergo the procedure.  After obtaining informed consent, the colonoscope                            was passed under direct vision. Throughout the                            procedure, the patient's blood pressure, pulse, and                            oxygen saturations were monitored continuously. The                            Olympus Scope SN: [id number] was introduced through                            the anus and advanced  to the the terminal ileum.                            The colonoscopy was performed without difficulty.                            The patient tolerated the procedure well. The                            quality of the bowel preparation was good. The                            terminal ileum, ileocecal valve, appendiceal                            orifice, and rectum were photographed. Scope In: 11:16:40 AM Scope Out: 11:33:26 AM Scope Withdrawal Time: 0 hours 13 minutes 15 seconds  Total Procedure Duration: 0 hours 16 minutes 46 seconds  Findings:                 The perianal and digital rectal examinations were                            normal.                           Three sessile polyps were found in the sigmoid                            colon. The polyps were 3 to 5 mm in size. These                            polyps were removed with a cold snare. Resection                            and retrieval were complete. Estimated blood loss                            was minimal.  The exam was otherwise normal throughout the                            remainder of the colon.                           The retroflexed view of the distal rectum and anal                            verge was normal and showed no anal or rectal                            abnormalities.                           The terminal ileum appeared normal. Complications:            No immediate complications. Estimated Blood Loss:     Estimated blood loss was minimal. Impression:               - Three 3 to 5 mm polyps in the sigmoid colon,                            removed with a cold snare. Resected and retrieved.                           - The distal rectum and anal verge are normal on                            retroflexion view.                           - The examined portion of the ileum was normal. Recommendation:           - Patient has a contact number available for                             emergencies. The signs and symptoms of potential                            delayed complications were discussed with the                            patient. Return to normal activities tomorrow.                            Written discharge instructions were provided to the                            patient.                           - Resume previous diet.                           - Continue present medications.                           -  Await pathology results.                           - Repeat colonoscopy for surveillance based on                            pathology results.                           - Return to GI office PRN. Sandor Flatter, MD 07/03/2024 11:37:30 AM

## 2024-07-03 NOTE — Progress Notes (Signed)
 Called to room to assist during endoscopic procedure.  Patient ID and intended procedure confirmed with present staff. Received instructions for my participation in the procedure from the performing physician.

## 2024-07-03 NOTE — Progress Notes (Signed)
 Sedate, gd SR, tolerated procedure well, VSS, report to RN

## 2024-07-04 ENCOUNTER — Telehealth: Payer: Self-pay

## 2024-07-04 NOTE — Telephone Encounter (Signed)
" °  Follow up Call-     07/03/2024   10:37 AM  Call back number  Post procedure Call Back phone  # [phone]  Permission to leave phone message Yes     Patient questions:  Do you have a fever, pain , or abdominal swelling? No. Pain Score  0 *  Have you tolerated food without any problems? Yes.    Have you been able to return to your normal activities? Yes.    Do you have any questions about your discharge instructions: Diet   No. Medications  No. Follow up visit  No.  Do you have questions or concerns about your Care? No.  Actions: * If pain score is 4 or above: No action needed, pain <4.  Patient expressed concerns about some cramping she had overnight. States she is still passing gas and has not had it anymore. Denies any cramping or discomfort at the moment of this call. Instructed to keep monitoring and call office if at any moment she feels any symptoms get worse. Patient verbalized understanding.    "

## 2024-07-05 ENCOUNTER — Other Ambulatory Visit: Payer: Self-pay | Admitting: Nurse Practitioner

## 2024-07-05 ENCOUNTER — Ambulatory Visit
Admission: RE | Admit: 2024-07-05 | Discharge: 2024-07-05 | Disposition: A | Source: Ambulatory Visit | Attending: Nurse Practitioner | Admitting: Nurse Practitioner

## 2024-07-05 DIAGNOSIS — R52 Pain, unspecified: Secondary | ICD-10-CM

## 2024-07-06 LAB — SURGICAL PATHOLOGY

## 2024-07-10 ENCOUNTER — Inpatient Hospital Stay: Admitting: Genetic Counselor

## 2024-07-10 ENCOUNTER — Inpatient Hospital Stay

## 2024-07-11 ENCOUNTER — Ambulatory Visit: Payer: Self-pay | Admitting: Gastroenterology

## 2024-07-21 ENCOUNTER — Other Ambulatory Visit: Payer: Self-pay | Admitting: Family Medicine

## 2024-07-21 DIAGNOSIS — F411 Generalized anxiety disorder: Secondary | ICD-10-CM

## 2024-07-21 DIAGNOSIS — F331 Major depressive disorder, recurrent, moderate: Secondary | ICD-10-CM

## 2024-07-24 ENCOUNTER — Inpatient Hospital Stay: Payer: Self-pay

## 2024-08-21 ENCOUNTER — Inpatient Hospital Stay: Payer: Self-pay

## 2024-08-31 ENCOUNTER — Telehealth (HOSPITAL_COMMUNITY): Payer: Self-pay | Admitting: Psychiatry
